# Patient Record
Sex: Female | Born: 1975 | Race: White | Hispanic: No | Smoking: Former smoker
Health system: Southern US, Community
[De-identification: ages and names within clinical notes are randomized; demographics above are authoritative.]

## PROBLEM LIST (undated history)

## (undated) DIAGNOSIS — Z803 Family history of malignant neoplasm of breast: Secondary | ICD-10-CM

## (undated) DIAGNOSIS — F3181 Bipolar II disorder: Secondary | ICD-10-CM

## (undated) DIAGNOSIS — K259 Gastric ulcer, unspecified as acute or chronic, without hemorrhage or perforation: Secondary | ICD-10-CM

## (undated) DIAGNOSIS — F32A Depression, unspecified: Secondary | ICD-10-CM

## (undated) DIAGNOSIS — J45909 Unspecified asthma, uncomplicated: Secondary | ICD-10-CM

## (undated) DIAGNOSIS — G43909 Migraine, unspecified, not intractable, without status migrainosus: Secondary | ICD-10-CM

## (undated) DIAGNOSIS — E236 Other disorders of pituitary gland: Secondary | ICD-10-CM

## (undated) DIAGNOSIS — Z8041 Family history of malignant neoplasm of ovary: Secondary | ICD-10-CM

## (undated) DIAGNOSIS — R569 Unspecified convulsions: Secondary | ICD-10-CM

## (undated) DIAGNOSIS — K760 Fatty (change of) liver, not elsewhere classified: Secondary | ICD-10-CM

## (undated) HISTORY — PX: CHOLECYSTECTOMY: SHX55

## (undated) HISTORY — DX: Family history of malignant neoplasm of ovary: Z80.41

## (undated) HISTORY — PX: ABDOMINAL HYSTERECTOMY: SHX81

## (undated) HISTORY — DX: Family history of malignant neoplasm of breast: Z80.3

---

## 1998-09-23 ENCOUNTER — Encounter: Payer: Self-pay | Admitting: Obstetrics and Gynecology

## 1998-09-23 ENCOUNTER — Inpatient Hospital Stay (HOSPITAL_COMMUNITY): Admission: AD | Admit: 1998-09-23 | Discharge: 1998-10-01 | Payer: Self-pay | Admitting: Obstetrics

## 1998-09-26 ENCOUNTER — Encounter: Payer: Self-pay | Admitting: Obstetrics and Gynecology

## 1998-09-27 ENCOUNTER — Encounter: Payer: Self-pay | Admitting: Obstetrics and Gynecology

## 1998-10-01 ENCOUNTER — Encounter (HOSPITAL_COMMUNITY): Admission: RE | Admit: 1998-10-01 | Discharge: 1998-12-30 | Payer: Self-pay | Admitting: Obstetrics and Gynecology

## 1998-11-17 ENCOUNTER — Other Ambulatory Visit: Admission: RE | Admit: 1998-11-17 | Discharge: 1998-11-17 | Payer: Self-pay | Admitting: Obstetrics and Gynecology

## 1999-12-11 ENCOUNTER — Ambulatory Visit (HOSPITAL_COMMUNITY): Admission: RE | Admit: 1999-12-11 | Discharge: 1999-12-11 | Payer: Self-pay | Admitting: Obstetrics and Gynecology

## 2000-08-16 ENCOUNTER — Other Ambulatory Visit: Admission: RE | Admit: 2000-08-16 | Discharge: 2000-08-16 | Payer: Self-pay | Admitting: Obstetrics and Gynecology

## 2001-09-25 ENCOUNTER — Encounter: Admission: RE | Admit: 2001-09-25 | Discharge: 2001-09-25 | Payer: Self-pay | Admitting: Obstetrics and Gynecology

## 2001-09-25 ENCOUNTER — Encounter: Payer: Self-pay | Admitting: Obstetrics and Gynecology

## 2004-06-10 ENCOUNTER — Encounter (INDEPENDENT_AMBULATORY_CARE_PROVIDER_SITE_OTHER): Payer: Self-pay | Admitting: Specialist

## 2004-06-10 ENCOUNTER — Observation Stay (HOSPITAL_COMMUNITY): Admission: RE | Admit: 2004-06-10 | Discharge: 2004-06-11 | Payer: Self-pay | Admitting: Obstetrics and Gynecology

## 2005-03-15 ENCOUNTER — Emergency Department: Payer: Self-pay | Admitting: Emergency Medicine

## 2005-04-26 ENCOUNTER — Emergency Department: Payer: Self-pay | Admitting: General Practice

## 2006-04-12 ENCOUNTER — Emergency Department: Payer: Self-pay | Admitting: Unknown Physician Specialty

## 2006-04-12 ENCOUNTER — Other Ambulatory Visit: Payer: Self-pay

## 2006-04-13 ENCOUNTER — Ambulatory Visit: Payer: Self-pay | Admitting: Emergency Medicine

## 2006-05-13 ENCOUNTER — Ambulatory Visit: Payer: Self-pay | Admitting: General Surgery

## 2006-11-14 ENCOUNTER — Ambulatory Visit: Payer: Self-pay | Admitting: Unknown Physician Specialty

## 2006-11-17 ENCOUNTER — Ambulatory Visit: Payer: Self-pay | Admitting: Unknown Physician Specialty

## 2007-07-23 ENCOUNTER — Emergency Department (HOSPITAL_COMMUNITY): Admission: EM | Admit: 2007-07-23 | Discharge: 2007-07-23 | Payer: Self-pay | Admitting: Emergency Medicine

## 2007-11-13 ENCOUNTER — Ambulatory Visit: Payer: Self-pay | Admitting: Internal Medicine

## 2007-12-21 ENCOUNTER — Emergency Department (HOSPITAL_COMMUNITY): Admission: EM | Admit: 2007-12-21 | Discharge: 2007-12-22 | Payer: Self-pay | Admitting: Emergency Medicine

## 2008-02-13 ENCOUNTER — Emergency Department (HOSPITAL_COMMUNITY): Admission: EM | Admit: 2008-02-13 | Discharge: 2008-02-14 | Payer: Self-pay | Admitting: Emergency Medicine

## 2008-03-21 ENCOUNTER — Emergency Department (HOSPITAL_COMMUNITY): Admission: EM | Admit: 2008-03-21 | Discharge: 2008-03-21 | Payer: Self-pay | Admitting: Emergency Medicine

## 2008-07-14 ENCOUNTER — Emergency Department (HOSPITAL_COMMUNITY): Admission: EM | Admit: 2008-07-14 | Discharge: 2008-07-14 | Payer: Self-pay | Admitting: Emergency Medicine

## 2008-10-16 ENCOUNTER — Ambulatory Visit: Payer: Self-pay | Admitting: Unknown Physician Specialty

## 2008-10-17 ENCOUNTER — Ambulatory Visit: Payer: Self-pay | Admitting: Unknown Physician Specialty

## 2008-11-21 ENCOUNTER — Emergency Department (HOSPITAL_COMMUNITY): Admission: EM | Admit: 2008-11-21 | Discharge: 2008-11-21 | Payer: Self-pay | Admitting: Family Medicine

## 2009-01-09 ENCOUNTER — Emergency Department: Payer: Self-pay | Admitting: Emergency Medicine

## 2009-01-23 ENCOUNTER — Ambulatory Visit: Payer: Self-pay | Admitting: Unknown Physician Specialty

## 2009-03-24 ENCOUNTER — Ambulatory Visit: Payer: Self-pay | Admitting: Family Medicine

## 2009-03-24 DIAGNOSIS — F509 Eating disorder, unspecified: Secondary | ICD-10-CM

## 2009-03-24 DIAGNOSIS — R5381 Other malaise: Secondary | ICD-10-CM

## 2009-03-24 DIAGNOSIS — G43909 Migraine, unspecified, not intractable, without status migrainosus: Secondary | ICD-10-CM | POA: Insufficient documentation

## 2009-03-24 DIAGNOSIS — F411 Generalized anxiety disorder: Secondary | ICD-10-CM | POA: Insufficient documentation

## 2009-03-24 DIAGNOSIS — K219 Gastro-esophageal reflux disease without esophagitis: Secondary | ICD-10-CM

## 2009-03-24 DIAGNOSIS — R5383 Other fatigue: Secondary | ICD-10-CM

## 2009-03-24 DIAGNOSIS — F329 Major depressive disorder, single episode, unspecified: Secondary | ICD-10-CM

## 2009-03-24 DIAGNOSIS — K259 Gastric ulcer, unspecified as acute or chronic, without hemorrhage or perforation: Secondary | ICD-10-CM | POA: Insufficient documentation

## 2009-03-24 LAB — CONVERTED CEMR LAB
ALT: 22 units/L (ref 0–35)
AST: 22 units/L (ref 0–37)
Alkaline Phosphatase: 45 units/L (ref 39–117)
Basophils Absolute: 0 10*3/uL (ref 0.0–0.1)
Eosinophils Absolute: 0.1 10*3/uL (ref 0.0–0.7)
HCT: 36.9 % (ref 36.0–46.0)
HDL: 46.4 mg/dL (ref >39.00)
LDL Cholesterol: 96 mg/dL (ref 0–99)
Lymphs Abs: 2 10*3/uL (ref 0.7–4.0)
MCV: 87.6 fL (ref 78.0–100.0)
Monocytes Absolute: 0.6 10*3/uL (ref 0.1–1.0)
Neutrophils Relative %: 51 % (ref 43.0–77.0)
Platelets: 171 10*3/uL (ref 150.0–400.0)
RDW: 11.9 % (ref 11.5–14.6)
Sodium: 145 meq/L (ref 135–145)
TSH: 0.97 microintl units/mL (ref 0.35–5.50)
Total Bilirubin: 0.5 mg/dL (ref 0.3–1.2)
Total CHOL/HDL Ratio: 3
Total Protein: 7.7 g/dL (ref 6.0–8.3)
VLDL: 5.6 mg/dL (ref 0.0–40.0)

## 2009-05-31 ENCOUNTER — Ambulatory Visit: Payer: Self-pay | Admitting: Family Medicine

## 2009-06-11 ENCOUNTER — Telehealth: Payer: Self-pay | Admitting: Family Medicine

## 2009-07-07 ENCOUNTER — Telehealth: Payer: Self-pay | Admitting: Family Medicine

## 2009-07-09 ENCOUNTER — Telehealth: Payer: Self-pay | Admitting: Family Medicine

## 2009-07-21 ENCOUNTER — Telehealth: Payer: Self-pay | Admitting: Family Medicine

## 2009-09-26 ENCOUNTER — Ambulatory Visit: Payer: Self-pay | Admitting: Family Medicine

## 2009-09-26 LAB — CONVERTED CEMR LAB: Rapid Strep: NEGATIVE

## 2009-10-20 ENCOUNTER — Telehealth: Payer: Self-pay | Admitting: Family Medicine

## 2009-11-18 ENCOUNTER — Telehealth: Payer: Self-pay | Admitting: Family Medicine

## 2009-11-19 ENCOUNTER — Ambulatory Visit: Payer: Self-pay | Admitting: Family Medicine

## 2009-11-21 ENCOUNTER — Telehealth: Payer: Self-pay | Admitting: Family Medicine

## 2009-11-24 ENCOUNTER — Ambulatory Visit: Payer: Self-pay | Admitting: Family Medicine

## 2009-12-02 ENCOUNTER — Encounter: Payer: Self-pay | Admitting: Family Medicine

## 2010-04-10 ENCOUNTER — Emergency Department (HOSPITAL_COMMUNITY): Admission: EM | Admit: 2010-04-10 | Discharge: 2010-04-10 | Payer: Self-pay | Admitting: Family Medicine

## 2010-04-13 ENCOUNTER — Ambulatory Visit: Payer: Self-pay | Admitting: Family Medicine

## 2010-04-22 ENCOUNTER — Ambulatory Visit: Payer: Self-pay | Admitting: Family Medicine

## 2010-04-23 ENCOUNTER — Ambulatory Visit: Payer: Self-pay | Admitting: Family Medicine

## 2010-04-23 ENCOUNTER — Other Ambulatory Visit: Admission: RE | Admit: 2010-04-23 | Discharge: 2010-04-23 | Payer: Self-pay | Admitting: Family Medicine

## 2010-04-23 LAB — CONVERTED CEMR LAB: Hep B C IgM: NEGATIVE

## 2010-04-24 ENCOUNTER — Encounter: Payer: Self-pay | Admitting: Family Medicine

## 2010-04-27 ENCOUNTER — Encounter (INDEPENDENT_AMBULATORY_CARE_PROVIDER_SITE_OTHER): Payer: Self-pay | Admitting: *Deleted

## 2010-04-28 ENCOUNTER — Telehealth: Payer: Self-pay | Admitting: Family Medicine

## 2010-04-29 ENCOUNTER — Ambulatory Visit: Payer: Self-pay | Admitting: Family Medicine

## 2010-04-30 ENCOUNTER — Telehealth: Payer: Self-pay | Admitting: Family Medicine

## 2010-05-14 ENCOUNTER — Telehealth: Payer: Self-pay | Admitting: Family Medicine

## 2010-06-18 ENCOUNTER — Telehealth: Payer: Self-pay | Admitting: Family Medicine

## 2010-06-23 ENCOUNTER — Encounter: Payer: Self-pay | Admitting: Family Medicine

## 2010-07-01 ENCOUNTER — Telehealth: Payer: Self-pay | Admitting: Family Medicine

## 2010-07-06 ENCOUNTER — Ambulatory Visit: Payer: Self-pay | Admitting: Internal Medicine

## 2010-07-06 ENCOUNTER — Encounter: Payer: Self-pay | Admitting: Family Medicine

## 2010-08-13 ENCOUNTER — Telehealth: Payer: Self-pay | Admitting: Family Medicine

## 2010-08-19 ENCOUNTER — Ambulatory Visit: Payer: Self-pay | Admitting: Family Medicine

## 2010-08-27 ENCOUNTER — Ambulatory Visit: Payer: Self-pay | Admitting: Family Medicine

## 2010-08-27 DIAGNOSIS — G4700 Insomnia, unspecified: Secondary | ICD-10-CM | POA: Insufficient documentation

## 2010-09-22 ENCOUNTER — Telehealth: Payer: Self-pay | Admitting: Family Medicine

## 2010-10-15 ENCOUNTER — Emergency Department: Payer: Self-pay | Admitting: Internal Medicine

## 2010-11-02 ENCOUNTER — Telehealth: Payer: Self-pay | Admitting: Family Medicine

## 2010-11-03 ENCOUNTER — Telehealth (INDEPENDENT_AMBULATORY_CARE_PROVIDER_SITE_OTHER): Payer: Self-pay | Admitting: *Deleted

## 2010-11-09 ENCOUNTER — Encounter: Payer: Self-pay | Admitting: Family Medicine

## 2010-11-09 ENCOUNTER — Telehealth (INDEPENDENT_AMBULATORY_CARE_PROVIDER_SITE_OTHER): Payer: Self-pay | Admitting: *Deleted

## 2010-11-09 ENCOUNTER — Ambulatory Visit: Admit: 2010-11-09 | Payer: Self-pay | Admitting: Family Medicine

## 2010-11-12 ENCOUNTER — Ambulatory Visit: Admit: 2010-11-12 | Payer: Self-pay | Admitting: Family Medicine

## 2010-11-17 ENCOUNTER — Telehealth (INDEPENDENT_AMBULATORY_CARE_PROVIDER_SITE_OTHER): Payer: Self-pay | Admitting: *Deleted

## 2010-11-17 ENCOUNTER — Telehealth: Payer: Self-pay | Admitting: Family Medicine

## 2010-11-18 ENCOUNTER — Ambulatory Visit: Payer: Self-pay | Admitting: Family Medicine

## 2010-11-18 DIAGNOSIS — Z0289 Encounter for other administrative examinations: Secondary | ICD-10-CM

## 2010-11-19 ENCOUNTER — Encounter: Payer: Self-pay | Admitting: Family Medicine

## 2010-11-19 ENCOUNTER — Ambulatory Visit: Payer: 59 | Admitting: Family Medicine

## 2010-11-19 DIAGNOSIS — F329 Major depressive disorder, single episode, unspecified: Secondary | ICD-10-CM

## 2010-11-19 DIAGNOSIS — M549 Dorsalgia, unspecified: Secondary | ICD-10-CM

## 2010-11-19 DIAGNOSIS — F411 Generalized anxiety disorder: Secondary | ICD-10-CM

## 2010-11-19 NOTE — Miscellaneous (Signed)
Summary: [facility]  [facility]   Imported By: Lanelle Bal 12/08/2009 13:30:08  _____________________________________________________________________  External Attachment:    Type:   Image     Comment:   External Document  Appended Document: [facility] Please fax to Dr. Glean Hess from [facility] who appears to be the ordering physician  Appended Document: [facility] done faxed to [facility]

## 2010-11-19 NOTE — Progress Notes (Signed)
Summary: refill  Phone Note Refill Request Message from:  Scriptline on June 18, 2010 8:14 AM  Refills Requested: Medication #1:  AMITRIPTYLINE HCL 100 MG TABS 1 by mouth at bedtime   [facility]ly Requested[facility] [address]   Method Requested: Electronic Initial call taken by: Benny Lennert CMA (AAMA),  June 18, 2010 8:15 AM    Prescriptions: AMITRIPTYLINE HCL 100 MG TABS (AMITRIPTYLINE HCL) 1 by mouth at bedtime  #30 x 5   Entered and Authorized by:   Hannah Beat MD   Signed by:   Hannah Beat MD on 06/18/2010   Method used:   [facility]  [address] [phone][facility] (retail[facility]       [address]       [place], [place]  [address]       Ph: [phone] or [phone]       Fax: [phone]   RxID:   [id number]
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
## 2010-11-19 NOTE — Progress Notes (Signed)
Summary: refill reqeust for valtrex  Phone Note Refill Request Call back at Home Phone [phone] Message from:  Patient on November 02, 2010 10:05 AM  Refills Requested: Medication #1:  VALACYCLOVIR HCL 1 GM TABS 1 by mouth two times a day [facility] [address]   Initial call taken by: Melody Comas,  November 02, 2010 10:05 AM    Prescriptions: VALACYCLOVIR HCL 1 GM TABS (VALACYCLOVIR HCL) 1 by mouth two times a day  #20 x 0   Entered by:   Benny Lennert CMA (AAMA)   Authorized by:   Kerby Nora MD   Signed by:   Benny Lennert CMA (AAMA) on 11/02/2010   Method used:   Faxed to ...       [facility] (retail)       [address]       [place], [place]    [place]       Ph: [phone]       Fax: [phone]   RxID:   [id number] VALACYCLOVIR HCL 1 GM TABS (VALACYCLOVIR HCL) 1 by mouth two times a day  #20 x 0   Entered by:   Benny Lennert CMA (AAMA)   Authorized by:   Kerby Nora MD   Signed by:   Benny Lennert CMA (AAMA) on 11/02/2010   Method used:   [facility]        [facility]  [address] [phone][facility] (retail)       [address]       [place], [place]  [address]       Ph: [phone] or [phone]       Fax: [phone]   RxID:   [id number]

## 2010-11-19 NOTE — Letter (Signed)
Summary: Out of Work  [employer] [employer] [facility]  [address] [place], [place] [address]   Phone: [phone]  Fax: [phone]    July 06, 2010   Employee:  Dhanvi N Gilkes    To Whom It May Concern:   For Medical reasons, please excuse the above named employee from work for the following dates:  Start:  July 06, 2010   End:  July 06, 2010   If you need additional information, please feel free to contact our office.         Sincerely,    Eustaquio Boyden  MD

## 2010-11-19 NOTE — Progress Notes (Signed)
Summary: pt requests another phone call  Phone Note Call from Patient Call back at [phone]   Caller: Patient Call For: Hannah Beat MD Summary of Call: Pt is asking if you were going to call anything in to the pharmacy for her.  Says she is in a lot of pain, please call pt again to discuss. Initial call taken by: Lowella Petties CMA,  April 28, 2010 9:35 AM  Follow-up for Phone Call        We can try some Valtrex -- it may be too late since the onset of the outbreak was a number of days ago, but it is worth a shot.  Follow-up by: Hannah Beat MD,  April 28, 2010 9:39 AM  Additional Follow-up for Phone Call Additional follow up Details #1::        Patient advised via voicemail.Consuello Masse CMA   Additional Follow-up by: Benny Lennert CMA Duncan Dull),  April 28, 2010 9:54 AM    New/Updated Medications: VALACYCLOVIR HCL 1 GM TABS (VALACYCLOVIR HCL) 1 by mouth two times a day Prescriptions: VALACYCLOVIR HCL 1 GM TABS (VALACYCLOVIR HCL) 1 by mouth two times a day  #20 x 0   Entered and Authorized by:   Hannah Beat MD   Signed by:   Hannah Beat MD on 04/28/2010   Method used:   Telephoned to ...       [facility]  [address] [phone][facility] (retail)       [address]       [place], [place]  [address]       Ph: [phone] or [phone]       Fax: [phone]   RxID:   [id number]

## 2010-11-19 NOTE — Progress Notes (Signed)
Summary: clonazepam not helping  Phone Note Call from Patient Call back at [facility] Phone [phone]   Caller: Patient Call For: Hannah Beat MD Summary of Call: Patient says that the clonazepam is not helping her at all, she says that she can't even tell that she is taking anything. She is asking if she can get something else called [facility] [address]  Initial call taken by: Melody Comas,  November 09, 2010 10:00 AM  Follow-up for Phone Call        please have the patient follow-up with me in the office. It is ok to take 2 by mouth two times a day for now Follow-up by: Hannah Beat MD,  November 09, 2010 10:03 AM  Additional Follow-up for Phone Call Additional follow up Details #1::        Patient advised via message on machine.Consuello Masse CMA   Additional Follow-up by: Benny Lennert CMA Duncan Dull),  November 09, 2010 10:07 AM

## 2010-11-19 NOTE — Progress Notes (Signed)
Summary: Tingling in hands and face, dizziness  Phone Note Call from Patient Call back at [phone]   Caller: Patient Call For: Dr Ermalene Searing Summary of Call: 11/13/09 had N&V  Now keeping food down on and off since [place]. Pt is drinking normally.11/16/09 pt started with dizziness and today still dizzy with tingling in face and hands also h/a. Pt has no chest pain and no fever. Pt wanted an appt for today and did not want to consider an appt for tomorrow unless a physician said was OK. Dr. Ermalene Searing said with medication list pt is on and pt's age and pt can tolerate liquids Dr. Ermalene Searing said pt would be OK to wait until tomorrow to be seen. Pt has appt 11/19/09 at 12noon with Dr Dayton Martes and pt will call if condition changes or worsens. Initial call taken by: Melody Comas,  November 18, 2009 2:14 PM

## 2010-11-19 NOTE — Progress Notes (Signed)
Summary: refill request for tussionex  Phone Note Refill Request Message from:  Fax from Pharmacy  Refills Requested: Medication #1:  Sandria Senter ER 8-10 MG/5ML LQCR 1 tsp at bedtime for cough as needed. [facility] [address], pt requests generic.  Initial call taken by: Lowella Petties CMA,  October 20, 2009 9:07 AM  Follow-up for Phone Call        If she is not improving..she needs reeval in OV...if only slow to resolve cough and NO SOB...can refill x 1 Follow-up by: Kerby Nora MD,  October 20, 2009 9:08 AM  Additional Follow-up for Phone Call Additional follow up Details #1::        unable to contact patient but will advise if she returns my phone calls Additional Follow-up by: Benny Lennert CMA (AAMA),  October 20, 2009 11:22 AM
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
## 2010-11-19 NOTE — Letter (Signed)
Summary: Results Follow up Letter  [place] [facility]  [address] [place], [place] [address]   Phone: [phone]  Fax: [phone]    04/27/2010 MRN: [id number]     Andrea Hood [address] [place], [place]  [address]  Dear Ms. Grandison,  The following are the results of your recent test(s):    Test         Result    Pap Smear:        Normal __x___  Not Normal _____ Comments:Repeat in one year ______________________________________________________ Cholesterol: LDL(Bad cholesterol):         Your goal is less than:         HDL (Good cholesterol):       Your goal is more than: Comments:  ______________________________________________________ Mammogram:        Normal _____  Not Normal _____ Comments:  ___________________________________________________________________ Hemoccult:        Normal _____  Not normal _______ Comments:    _____________________________________________________________________ Other Tests:Still waiting for these to come back    We routinely do not discuss normal results over the telephone.  If you desire a copy of the results, or you have any questions about this information we can discuss them at your next office visit.   Sincerely,  Hannah Beat MD

## 2010-11-19 NOTE — Progress Notes (Signed)
Summary: pt left cell number  Phone Note Call from Patient   Caller: Patient Call For: Hannah Beat MD Summary of Call: Pt was told to call today and leave her cell phone number  so that you can call her from home.  That number is [phone]. Initial call taken by: Lowella Petties CMA,  April 28, 2010 9:11 AM  Follow-up for Phone Call        d/w her on the phone Follow-up by: Hannah Beat MD,  April 28, 2010 9:18 AM     Past Medical History:    Reviewed history from 03/24/2009 and no changes required:       MIGRAINE HEADACHE        GASTRIC ULCER, h/o       GERD        EATING DISORDER, UNSPECIFIED (h/o Bulimia several years ago)       DEPRESSION       ANEMIA       Anxiety       HSV 2 positive
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
## 2010-11-19 NOTE — Assessment & Plan Note (Signed)
Summary: TINGLING FACE & HANDS,DIZZY PER DR BEDSOLE/RI   Vital Signs:  Patient profile:   35 year old female Height:      64 inches Weight:      141.25 pounds BMI:     24.33 Temp:     98.2 degrees F oral Pulse rate:   80 / minute Pulse rhythm:   regular BP sitting:   102 / 80  (left arm) Cuff size:   regular  Vitals Entered By: Delilah Shan CMA Duncan Dull) (November 19, 2009 11:53 AM) CC: Tingling in face and hands.  Dizzy   History of Present Illness: 35 yo here with dizziness.  Started vomiting on Thursday, had body aches.  Vomitted several times a day until Monday.  Now has very watery diarrhea.  Trying to eat and drink but appetite is low. Noticed yesterday when she tried to stand up from seated positiong, she felt dizzy and tingling in her lips and hands.  Never passed out. Still a little nauseated but better. No blood in stool or emesis. Mutliple people at work have similar symptoms.  Current Medications (verified): 1)  Citalopram Hydrobromide 10 Mg Tabs (Citalopram Hydrobromide) .Marland Kitchen.. 1 By Mouth Daily 2)  Metaxalone 800 Mg Tabs (Metaxalone) .... Take 1 Tablet By Mouth Every Six To Eight Hours As Needed For Muscle Spasm. 3)  Promethazine Hcl 25 Mg Tabs (Promethazine Hcl) .Marland Kitchen.. 1 Tab Every 6 Hours As Needed Nausea.  Allergies (verified): No Known Drug Allergies  Review of Systems      See HPI General:  Complains of chills; denies fever. CV:  Denies chest pain or discomfort. Resp:  Denies shortness of breath. GI:  Complains of diarrhea, nausea, and vomiting; denies abdominal pain, bloody stools, and vomiting blood. Derm:  Denies rash. Neuro:  Complains of tingling; denies tremors, visual disturbances, and weakness.  Physical Exam  General:  alert, well-developed, NAD.  Not toxic appearing. Mouth:  MMM   Lungs:    CTA bilaterally Heart:  Normal rate and regular rhythm. S1 and S2 normal without gallop, murmur, click, rub or other extra sounds. Abdomen:  soft, mild  diffuse tenderness, pos BS Psych:  normally interactive, flat affect, and subdued.     Impression & Recommendations:  Problem # 1:  HYPOTENSION, ORTHOSTATIC (ICD-458.0) Assessment New Likely secondary to dehydration from acute gastroenteritis.  Hydate with fluids, phenergan as needed nausea.  Complete Medication List: 1)  Citalopram Hydrobromide 10 Mg Tabs (Citalopram hydrobromide) .Marland Kitchen.. 1 by mouth daily 2)  Metaxalone 800 Mg Tabs (Metaxalone) .... Take 1 tablet by mouth every six to eight hours as needed for muscle spasm. 3)  Promethazine Hcl 25 Mg Tabs (Promethazine hcl) .Marland Kitchen.. 1 tab every 6 hours as needed nausea.  Patient Instructions: 1)  The main problem with gastroentereritis is dehydration. Drink plenty of fluids and take solids as you feel better. If you are unable to keep anything down and/or you show signs of dehydration( dry cracked lips, lack of tears, not urinating, very sleepy) , call our office.  Prescriptions: PROMETHAZINE HCL 25 MG TABS (PROMETHAZINE HCL) 1 tab every 6 hours as needed nausea.  #30 x 0   Entered and Authorized by:   Ruthe Mannan MD   Signed by:   Ruthe Mannan MD on 11/19/2009   Method used:   [facility]  [address] [phone]* (retail)       [address]       [place], [place]  [address]       Ph: [phone] or [phone]       Fax: [phone]   RxID:   [id number]   Current Allergies (reviewed today): No known allergies

## 2010-11-19 NOTE — Progress Notes (Signed)
Summary: lost script for amitriptyline  Phone Note Refill Request Call back at Home Phone [phone] Message from:  Patient  Refills Requested: Medication #1:  AMITRIPTYLINE HCL 100 MG TABS 1 by mouth at bedtime Pt was given a written script at her 6/27 office visit, but she says she has lost it.  [facility] [address]  Please let pt know when called in.  Initial call taken by: Lowella Petties CMA,  May 14, 2010 9:39 AM  Follow-up for Phone Call        Rx called to pharmacy,patient advised.Consuello Masse CMA   Follow-up by: Benny Lennert CMA Duncan Dull),  May 14, 2010 10:09 AM    Prescriptions: AMITRIPTYLINE HCL 100 MG TABS (AMITRIPTYLINE HCL) 1 by mouth at bedtime  #30 x 0   Entered and Authorized by:   Hannah Beat MD   Signed by:   Hannah Beat MD on 05/14/2010   Method used:   [facility]        [facility]  [address] [phone]* (retail)       [address]       [place], [place]  [id number]       Ph: [phone] or [phone]       Fax: [phone]   RxID:   [id number]

## 2010-11-19 NOTE — Progress Notes (Signed)
Summary: Remus Loffler   Phone Note Refill Request Message from:  Fax from Pharmacy on September 22, 2010 1:06 PM  Refills Requested: Medication #1:  ZOLPIDEM TARTRATE 10 MG  TABS 1/2 or 1 by mouth at hs prn.   Last Refilled: 08/27/2010 Refill request from [facility] [address] [phone]  Initial call taken by: Melody Comas,  September 22, 2010 1:06 PM  Follow-up for Phone Call        Await PCP. Follow-up by: Kerby Nora MD,  September 22, 2010 1:57 PM  Additional Follow-up for Phone Call Additional follow up Details #1::        Rx called to pharmacy Additional Follow-up by: Linde Gillis CMA Duncan Dull),  September 22, 2010 4:09 PM    Prescriptions: ZOLPIDEM TARTRATE 10 MG  TABS (ZOLPIDEM TARTRATE) 1/2 or 1 by mouth at hs prn  #30 x 1   Entered and Authorized by:   Hannah Beat MD   Signed by:   Hannah Beat MD on 09/22/2010   Method used:   Telephoned to ...       [facility]  [address] [phone]* (retail)       [address]       [place], [place]  [address]       Ph: [phone] or [phone]       Fax: [phone]   RxID:   [id number]

## 2010-11-19 NOTE — Progress Notes (Signed)
Summary: refill request for valtrex  Phone Note Refill Request Message from:  Patient  Refills Requested: Medication #1:  VALACYCLOVIR HCL 1 GM TABS 1 by mouth two times a day Phoned request from pt, please send to [facility] [address].  Initial call taken by: Lowella Petties CMA,  July 01, 2010 2:40 PM    Prescriptions: VALACYCLOVIR HCL 1 GM TABS (VALACYCLOVIR HCL) 1 by mouth two times a day  #20 x 0   Entered and Authorized by:   Kerby Nora MD   Signed by:   Kerby Nora MD on 07/01/2010   Method used:   Electronically to        [facility]  [address] [phone]* (retail)       [address]       [place], [place]  [address]       Ph: [phone] or [phone]       Fax: [phone]   RxID:   [id number]

## 2010-11-19 NOTE — Progress Notes (Signed)
Summary: pt update  Phone Note Call from Patient Call back at [phone]   Caller: Patient Call For: Dr Dayton Martes Summary of Call: Saw Dr Dayton Martes on 11/19/09.Having nausea and watery diarrhea since 11/17/09(pt has diarrhea 3-4 times each day). Phenergan has stopped the vomiting.Pt is drinking 15 twenty oz glasses of Gatorade a day. Pt still cannot tolerate solid food.  Pt is sleepy during the day, not urinating as often as thinks she should be due to amt pt drinking and pt's lips are dry but not cracked. Pt said Dr Dayton Martes told her not to take med for diarrhea. [facility] [address] [phone]. Please advise.  Initial call taken by: Lewanda Rife LPN,  November 21, 2009 10:54 AM  Follow-up for Phone Call        Continue drinking fluids.  Sounds like normal course of gastroenteritis.  Try eating soft bland foods when she can tolerate. Follow-up by: Ruthe Mannan MD,  November 21, 2009 10:58 AM  Additional Follow-up for Phone Call Additional follow up Details #1::        Patient Advised.  Additional Follow-up by: Delilah Shan CMA Duncan Dull),  November 21, 2009 12:57 PM

## 2010-11-19 NOTE — Progress Notes (Signed)
Summary: wants to change from amitriptyline  Phone Note Call from Patient Call back at Home Phone [phone]   Caller: Patient Call For: Andrea Beat MD Summary of Call: Pt has been taking amitriptyline for awhile but she says this is no longer working- she is getting tightness in her chest and over eats when she stresses.   She says she feels worse now than she did before the amitriptyline.  [facility]. Initial call taken by: Lowella Petties CMA, AAMA,  August 13, 2010 2:39 PM  Follow-up for Phone Call        this is a complex case and needs follow-up discussion in the office. Spencer Copland MD  August 13, 2010 2:50 PM   [facility] for pt to call back.   Lowella Petties CMA, AAMA  August 13, 2010 3:10 PM   Additional Follow-up for Phone Call Additional follow up Details #1::        Pt made appt. Additional Follow-up by: Lowella Petties CMA, AAMA,  August 13, 2010 3:42 PM

## 2010-11-19 NOTE — Progress Notes (Signed)
Summary: call a nurse fax   Phone Note Call from Patient   Caller: call a nurse fax Call For: Hannah Beat MD Summary of Call: Received a fax from call a nurse stating that patient was throwing up last night and still had a migraine. At the time she had still not started her imatrex, nurse advised her to start imatrex and to go to ER for worsening symptoms.  Initial call taken by: Melody Comas,  April 30, 2010 9:20 AM  Follow-up for Phone Call        Call to check on her -- I would fill the imitrex and try  OK to call her in some phenergan, too. 25 mg by mouth, 1 by mouth q 6 hours as needed nausea, #30. 0 refills  ok to crush and put in applesauce if feeling sick Follow-up by: Hannah Beat MD,  April 30, 2010 10:17 AM  Additional Follow-up for Phone Call Additional follow up Details #1::        Patient is feeling better headache is gone now Additional Follow-up by: Benny Lennert CMA Duncan Dull),  April 30, 2010 10:35 AM

## 2010-11-19 NOTE — Progress Notes (Signed)
Summary: wants something for stress   Phone Note Call from Patient Call back at Home Phone [phone]   Caller: Patient Call For: Andrea Beat MD Summary of Call: Patient says that she is going through alot of stress at work, having trouble with her daughter, her mom recently passed away. She is asking if she can get something to help calm her nerves. Uses [facility] [address]  Initial call taken by: Melody Comas,  November 03, 2010 8:38 AM  Follow-up for Phone Call        Sounds reasonable for temporary alprazolapm prescription with close follow up ..but given controlled substance... Copland in town.. will forward to him for review. He will be back on Wed as well.  Follow-up by: Kerby Nora MD,  November 03, 2010 9:05 AM  Additional Follow-up for Phone Call Additional follow up Details #1::        Klonopin 0.5 mg, 1 by mouth two times a day prn anxiety, #20, 0 refills call in, add to chart  can you have her follow-up with me in the next couple of weeks. Additional Follow-up by: Andrea Beat MD,  November 03, 2010 1:26 PM    Additional Follow-up for Phone Call Additional follow up Details #2::    Patient advised via message on personal voice mail that medication called in and that she needs to schedule a follow up with dr copland Follow-up by: Benny Lennert CMA Duncan Dull),  November 03, 2010 2:15 PM  New/Updated Medications: KLONOPIN 0.5 MG TABS (CLONAZEPAM) take one tablet twice  daily as needed

## 2010-11-19 NOTE — Progress Notes (Signed)
Summary: needs refill on klonopin  Phone Note Refill Request Message from:  Patient  Refills Requested: Medication #1:  KLONOPIN 0.5 MG TABS take one tablet twice  daily as needed. Pt was told to increase her dose to 2 two times a day but she says she has already been doing that, increased dose on friday. She needs a refill called to [facility] [address]  She said she will make appt to see you as soon as she can.  Initial call taken by: Lowella Petties CMA, AAMA,  November 09, 2010 11:23 AM  Follow-up for Phone Call        I am going to switch to Ativan.  Make this patient an appointment. No discussion. Follow-up by: Hannah Beat MD,  November 09, 2010 12:06 PM  Additional Follow-up for Phone Call Additional follow up Details #1::        Left message and told her to call and schedule appt before medication can be called in.Consuello Masse CMA   Additional Follow-up by: Benny Lennert CMA Duncan Dull),  November 09, 2010 12:21 PM    Additional Follow-up for Phone Call Additional follow up Details #2::    PATIENT SCHEDULED APPT UP FRONT AND I CALLED MEDICATION IN TO PHARMACY.Consuello Masse CMA   Follow-up by: Benny Lennert CMA Duncan Dull),  November 09, 2010 12:47 PM  New/Updated Medications: LORAZEPAM 1 MG  TABS (LORAZEPAM) 1 by mouth three times a day as needed anxiety Prescriptions: LORAZEPAM 1 MG  TABS (LORAZEPAM) 1 by mouth three times a day as needed anxiety  #40 x 0   Entered and Authorized by:   Hannah Beat MD   Signed by:   Hannah Beat MD on 11/09/2010   Method used:   Telephoned to ...       [facility]  [address] [phone]* (retail)       [address]       [place], [place]  [address]       Ph: [phone] or [phone]       Fax: [phone]   RxID:   [id number]

## 2010-11-25 NOTE — Medication Information (Signed)
Summary: Patient Rx History Report  Patient Rx History Report   Imported By: Beau Fanny 11/18/2010 14:37:20  _____________________________________________________________________  External Attachment:    Type:   Image     Comment:   External Document

## 2010-11-25 NOTE — Progress Notes (Signed)
Summary: ativan  Phone Note Refill Request Message from:  [employer]line on November 17, 2010 2:28 PM  Refills Requested: Medication #1:  LORAZEPAM 1 MG  TABS 1 by mouth three times a day as needed anxiety.   Supply Requested: 1 month   [facility]   Method Requested: Telephone to Pharmacy Initial call taken by: Benny Lennert CMA Duncan Dull),  November 17, 2010 2:29 PM  Follow-up for Phone Call        denied. i am seeing in office tomorrow. Hannah Beat MD  November 17, 2010 2:53 PM   Additional Follow-up for Phone Call Additional follow up Details #1::        Patient advised.Consuello Masse CMA   Additional Follow-up by: Benny Lennert CMA Duncan Dull),  November 17, 2010 3:06 PM

## 2010-11-25 NOTE — Progress Notes (Signed)
Summary: zolpidem tartrate   Phone Note Refill Request Message from:  Fax from Pharmacy on November 17, 2010 3:29 PM  Refills Requested: Medication #1:  ZOLPIDEM TARTRATE 10 MG  TABS 1/2 or 1 by mouth at hs prn   Last Refilled: 10/19/2010 [facility] [address] [phone]  Initial call taken by: Melody Comas,  November 17, 2010 3:30 PM  Follow-up for Phone Call        rx called to pharmacy.Consuello Masse CMA   Follow-up by: Benny Lennert CMA Duncan Dull),  November 17, 2010 4:02 PM    Prescriptions: ZOLPIDEM TARTRATE 10 MG  TABS (ZOLPIDEM TARTRATE) 1/2 or 1 by mouth at hs prn  #30 x 1   Entered and Authorized by:   Hannah Beat MD   Signed by:   Hannah Beat MD on 11/17/2010   Method used:   [facility]phoned to ...       [facility]  [address] [phone][facility] (retail)       [address]       [place], [place]  [address]       Ph: [phone] or [phone]       Fax: [phone]   RxID:   [id number]
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
## 2010-12-03 ENCOUNTER — Telehealth: Payer: Self-pay | Admitting: Family Medicine

## 2010-12-09 NOTE — Progress Notes (Signed)
Summary: requesting pain medication for back  Phone Note Call from Patient   Caller: Patient Call For: Andrea Beat MD Summary of Call: Patient calling and says the muscle relaxers and anti inflammatory you gave her is not working for her back pain. Patient wants to know if you can call her in medication for pain.Consuello Masse CMA   Initial call taken by: Benny Lennert CMA Duncan Dull),  December 03, 2010 2:10 PM  Follow-up for Phone Call        known well, acute back pain with prior history.  call in tramadol, would use as needed for pain, would continue with muscle relaxer and antiinflammatory. try to move and get some exercise - will help this and depression.  vicodin for severe pain - will make drowsy. no driving.  she should be f/u with me in 2-3 weeks i believe and i will recheck. Follow-up by: Andrea Beat MD,  December 04, 2010 10:00 AM  Additional Follow-up for Phone Call Additional follow up Details #1::        Advised pt.  [facility] [place].  Follow up appt made. Additional Follow-up by: Lowella Petties CMA, AAMA,  December 04, 2010 11:51 AM    New/Updated Medications: TRAMADOL HCL 50 MG TABS (TRAMADOL HCL) 1 by mouth 4 times daily as needed pain HYDROCODONE-ACETAMINOPHEN 5-500 MG TABS (HYDROCODONE-ACETAMINOPHEN) 1 by mouth q 6 hours as needed severe pain Prescriptions: HYDROCODONE-ACETAMINOPHEN 5-500 MG TABS (HYDROCODONE-ACETAMINOPHEN) 1 by mouth q 6 hours as needed severe pain  #30 x 0   Entered and Authorized by:   Andrea Beat MD   Signed by:   Andrea Beat MD on 12/04/2010   Method used:   [facility]phoned to ...       [facility] [phone][facility] (retail[facility]       [address]       [place], [place]  [address]       Ph: [phone] or [phone]       Fax: [phone]   RxID:   [id number] TRAMADOL HCL 50 MG TABS (TRAMADOL HCL) 1 by mouth 4 times daily as needed pain  #50 x 0   Entered and  Authorized by:   Andrea Beat MD   Signed by:   Andrea Beat MD on 12/04/2010   Method used:   Telephoned to ...       [facility]  [address] [phone]* (retail)       [address]       [place], [place]  [address]       Ph: [phone] or [phone]       Fax: [phone]   RxID:   [id number]

## 2010-12-14 ENCOUNTER — Telehealth: Payer: Self-pay | Admitting: Family Medicine

## 2010-12-17 ENCOUNTER — Telehealth: Payer: Self-pay | Admitting: Family Medicine

## 2010-12-21 ENCOUNTER — Telehealth: Payer: Self-pay | Admitting: Family Medicine

## 2010-12-23 ENCOUNTER — Ambulatory Visit: Payer: 59 | Admitting: Family Medicine

## 2010-12-23 DIAGNOSIS — Z0289 Encounter for other administrative examinations: Secondary | ICD-10-CM

## 2010-12-24 NOTE — Progress Notes (Signed)
Summary: vicodin  Phone Note Refill Request Message from:  [employer]line on December 14, 2010 1:02 PM  Refills Requested: Medication #1:  HYDROCODONE-ACETAMINOPHEN 5-500 MG TABS 1 by mouth q 6 hours as needed severe pain.   Supply Requested: [facility] [address]   Method Requested: Telephone to Pharmacy Initial call taken by: Benny Lennert CMA Duncan Dull),  December 14, 2010 1:02 PM  Follow-up for Phone Call        make sure she has f/u appt scheduled. Follow-up by: Hannah Beat MD,  December 14, 2010 3:14 PM  Additional Follow-up for Phone Call Additional follow up Details #1::        Rx called to pharmacy and patient does have follow up appt on march 7th Additional Follow-up by: Benny Lennert CMA Duncan Dull),  December 14, 2010 4:05 PM    Prescriptions: HYDROCODONE-ACETAMINOPHEN 5-500 MG TABS (HYDROCODONE-ACETAMINOPHEN) 1 by mouth q 6 hours as needed severe pain  #30 x 0   Entered and Authorized by:   Hannah Beat MD   Signed by:   Hannah Beat MD on 12/14/2010   Method used:   Telephoned to ...       [facility] [phone]* (retail)       [address]       [place], [place]  [address]       Ph: [phone] or [phone]       Fax: [phone]   RxID:   [id number]

## 2010-12-24 NOTE — Progress Notes (Signed)
Summary: tramadol  Phone Note Refill Request Message from:  [employer] on December 17, 2010 7:31 AM  Refills Requested: Medication #1:  TRAMADOL HCL 50 MG TABS 1 by mouth 4 times daily as needed pain   Supply Requested: [facility] month [facility]   Method Requested: Electronic Initial call taken by: Benny Lennert CMA Duncan Dull),  December 17, 2010 7:31 AM  Follow-up for Phone Call        [facility]. Hannah Beat MD  December 17, 2010 8:05 AM   Additional Follow-up for Phone Call Additional follow up Details #1::        Pharmacy advised to cancel tramadol rx.Consuello Masse CMA   Additional Follow-up by: Benny Lennert CMA (AAMA),  December 17, 2010 9:00 AM    Prescriptions: TRAMADOL HCL 50 MG TABS (TRAMADOL HCL) 1 by mouth 4 times daily as needed pain  #50 x 0   Entered and Authorized by:   Hannah Beat MD   Signed by:   Hannah Beat MD on 12/17/2010   Method used:   Faxed to ...       [facility] (retail)       [address]       [place], [place]    [place]       Ph: [phone]       Fax: [phone]   RxID:   [id number] TRAMADOL HCL 50 MG TABS (TRAMADOL HCL) 1 by mouth 4 times daily as needed pain  #50 x 0   Entered and Authorized by:   Hannah Beat MD   Signed by:   Hannah Beat MD on 12/17/2010   Method used:   [facility]        [facility] [address] [phone]* (retail)       [address]       [place], [place]  [address]       Ph: [phone] or [phone]       Fax: [phone]   RxID:   [id number]

## 2010-12-28 ENCOUNTER — Telehealth (INDEPENDENT_AMBULATORY_CARE_PROVIDER_SITE_OTHER): Payer: Self-pay | Admitting: *Deleted
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
## 2011-01-05 ENCOUNTER — Other Ambulatory Visit: Payer: Self-pay | Admitting: Family Medicine

## 2011-01-05 NOTE — Progress Notes (Signed)
Summary: refill request for vicodin  Phone Note Refill Request Message from:  Patient  Refills Requested: Medication #1:  HYDROCODONE-ACETAMINOPHEN 5-500 MG TABS 1 by mouth q 6 hours as needed severe pain Pt states it is time for a refill, [facility] [address]  Initial call taken by: Lowella Petties CMA, AAMA,  December 28, 2010 12:28 PM  Follow-up for Phone Call        long conversation with her on the phone last week. back pain is not doing well. she is applying for medicaid, and for the next 1-2 months, i will cover her care until established with new PCP, but not beyond that time frame.  call in. Follow-up by: Hannah Beat MD,  December 28, 2010 5:58 PM  Additional Follow-up for Phone Call Additional follow up Details #1::        Rx called to pharmacy.Consuello Masse CMA   Additional Follow-up by: Benny Lennert CMA Duncan Dull),  December 29, 2010 9:24 AM    Prescriptions: HYDROCODONE-ACETAMINOPHEN 5-500 MG TABS (HYDROCODONE-ACETAMINOPHEN) 1 by mouth q 6 hours as needed severe pain  #45 x 0   Entered and Authorized by:   Hannah Beat MD   Signed by:   Hannah Beat MD on 12/28/2010   Method used:   Telephoned to ...       [facility] (retail)       [address]       [place], [place]    [place]       Ph: [phone]       Fax: [phone]   RxID:   [id number]

## 2011-01-11 ENCOUNTER — Other Ambulatory Visit: Payer: Self-pay | Admitting: Family Medicine

## 2011-01-11 NOTE — Telephone Encounter (Signed)
Ok to refill #45  Recent long phone encounter with patient.

## 2011-01-18 ENCOUNTER — Other Ambulatory Visit: Payer: Self-pay | Admitting: Family Medicine

## 2011-01-21 ENCOUNTER — Other Ambulatory Visit: Payer: Self-pay | Admitting: Family Medicine

## 2011-01-21 NOTE — Telephone Encounter (Signed)
Patient advised via message on machine 

## 2011-01-21 NOTE — Telephone Encounter (Signed)
01/11/2011 she refilled 45 Vicodin 7.5 mg tabs.  Too early, she is taking too much pain medication. Refill denied. We talked last on the phone, and I know she is planning on finding a new MD to manage multiple problems. She really needs to take steps to do this.  I will continue to assist for 1 more month while she is in this transition.

## 2011-01-25 ENCOUNTER — Other Ambulatory Visit: Payer: Self-pay | Admitting: *Deleted

## 2011-01-25 MED ORDER — HYDROCODONE-ACETAMINOPHEN 5-500 MG PO TABS: 1.0000 | ORAL_TABLET | Freq: Four times a day (QID) | ORAL | Status: DC | PRN

## 2011-01-25 NOTE — Telephone Encounter (Signed)
Ok, #45. No more refills.

## 2011-01-25 NOTE — Telephone Encounter (Signed)
rx sent to pharmacy

## 2011-02-03 ENCOUNTER — Other Ambulatory Visit: Payer: Self-pay | Admitting: Family Medicine

## 2011-02-07 ENCOUNTER — Other Ambulatory Visit: Payer: Self-pay | Admitting: Family Medicine

## 2011-02-08 ENCOUNTER — Telehealth: Payer: Self-pay | Admitting: *Deleted

## 2011-02-08 NOTE — Telephone Encounter (Signed)
Ok to refill Klonopin #60, 0 refills  Vicodin denied, too early  She needs f/u and to establish care with new MD as we have discussed. The time period where I will assist with her management without face to face contact is ending.

## 2011-02-08 NOTE — Telephone Encounter (Signed)
Patient advised as instructed and rx called to pharmacy

## 2011-02-08 NOTE — Telephone Encounter (Signed)
Pt states that she has just gotten her medicaid card and will be changing doctors but she is asking if you will prescribe another 15 days on her vicodin.  You refilled for 15 days, but she is asking for enough to last a month. If ok, [facility] [address]

## 2011-02-09 NOTE — Telephone Encounter (Signed)
I think that this is reasonable to tide over for the next couple of weeks. (and last time)  Refill Vicodin #60, 0 refills Call in  Should prob call the MD on her card ASAP to establish   I wish her the best in the future.

## 2011-02-15 ENCOUNTER — Other Ambulatory Visit: Payer: Self-pay | Admitting: Family Medicine

## 2011-02-22 ENCOUNTER — Other Ambulatory Visit: Payer: Self-pay | Admitting: Family Medicine

## 2011-03-05 NOTE — Op Note (Signed)
[facility]  Patient:    Andrea Hood, Andrea Hood                      MRN: [id number] Proc. Date: 12/11/99 Adm. Date:  [id number] Disc. Date: [id number] Attending:  Cordelia Pen Ii                           Operative Report  PREOPERATIVE DIAGNOSIS: 1. Endometriosis. 2. Desires permanent sterilization.  POSTOPERATIVE DIAGNOSIS: 1. Endometriosis. 2. Desires permanent sterilization.  OPERATION:  Laparoscopy with application of Filshie clips and ablation of endometriosis.  SURGEON:  Guy Sandifer. Arleta Creek, M.D.  ANESTHESIA:  General with endotracheal intubation, J. Linward Foster., M.D.  ESTIMATED BLOOD LOSS:  Drops.  INDICATIONS AND CONSENT:  The patient is a 35 year old married white female G3, P2, Ab 1, who has known endometriosis.  She has increasing symptoms of dysmenorrhea. She also desires permanent sterilization.  This has been discussed with the patient on multiple occasions.  Permanence of the procedure has been discussed and alternative methods of contraception have been discussed.  Potential risks and complications have been discussed including but not limited to infection, bowel, bladder or ureteral damage, bleeding requiring transfusion of blood products with possible HIV hepatitis acquisition and transfusion reaction, DVT, PE, pneumonia. Failure rate increased ectopic risk and permanence of tubal ligation has also been discussed.  Consent is signed and on the chart.  FINDINGS:  The upper abdomen is grossly normal.  The uterus is retroverted approximately six weeks in size.  Anterior cul-de-sac contains a single dark spot of endometriosis on the upper part of the left vesicouterine fold.  The posterior cul-de-sac contained several brown smudged areas consistent with endometriosis.  There are several small spots of endometriosis on the left pelvic side wall which is white patchy types.  Right pelvic side wall is  normal.  Tubes and ovaries are normal bilaterally.  DESCRIPTION OF PROCEDURE:  The patient was taken to the operating room and placed in dorsal supine position where general anesthesia via endotracheal intubation.  She was then placed in the dorsal lithotomy position where she was prepped abdominally and vaginally.  Bladder straight catheterized and she was draped in a sterile fashion.  Hulka tenaculum was placed in the uterus as a manipulator. Small infraumbilical incision was made and a 10 mm disposable trocar sleeve was placed without difficulty.  Placement was verified with the laparoscope and no damage o surrounding structures was noted.  The pneumoperitoneum was induced.  A small suprapubic incision was made and a 5 mm nondisposable trocar sleeve was placed under direct visualization without difficulty.  The above findings are noted. he course of the ureters was carefully identified bilaterally and seen to be clear of the areas of surgery.  Bipolar cautery was used to ablate the areas of endometriosis.  The right fallopian tube was then identified from cornua to fimbria.  A Filshie clip was placed on the proximal segment of the tube.  A similar procedure was carried out on the left fallopian tube.  Filshie clip applicator as removed and close inspection reveals the clip to be completely around the proximal portion of the tube with the heel of the clip being seen through the mesosalpinx bilaterally.  Suprapubic trocar sleeve was removed.  Pneumoperitoneum was reduced and no bleeding was noted. The pneumoperitoneum was completely reduced.  The umbilical trocar sleeve was removed.  Skin incisions were closed with  subcuticular 3-0 Vicryl suture.  Incisions are injected with 0.5% plain Marcaine.  All counts are correct.  The Hulka tenaculum was removed and no bleeding was noted.  The patient was awakened and taken to the recovery room in stable condition. DD:  12/10/98 TD:   12/12/99 Job: [id number]

## 2011-03-05 NOTE — Op Note (Signed)
NAME:  Andrea Hood, Andrea Hood                         ACCOUNT NO.:  [id number]   MEDICAL RECORD NO.:  [id number]                   PATIENT TYPE:  OBV   LOCATION:  9302                                 FACILITY:  [facility]   PHYSICIAN:  Guy Sandifer. Arleta Creek, M.D.           DATE OF BIRTH:  [birth year]   DATE OF PROCEDURE:  06/10/2004  DATE OF DISCHARGE:                                 OPERATIVE REPORT   PREOPERATIVE DIAGNOSIS:  Endometriosis.   POSTOPERATIVE DIAGNOSIS:  Endometriosis.   PROCEDURE:  1. Laparoscopically assisted vaginal hysterectomy.  2. Ablation of endometriosis.   SURGEON:  Guy Sandifer. Henderson Cloud, M.D.   ASSISTANT:  Olivia Mackie, M.D.   ANESTHESIA:  General with endotracheal intubation.   ESTIMATED BLOOD LOSS:  200 mL.   SPECIMENS TO PATHOLOGY:  Uterus.   INDICATIONS AND CONSENT:  This patient is a 35 year old married, white  female, G3, P2, status post tubal ligation with known endometriosis and  increasing pain.  Details are dictated in the History and Physical.  Laparoscopically assisted vaginal hysterectomy and removal of the tubes and  ovary if distinctly abnormal has been discussed.  Potential risks and  complications are discussed preoperatively including but no limited to  infection, bowel, bladder, ureteral damage, bleeding requiring transfusion  of blood products, with possible transfusion reaction, HIV and hepatitis  acquisition, DVT, PE, pneumonia, fistula formation, laparotomy,  postoperative dyspareunia, and recurrent endometriosis.  All questions have  been answered and consent is signed is signed on the chart.   FINDINGS:  Upper abdomen is grossly normal.  Appendix is normal.  Uterus is  retroverted upper limits of normal size.  Posterior cul-de-sac containing  multiple powder burn-type lesions of endometriosis.  Tubes are status post  ligation bilaterally with Filshie clip on the right tube and Filshie clip  not apparent on the left tube.  Ovaries are  normal.   PROCEDURE IN DETAIL:  The patient is taken to the operating room where she  is identified, placed in dorsal supine position and general anesthesia is  induced via endotracheal intubation.  She is then placed in dorsal lithotomy  position and prepped abdominally and vaginally.  Bladder is straight  catheterized.  Hulka tenaculum was placed in the uterus as a manipulator and  she was draped in a sterile fashion.  A small infraumbilical incision is  made after injection with 0.5% plain Marcaine.  A Veress needle was placed  with a normal syringe and drop test, 2 L of gas were insufflated under low  pressure with good tympany in the right upper quadrant.  Veress needle is  removed and is replaced with a 10-11 disposable trocar sleeve.  Placement is  verified with the laparoscope and no damage to the surrounding structures is  noted.  A small suprapubic incision is made after injection with 0.5% plain  Marcaine and a 5 mm disposable trocar sleeve is place under direct  visualization without  difficulty.  The above findings were noted.  The  implants of endometriosis of the posterior cul-de-sac are ablated with  bipolar cautery.  Then, using the Gyrus bipolar cautery cutting instrument,  the proximal ligaments were taken down bilaterally to the level of the  vesicouterine peritoneum.  Some bleeding from the superior branch of the  uterine artery on the left side is controlled by carefully visualizing the  vessel and cauterizing with the Gyrus bipolar instrument.  This obtains good  hemostasis.   Attention is then turned to the vagina.  Posterior cul-de-sac was entered  and the cervix is circumscribed with unipolar cautery.  The mucosa is  advanced sharply and bluntly.  Ligaments are taken down with the Gyrus  bipolar cautery instrument.  The uterosacral ligaments followed by the  bladder pillars and the cardinal ligaments are taken down bilaterally.  The  uterine arteries are taken  bilaterally.  The fundus is then delivered  posteriorly.  Proximal ligaments are taken down and the specimen is  delivered.  The uterosacral ligaments are plicated to the vaginal cuff  bilateral with 0 Monocryl.  They are then plicated in the midline with  separate suture.  All sutures will be 0 Monocryl unless otherwise  designated.  The cuff is then closed with figure-of-eights.  Good hemostasis  is noted.  A Foley catheter is placed in the bladder and clear urine is  noted.   Attention is returned to the abdomen.  Careful inspection, irrigation, and  re-inspection under reduced pneumoperitoneum reveals excellent hemostasis  all around.  Excess fluid is removed.  Suprapubic trocar sleeve is removed.  Pneumoperitoneum is reduced and the umbilical trocar sleeve is removed.  Both incisions are closed with surgical glue.  All counts are correct.  The  patient is awakened, taken to recovery room in stable condition.                                               Guy Sandifer Arleta Creek, M.D.    JET/MEDQ  D:  06/10/2004  T:  06/10/2004  Job:  [id number]

## 2011-03-05 NOTE — Discharge Summary (Signed)
NAME:  Andrea Hood, Andrea Hood                         ACCOUNT NO.:  [id number]   MEDICAL RECORD NO.:  [id number]                   PATIENT TYPE:  OBV   LOCATION:  9302                                 FACILITY:  [facility]   PHYSICIAN:  Guy Sandifer. Arleta Creek, M.D.           DATE OF BIRTH:  Jan 09, 1976   DATE OF ADMISSION:  06/10/2004  DATE OF DISCHARGE:                                 DISCHARGE SUMMARY   ADMITTING DIAGNOSIS:  Endometriosis.   DISCHARGE DIAGNOSIS:  Endometriosis.   PROCEDURE:  On June 10, 2004 laparoscopic-assisted vaginal hysterectomy  and ablation of endometriosis.   REASON FOR ADMISSION:  The patient is a 35 year old married white female G3  P2 status post tubal ligation with increasing dysmenorrhea and known  endometriosis.  She is admitted for surgical management.   HOSPITAL COURSE:  The patient is admitted to the hospital, undergoes the  above procedure.  On the evening of surgery she has good pain relief,  passing flatus.  Vital signs are stable, she is afebrile, with clear urine  output.  On the day of discharge, white count is 6.5, hemoglobin 8.9.  Vital  signs are stable.  She is tolerating a regular diet and ambulating well.  Pathology is pending.   CONDITION ON DISCHARGE:  Good.   DIET:  Regular as tolerated.   ACTIVITY:  No lifting, no operation of automobiles, no vaginal entry.  She  is to call the office for problems including but not limited to temperature  of 101 degrees, heavy vaginal bleeding, persistent nausea or vomiting, or  increasing pain.   MEDICATIONS:  1. Percocet 5/325 mg #30 one to two p.o. q.6h. p.r.n.  2. Ibuprofen 600 mg q.6h. p.r.n.  3. Multivitamin daily.  4. Colace p.r.n.   Follow-up is in the office in 2 weeks.                                               Guy Sandifer Arleta Creek, M.D.    JET/MEDQ  D:  06/11/2004  T:  06/11/2004  Job:  [id number]

## 2011-03-05 NOTE — H&P (Signed)
NAME:  Andrea Hood, Andrea Hood                         ACCOUNT NO.:  [id number]   MEDICAL RECORD NO.:  [id number]                   PATIENT TYPE:  OBV   LOCATION:  NA                                   FACILITY:  [facility]   PHYSICIAN:  Guy Sandifer. Arleta Creek, M.D.           DATE OF BIRTH:  07-31-1976   DATE OF ADMISSION:  06/10/2004  DATE OF DISCHARGE:                                HISTORY & PHYSICAL   CHIEF COMPLAINT:  Endometriosis.   HISTORY OF PRESENT ILLNESS:  This patient is a 35 year old white female, G3,  P2, status post tubal ligation with known endometriosis.  She has recurrent  premenstrual pain and dysmenorrhea.  It has been refractory to outpatient  management.  She also has increasingly heavy menses, changing a tampon every  1 to 1-1/2 hours during menses.  Options of management had been discussed,  and the patient is being admitted for laparoscopically-assisted vaginal  hysterectomy and removal of one ovary if abnormal.  Potential risks and  complications have been reviewed with the patient preoperatively.   PAST MEDICAL HISTORY:  1. Endometriosis.  2. History of abnormal Pap smear.   PAST SURGICAL HISTORY:  1. Laparoscopy in 1988.  2. Laparoscopy with tubal ligation 2001.   MEDICATIONS:  None.   ALLERGIES:  No known drug allergies.   SOCIAL HISTORY:  The patient denies tobacco, alcohol, or drug abuse.   FAMILY HISTORY:  Pregnancy-induced hypertension in the patient's mother.  Blood clots in mother.  Chronic hypertension in mother.  Diabetes in  maternal grandmother.  Abnormal thyroid function in mother.  Breast cancer  in maternal aunt.  Family history of cervical cancer.   OBSTETRIC HISTORY:  1. Cesarean section x 1.  2. Vaginal delivery x 1.   REVIEW OF SYSTEMS:  NEUROLOGIC:  Denies headache.  PULMONARY:  Denies cough,  shortness of breath.  GI:  Denies changes in bowel habits.  CARDIAC:  Denies  chest pain.   PHYSICAL EXAMINATION:  VITAL SIGNS:  Height 5 feet 5  inches, weight 149  pounds, blood pressure 120/68.  HEENT:  Without thyromegaly.  LUNGS:  Clear to auscultation.  HEART:  Regular rate and rhythm.  BACK:  Without CVA tenderness.  BREASTS:  Without mass, retraction, discharge.  ABDOMEN:  Soft, nontender, without masses.  PELVIC:  Vulva, vagina, cervix without lesion.  Uterus is retroverted,  mildly tender, mobile.  Right adnexa nontender without masses.  Left adnexa  mildly tender without masses.  EXTREMITIES/NEUROLOGIC:  Grossly within normal limits.   ASSESSMENT:  Endometriosis.   PLAN:  Laparoscopically-assisted vaginal hysterectomy and removal of an  ovary if abnormal.                                               Guy Sandifer. Arleta Creek, M.D.  JET/MEDQ  D:  06/02/2004  T:  06/02/2004  Job:  [id number]

## 2011-03-09 ENCOUNTER — Other Ambulatory Visit: Payer: Self-pay | Admitting: Family Medicine

## 2011-07-11 ENCOUNTER — Encounter: Payer: Self-pay | Admitting: *Deleted

## 2011-07-11 ENCOUNTER — Emergency Department (INDEPENDENT_AMBULATORY_CARE_PROVIDER_SITE_OTHER): Payer: Medicaid Other

## 2011-07-11 ENCOUNTER — Emergency Department (HOSPITAL_BASED_OUTPATIENT_CLINIC_OR_DEPARTMENT_OTHER)
Admission: EM | Admit: 2011-07-11 | Discharge: 2011-07-11 | Disposition: A | Discharge status: Home / Self Care | Payer: Self-pay | Attending: Emergency Medicine | Admitting: Emergency Medicine

## 2011-07-11 DIAGNOSIS — X58XXXA Exposure to other specified factors, initial encounter: Secondary | ICD-10-CM

## 2011-07-11 DIAGNOSIS — Z79899 Other long term (current) drug therapy: Secondary | ICD-10-CM | POA: Insufficient documentation

## 2011-07-11 DIAGNOSIS — M25579 Pain in unspecified ankle and joints of unspecified foot: Secondary | ICD-10-CM | POA: Insufficient documentation

## 2011-07-11 DIAGNOSIS — M79673 Pain in unspecified foot: Secondary | ICD-10-CM

## 2011-07-11 DIAGNOSIS — F17200 Nicotine dependence, unspecified, uncomplicated: Secondary | ICD-10-CM | POA: Insufficient documentation

## 2011-07-11 DIAGNOSIS — M79609 Pain in unspecified limb: Secondary | ICD-10-CM

## 2011-07-11 HISTORY — DX: Unspecified convulsions: R56.9

## 2011-07-11 MED ORDER — TRAMADOL HCL 50 MG PO TABS
50.0000 mg | ORAL_TABLET | Freq: Four times a day (QID) | ORAL | Status: AC | PRN
Start: 2011-07-11 — End: 2011-07-21

## 2011-07-11 NOTE — ED Provider Notes (Signed)
Medical screening examination/treatment/procedure(s) were performed by non-physician practitioner and as supervising physician I was immediately available for consultation/collaboration.   Rolan Bucco, MD 07/11/11 [phone]

## 2011-07-11 NOTE — ED Notes (Signed)
Pt states she fell down some steps last Friday and injured her left foot. C/O pain to same. Splinted with daughter's splint.

## 2011-07-11 NOTE — ED Provider Notes (Signed)
History     CSN: [id number] Arrival date & time: 07/11/2011  4:15 PM  Chief Complaint  Patient presents with  . Foot Injury    HPI  (Consider location/radiation/quality/duration/timing/severity/associated sxs/prior treatment)  HPI Comments: Pt states that she is continuing to have pain:pt states that she splinted herself with her daughters boot  Patient is a 35 y.o. female presenting with foot injury. The history is provided by the patient. No language interpreter was used.  Foot Injury  The incident occurred more than 1 week ago. The incident occurred at home. The injury mechanism was a fall. The pain is present in the left foot. The pain is moderate. The pain has been constant since onset. Associated symptoms include inability to bear weight. She reports no foreign bodies present. The symptoms are aggravated by bearing weight.    Past Medical History  Diagnosis Date  . Seizures     Past Surgical History  Procedure Date  . Abdominal hysterectomy     History reviewed. No pertinent family history.  History  Substance Use Topics  . Smoking status: Current Everyday Smoker  . Smokeless tobacco: Not on file  . Alcohol Use: No    OB History    Grav Para Term Preterm Abortions TAB SAB Ect Mult Living                  Review of Systems  Review of Systems  All other systems reviewed and are negative.    Allergies  Review of patient's allergies indicates no known allergies.  Home Medications   Current Outpatient Rx  Name Route Sig Dispense Refill  . ALBUTEROL SULFATE HFA 108 (90 BASE) MCG/ACT IN AERS Inhalation Inhale 2 puffs into the lungs every 6 (six) hours as needed. Shortness of breath and wheezing     . VITAMIN D 1000 UNITS PO TABS Oral Take 1,000 Units by mouth daily.      Marland Kitchen CLONAZEPAM 1 MG PO TABS  TAKE 1 TABLET BY MOUTH TWICE DAILY 60 tablet 0  . GABAPENTIN 300 MG PO CAPS Oral Take 300 mg by mouth at bedtime.      Marland Kitchen TIOTROPIUM BROMIDE MONOHYDRATE 18 MCG IN  CAPS Inhalation Place 18 mcg into inhaler and inhale daily.      Marland Kitchen VITAMIN B-12 1000 MCG PO TABS Oral Take 1,000 mcg by mouth daily.      Marland Kitchen HYDROCODONE-ACETAMINOPHEN 5-500 MG PO TABS  TAKE 1 TABLET BY MOUTH EVERY 6 HOURS AS NEEDED FOR PAIN 45 tablet 0  . TRAMADOL HCL 50 MG PO TABS  TAKE 1 TABLET BY MOUTH FOUR TIMES DAILY AS NEEDED FOR PAIN 50 tablet 0    Physical Exam    BP 118/69  Pulse 100  Temp(Src) 98.9 F (37.2 C) (Oral)  Resp 20  Ht 5\' 4"  (1.626 m)  Wt 128 lb (58.06 kg)  BMI 21.97 kg/m2  SpO2 99%  Physical Exam  Nursing note and vitals reviewed. Constitutional: She is oriented to person, place, and time. She appears well-developed and well-nourished.  HENT:  Head: Normocephalic and atraumatic.  Cardiovascular: Normal rate and regular rhythm.   Pulmonary/Chest: Effort normal and breath sounds normal.  Musculoskeletal:       Pt tender along the left lateral foot, without obvious deformity to the area  Neurological: She is alert and oriented to person, place, and time.  Skin: Skin is warm and dry.  Psychiatric: She has a normal mood and affect.    ED Course  Procedures (  including critical care time)   Dg Foot Complete Left  07/11/2011  *RADIOLOGY REPORT*  Clinical Data: Larey Seat 1 week ago and injured left foot, persistent pain localizing to the fourth and fifth metatarsals.  LEFT FOOT - COMPLETE 3+ VIEW 07/11/2011:  Comparison: None.  Findings: No evidence of acute or subacute fracture or dislocation. Well-preserved joint spaces.  Well-preserved bone mineral density. No intrinsic osseous abnormalities.  IMPRESSION: Normal examination.  Original Report Authenticated By: Arnell Sieving, M.D.      MDM No acute abnormality noted on x-ray:pt okay to continue to have the foot wrapped for comfort:pt already has pain medication at home        Teressa Lower, NP 07/11/11 1700

## 2011-07-13 LAB — URINALYSIS, ROUTINE W REFLEX MICROSCOPIC
Glucose, UA: NEGATIVE
Ketones, ur: NEGATIVE
Protein, ur: NEGATIVE

## 2011-07-13 LAB — BASIC METABOLIC PANEL
BUN: 10
CO2: 27
Glucose, Bld: 94
Potassium: 4
Sodium: 139

## 2011-07-13 LAB — DIFFERENTIAL
Basophils Absolute: 0
Basophils Relative: 0
Eosinophils Absolute: 0.1
Eosinophils Relative: 2
Lymphocytes Relative: 41

## 2011-07-13 LAB — CBC
HCT: 35.2 — ABNORMAL LOW
MCV: 86.3
Platelets: 193
RDW: 13.2

## 2011-07-15 LAB — CBC
HCT: 38.9
Hemoglobin: 13.2
MCHC: 34
MCV: 86.6
RBC: 4.49
WBC: 6.1

## 2011-07-15 LAB — URINALYSIS, ROUTINE W REFLEX MICROSCOPIC
Bilirubin Urine: NEGATIVE
Hgb urine dipstick: NEGATIVE
Ketones, ur: NEGATIVE
Specific Gravity, Urine: 1.012
pH: 5.5

## 2011-07-15 LAB — DIFFERENTIAL
Basophils Absolute: 0
Eosinophils Relative: 1
Lymphocytes Relative: 37
Neutrophils Relative %: 52

## 2011-07-15 LAB — COMPREHENSIVE METABOLIC PANEL
AST: 19
BUN: 9
CO2: 23
Calcium: 9.5
Chloride: 104
Creatinine, Ser: 0.6
GFR calc Af Amer: >60
GFR calc non Af Amer: >60
Glucose, Bld: 93
Total Bilirubin: 0.6

## 2011-07-29 LAB — DIFFERENTIAL
Basophils Absolute: 0
Basophils Relative: 0
Eosinophils Relative: 1
Monocytes Absolute: 0.6

## 2011-07-29 LAB — CBC
HCT: 36.8
Platelets: 199
RBC: 4.26
WBC: 8.6

## 2011-07-29 LAB — COMPREHENSIVE METABOLIC PANEL
AST: 17
Albumin: 4.1
Alkaline Phosphatase: 41
BUN: 16
CO2: 26
Chloride: 101
GFR calc Af Amer: >60
Potassium: 4.2
Total Bilirubin: 0.5

## 2011-07-29 LAB — URINALYSIS, ROUTINE W REFLEX MICROSCOPIC
Bilirubin Urine: NEGATIVE
Ketones, ur: NEGATIVE
Nitrite: NEGATIVE
Protein, ur: NEGATIVE
Urobilinogen, UA: 0.2

## 2012-06-20 ENCOUNTER — Emergency Department: Payer: Self-pay | Admitting: Emergency Medicine

## 2012-06-20 LAB — CBC
HCT: 38.2 % (ref 35.0–47.0)
HGB: 12.9 g/dL (ref 12.0–16.0)
MCH: 28.7 pg (ref 26.0–34.0)
MCHC: 33.9 g/dL (ref 32.0–36.0)
MCV: 85 fL (ref 80–100)
RBC: 4.5 10*6/uL (ref 3.80–5.20)

## 2012-06-20 LAB — DRUG SCREEN, URINE
Benzodiazepine, Ur Scrn: NEGATIVE (ref ?–200)
MDMA (Ecstasy)Ur Screen: NEGATIVE (ref ?–500)
Methadone, Ur Screen: NEGATIVE (ref ?–300)

## 2012-06-20 LAB — COMPREHENSIVE METABOLIC PANEL
Albumin: 4.3 g/dL (ref 3.4–5.0)
Alkaline Phosphatase: 77 U/L (ref 50–136)
Anion Gap: 6 — ABNORMAL LOW (ref 7–16)
BUN: 8 mg/dL (ref 7–18)
EGFR (Non-African Amer.): >60
Glucose: 104 mg/dL — ABNORMAL HIGH (ref 65–99)
Potassium: 3.4 mmol/L — ABNORMAL LOW (ref 3.5–5.1)
SGOT(AST): 20 U/L (ref 15–37)
Sodium: 139 mmol/L (ref 136–145)
Total Protein: 8.2 g/dL (ref 6.4–8.2)

## 2012-06-20 LAB — TSH: Thyroid Stimulating Horm: 1.95 u[IU]/mL

## 2012-06-20 LAB — SALICYLATE LEVEL: Salicylates, Serum: 1.8 mg/dL

## 2012-07-10 ENCOUNTER — Emergency Department: Payer: Self-pay | Admitting: *Deleted

## 2012-07-10 LAB — CBC WITH DIFFERENTIAL/PLATELET
Basophil #: 0 x10 3/mm 3
Basophil %: 0.5 %
Eosinophil #: 0.2 x10 3/mm 3
Eosinophil %: 2.1 %
HCT: 35.6 %
HGB: 12 g/dL
Lymphocyte %: 32.2 %
Lymphs Abs: 2.5 x10 3/mm 3
MCH: 29 pg
MCHC: 33.8 g/dL
MCV: 86 fL
Monocyte #: 0.7 "x10 3/mm "
Monocyte %: 8.7 %
Neutrophil #: 4.4 x10 3/mm 3
Neutrophil %: 56.5 %
Platelet: 207 x10 3/mm 3
RBC: 4.15 X10 6/mm 3
RDW: 14.4 %
WBC: 7.7 x10 3/mm 3

## 2012-07-10 LAB — DRUG SCREEN, URINE
Amphetamines, Ur Screen: NEGATIVE (ref ?–1000)
Benzodiazepine, Ur Scrn: POSITIVE (ref ?–200)
Cocaine Metabolite,Ur ~~LOC~~: NEGATIVE (ref ?–300)
Methadone, Ur Screen: NEGATIVE (ref ?–300)
Opiate, Ur Screen: NEGATIVE (ref ?–300)
Tricyclic, Ur Screen: NEGATIVE (ref ?–1000)

## 2012-07-10 LAB — COMPREHENSIVE METABOLIC PANEL WITH GFR
Albumin: 4.3 g/dL
Alkaline Phosphatase: 82 U/L
Anion Gap: 8
BUN: 17 mg/dL
Bilirubin,Total: 0.3 mg/dL
Calcium, Total: 9.6 mg/dL
Chloride: 105 mmol/L
Co2: 27 mmol/L
Creatinine: 0.62 mg/dL
EGFR (African American): >60
EGFR (Non-African Amer.): >60
Glucose: 78 mg/dL
Osmolality: 280
Potassium: 3.4 mmol/L — ABNORMAL LOW
SGOT(AST): 23 U/L
SGPT (ALT): 27 U/L
Sodium: 140 mmol/L
Total Protein: 8 g/dL

## 2012-07-10 LAB — URINALYSIS, COMPLETE
Bacteria: NONE SEEN
Blood: NEGATIVE
Nitrite: NEGATIVE
Protein: NEGATIVE
Specific Gravity: 1.029 (ref 1.003–1.030)
WBC UR: 4 /HPF (ref 0–5)

## 2012-07-10 LAB — ETHANOL
Ethanol %: <0.003 %
Ethanol: <3 mg/dL

## 2012-07-10 LAB — LIPASE, BLOOD: Lipase: 108 U/L (ref 73–393)

## 2012-10-10 LAB — URINALYSIS, COMPLETE
Glucose,UR: NEGATIVE mg/dL (ref 0–75)
Ketone: NEGATIVE
Nitrite: NEGATIVE
RBC,UR: 1 /HPF (ref 0–5)
Specific Gravity: 1.01 (ref 1.003–1.030)
WBC UR: 2 /HPF (ref 0–5)

## 2012-10-10 LAB — COMPREHENSIVE METABOLIC PANEL
Albumin: 3.6 g/dL (ref 3.4–5.0)
Alkaline Phosphatase: 90 U/L (ref 50–136)
Calcium, Total: 8.8 mg/dL (ref 8.5–10.1)
Co2: 23 mmol/L (ref 21–32)
EGFR (Non-African Amer.): >60
Glucose: 112 mg/dL — ABNORMAL HIGH (ref 65–99)
Osmolality: 285 (ref 275–301)
SGOT(AST): 22 U/L (ref 15–37)
SGPT (ALT): 23 U/L (ref 12–78)
Sodium: 143 mmol/L (ref 136–145)

## 2012-10-10 LAB — DRUG SCREEN, URINE
Barbiturates, Ur Screen: NEGATIVE (ref ?–200)
Benzodiazepine, Ur Scrn: POSITIVE (ref ?–200)
Cannabinoid 50 Ng, Ur ~~LOC~~: NEGATIVE (ref ?–50)
Cocaine Metabolite,Ur ~~LOC~~: NEGATIVE (ref ?–300)
Methadone, Ur Screen: NEGATIVE (ref ?–300)

## 2012-10-10 LAB — ACETAMINOPHEN LEVEL: Acetaminophen: <2 ug/mL

## 2012-10-10 LAB — TSH: Thyroid Stimulating Horm: 2.08 u[IU]/mL

## 2012-10-10 LAB — CBC
MCH: 29.4 pg (ref 26.0–34.0)
MCHC: 34 g/dL (ref 32.0–36.0)
MCV: 86 fL (ref 80–100)
Platelet: 194 10*3/uL (ref 150–440)
RDW: 14.2 % (ref 11.5–14.5)

## 2012-10-11 ENCOUNTER — Inpatient Hospital Stay: Payer: Self-pay | Admitting: Psychiatry

## 2012-10-11 LAB — URINALYSIS, COMPLETE
Bilirubin,UR: NEGATIVE
Blood: NEGATIVE
Ketone: NEGATIVE
Nitrite: NEGATIVE
Ph: 5 (ref 4.5–8.0)
Squamous Epithelial: 3

## 2012-10-12 LAB — BEHAVIORAL MEDICINE 1 PANEL
Albumin: 3.1 g/dL — ABNORMAL LOW (ref 3.4–5.0)
Alkaline Phosphatase: 84 U/L (ref 50–136)
Anion Gap: 6 — ABNORMAL LOW (ref 7–16)
Basophil %: 0.9 %
Bilirubin,Total: 0.2 mg/dL (ref 0.2–1.0)
Calcium, Total: 8.6 mg/dL (ref 8.5–10.1)
Co2: 28 mmol/L (ref 21–32)
Creatinine: 0.57 mg/dL — ABNORMAL LOW (ref 0.60–1.30)
EGFR (Non-African Amer.): >60
Eosinophil #: 0.4 10*3/uL (ref 0.0–0.7)
Eosinophil %: 4.5 %
HCT: 32.2 % — ABNORMAL LOW (ref 35.0–47.0)
HGB: 10.4 g/dL — ABNORMAL LOW (ref 12.0–16.0)
Lymphocyte #: 3 10*3/uL (ref 1.0–3.6)
Lymphocyte %: 38.6 %
MCH: 27.8 pg (ref 26.0–34.0)
MCHC: 32.4 g/dL (ref 32.0–36.0)
MCV: 86 fL (ref 80–100)
Neutrophil #: 3.6 10*3/uL (ref 1.4–6.5)
Neutrophil %: 46 %
Osmolality: 285 (ref 275–301)
SGOT(AST): 17 U/L (ref 15–37)
Sodium: 144 mmol/L (ref 136–145)
Thyroid Stimulating Horm: 2.34 u[IU]/mL

## 2013-11-01 ENCOUNTER — Encounter (HOSPITAL_COMMUNITY): Payer: Self-pay | Admitting: Emergency Medicine

## 2013-11-01 ENCOUNTER — Emergency Department (INDEPENDENT_AMBULATORY_CARE_PROVIDER_SITE_OTHER): Payer: Self-pay

## 2013-11-01 ENCOUNTER — Emergency Department (HOSPITAL_COMMUNITY)
Admission: EM | Admit: 2013-11-01 | Discharge: 2013-11-01 | Disposition: A | Discharge status: Home / Self Care | Payer: Self-pay | Source: Home / Self Care | Attending: Family Medicine | Admitting: Family Medicine

## 2013-11-01 DIAGNOSIS — M79609 Pain in unspecified limb: Secondary | ICD-10-CM

## 2013-11-01 DIAGNOSIS — M79673 Pain in unspecified foot: Secondary | ICD-10-CM

## 2013-11-01 MED ORDER — HYDROCODONE-ACETAMINOPHEN 5-325 MG PO TABS: 1.0000 | ORAL_TABLET | Freq: Four times a day (QID) | ORAL | Status: DC | PRN

## 2013-11-01 NOTE — ED Notes (Signed)
Pt c/o left foot inj onset today around 0900 Reports a door was swung open and caught the top of her foot Pain increases w/activity... She was able to work afterwards She is alert w/no signs of acute distress.

## 2013-11-01 NOTE — ED Provider Notes (Signed)
CSN: [id number]     Arrival date & time 11/01/13  1907 History   First MD Initiated Contact with Patient 11/01/13 1955     Chief Complaint  Patient presents with  . Foot Injury   (Consider location/radiation/quality/duration/timing/severity/associated sxs/prior Treatment) HPI Comments: 38f with left foot injury.  At 9:00 this morning a heavy door swung open and hit into the top of her left foot. She had immediate pain, followed soon thereafter by swelling and bruising. The pain is increased with any ambulation. She wants to make sure she has not broken anything. No other injuries.  Patient is a 38 y.o. female presenting with foot injury.  Foot Injury Associated symptoms: no fever     Past Medical History  Diagnosis Date  . Seizures    Past Surgical History  Procedure Laterality Date  . Abdominal hysterectomy     No family history on file. History  Substance Use Topics  . Smoking status: Current Every Day Smoker  . Smokeless tobacco: Not on file  . Alcohol Use: No   OB History   Grav Para Term Preterm Abortions TAB SAB Ect Mult Living                 Review of Systems  Constitutional: Negative for fever and chills.  Eyes: Negative for visual disturbance.  Respiratory: Negative for cough and shortness of breath.   Cardiovascular: Negative for chest pain, palpitations and leg swelling.  Gastrointestinal: Negative for nausea, vomiting and abdominal pain.  Endocrine: Negative for polydipsia and polyuria.  Genitourinary: Negative for dysuria, urgency and frequency.  Musculoskeletal: Positive for gait problem and myalgias. Negative for arthralgias.  Skin: Negative for rash.  Neurological: Negative for dizziness, weakness and light-headedness.    Allergies  Review of patient's allergies indicates no known allergies.  Home Medications   Current Outpatient Rx  Name  Route  Sig  Dispense  Refill  . clonazePAM (KLONOPIN) 1 MG tablet      TAKE 1 TABLET BY MOUTH TWICE  DAILY   60 tablet   0   . LevETIRAcetam (KEPPRA PO)   Oral   Take by mouth.         Marland Kitchen albuterol (PROVENTIL HFA;VENTOLIN HFA) 108 (90 BASE) MCG/ACT inhaler   Inhalation   Inhale 2 puffs into the lungs every 6 (six) hours as needed. Shortness of breath and wheezing          . cholecalciferol (VITAMIN D) 1000 UNITS tablet   Oral   Take 1,000 Units by mouth daily.           Marland Kitchen gabapentin (NEURONTIN) 300 MG capsule   Oral   Take 300 mg by mouth at bedtime.           Marland Kitchen HYDROcodone-acetaminophen (NORCO) 5-325 MG per tablet   Oral   Take 1 tablet by mouth every 6 (six) hours as needed for moderate pain.   20 tablet   0   . HYDROcodone-acetaminophen (VICODIN) 5-500 MG per tablet      TAKE 1 TABLET BY MOUTH EVERY 6 HOURS AS NEEDED FOR PAIN   45 tablet   0   . tiotropium (SPIRIVA) 18 MCG inhalation capsule   Inhalation   Place 18 mcg into inhaler and inhale daily.           . traMADol (ULTRAM) 50 MG tablet      TAKE 1 TABLET BY MOUTH FOUR TIMES DAILY AS NEEDED FOR PAIN   50 tablet  0   . vitamin B-12 (CYANOCOBALAMIN) 1000 MCG tablet   Oral   Take 1,000 mcg by mouth daily.            BP 111/75  Pulse 84  Temp(Src) 97.8 F (36.6 C) (Oral)  Resp 20  SpO2 100% Physical Exam  Nursing note and vitals reviewed. Constitutional: She is oriented to person, place, and time. Vital signs are normal. She appears well-developed and well-nourished. No distress.  HENT:  Head: Normocephalic and atraumatic.  Pulmonary/Chest: Effort normal. No respiratory distress.  Musculoskeletal:       Feet:  Neurological: She is alert and oriented to person, place, and time. She has normal strength. Coordination normal.  Skin: Skin is warm and dry. No rash noted. She is not diaphoretic.  Psychiatric: She has a normal mood and affect. Judgment normal.    ED Course  Procedures (including critical care time) Labs Review Labs Reviewed - No data to display Imaging Review Dg Foot  Complete Left  11/01/2013   CLINICAL DATA:  Injured left foot when opening a door, striking the dorsum of the foot. Increased pain while walking.  EXAM: LEFT FOOT - COMPLETE 3+ VIEW  COMPARISON:  07/11/2011.  FINDINGS: Mild dorsal soft tissue swelling overlying the metatarsals. No evidence of acute fracture or dislocation. Joint spaces well preserved. Well-preserved bone mineral density. No intrinsic osseous abnormalities.  IMPRESSION: No osseous abnormality.   Electronically Signed   By: Hulan Saashomas  Lawrence M.D.   On: 11/01/2013 19:59      MDM   1. Foot pain    Post-op shoe, crutches.  Probable soft tissue injury.  F/u in 1 wk if still pain and will re-XR   Meds ordered this encounter  Medications  . HYDROcodone-acetaminophen (NORCO) 5-325 MG per tablet    Sig: Take 1 tablet by mouth every 6 (six) hours as needed for moderate pain.    Dispense:  20 tablet    Refill:  0    Order Specific Question:  Supervising Provider    Answer:  Bradd CanaryKINDL, JAMES D [[id number]]       Graylon GoodZachary H Matheus Spiker, PA-C 11/02/13 1255

## 2013-11-01 NOTE — Discharge Instructions (Signed)
Musculoskeletal Pain °Musculoskeletal pain is muscle and boney aches and pains. These pains can occur in any part of the body. Your caregiver may treat you without knowing the cause of the pain. They may treat you if blood or urine tests, X-rays, and other tests were normal.  °CAUSES °There is often not a definite cause or reason for these pains. These pains may be caused by a type of germ (virus). The discomfort may also come from overuse. Overuse includes working out too hard when your body is not fit. Boney aches also come from weather changes. Bone is sensitive to atmospheric pressure changes. °HOME CARE INSTRUCTIONS  °· Ask when your test results will be ready. Make sure you get your test results. °· Only take over-the-counter or prescription medicines for pain, discomfort, or fever as directed by your caregiver. If you were given medications for your condition, do not drive, operate machinery or power tools, or sign legal documents for 24 hours. Do not drink alcohol. Do not take sleeping pills or other medications that may interfere with treatment. °· Continue all activities unless the activities cause more pain. When the pain lessens, slowly resume normal activities. Gradually increase the intensity and duration of the activities or exercise. °· During periods of severe pain, bed rest may be helpful. Lay or sit in any position that is comfortable. °· Putting ice on the injured area. °· Put ice in a bag. °· Place a towel between your skin and the bag. °· Leave the ice on for 15 to 20 minutes, 3 to 4 times a day. °· Follow up with your caregiver for continued problems and no reason can be found for the pain. If the pain becomes worse or does not go away, it may be necessary to repeat tests or do additional testing. Your caregiver may need to look further for a possible cause. °SEEK IMMEDIATE MEDICAL CARE IF: °· You have pain that is getting worse and is not relieved by medications. °· You develop chest pain  that is associated with shortness or breath, sweating, feeling sick to your stomach (nauseous), or throw up (vomit). °· Your pain becomes localized to the abdomen. °· You develop any new symptoms that seem different or that concern you. °MAKE SURE YOU:  °· Understand these instructions. °· Will watch your condition. °· Will get help right away if you are not doing well or get worse. °Document Released: 10/04/2005 Document Revised: 12/27/2011 Document Reviewed: 06/08/2013 °[employer]® Patient Information ©2014 [employer]. ° °

## 2013-11-06 NOTE — ED Provider Notes (Signed)
Medical screening examination/treatment/procedure(s) were performed by resident physician or non-physician practitioner and as supervising physician I was immediately available for consultation/collaboration.   KINDL,JAMES DOUGLAS MD.   James D Kindl, MD 11/06/13 1359 

## 2014-03-06 ENCOUNTER — Emergency Department: Payer: Self-pay | Admitting: Emergency Medicine

## 2014-03-06 LAB — URINALYSIS, COMPLETE
BILIRUBIN, UR: NEGATIVE
Bacteria: NONE SEEN
Blood: NEGATIVE
GLUCOSE, UR: NEGATIVE mg/dL (ref 0–75)
Ketone: NEGATIVE
Leukocyte Esterase: NEGATIVE
NITRITE: NEGATIVE
PH: 7 (ref 4.5–8.0)
Protein: NEGATIVE
Specific Gravity: 1.018 (ref 1.003–1.030)
Squamous Epithelial: 2
WBC UR: <1 /HPF (ref 0–5)

## 2014-03-06 LAB — CBC WITH DIFFERENTIAL/PLATELET
Basophil #: 0 10*3/uL (ref 0.0–0.1)
Basophil %: 0.5 %
EOS ABS: 0.1 10*3/uL (ref 0.0–0.7)
Eosinophil %: 1.4 %
HCT: 38.7 % (ref 35.0–47.0)
HGB: 12.7 g/dL (ref 12.0–16.0)
Lymphocyte #: 2.2 10*3/uL (ref 1.0–3.6)
Lymphocyte %: 34.4 %
MCH: 28.9 pg (ref 26.0–34.0)
MCHC: 32.8 g/dL (ref 32.0–36.0)
MCV: 88 fL (ref 80–100)
MONO ABS: 0.5 x10 3/mm (ref 0.2–0.9)
Monocyte %: 8.1 %
NEUTROS ABS: 3.5 10*3/uL (ref 1.4–6.5)
Neutrophil %: 55.6 %
PLATELETS: 199 10*3/uL (ref 150–440)
RBC: 4.39 10*6/uL (ref 3.80–5.20)
RDW: 13.6 % (ref 11.5–14.5)
WBC: 6.4 10*3/uL (ref 3.6–11.0)

## 2014-03-06 LAB — COMPREHENSIVE METABOLIC PANEL
Albumin: 4 g/dL (ref 3.4–5.0)
Alkaline Phosphatase: 62 U/L
Anion Gap: 8 (ref 7–16)
BUN: 21 mg/dL — ABNORMAL HIGH (ref 7–18)
Bilirubin,Total: 0.4 mg/dL (ref 0.2–1.0)
CO2: 27 mmol/L (ref 21–32)
Calcium, Total: 9.7 mg/dL (ref 8.5–10.1)
Chloride: 107 mmol/L (ref 98–107)
Creatinine: 0.48 mg/dL — ABNORMAL LOW (ref 0.60–1.30)
EGFR (African American): >60
Glucose: 97 mg/dL (ref 65–99)
Osmolality: 286 (ref 275–301)
POTASSIUM: 3.8 mmol/L (ref 3.5–5.1)
SGOT(AST): 12 U/L — ABNORMAL LOW (ref 15–37)
SGPT (ALT): 20 U/L (ref 12–78)
SODIUM: 142 mmol/L (ref 136–145)
TOTAL PROTEIN: 7.7 g/dL (ref 6.4–8.2)

## 2014-05-15 ENCOUNTER — Emergency Department: Payer: Self-pay | Admitting: Emergency Medicine

## 2014-05-15 LAB — URINALYSIS, COMPLETE
Bacteria: NONE SEEN
Bilirubin,UR: NEGATIVE
Blood: NEGATIVE
Glucose,UR: NEGATIVE mg/dL (ref 0–75)
LEUKOCYTE ESTERASE: NEGATIVE
Nitrite: NEGATIVE
PROTEIN: NEGATIVE
Ph: 5 (ref 4.5–8.0)
RBC,UR: 2 /HPF (ref 0–5)
Specific Gravity: 1.018 (ref 1.003–1.030)
Squamous Epithelial: 1

## 2014-05-15 LAB — COMPREHENSIVE METABOLIC PANEL
ALBUMIN: 3.6 g/dL (ref 3.4–5.0)
ANION GAP: 8 (ref 7–16)
AST: 56 U/L — AB (ref 15–37)
Alkaline Phosphatase: 51 U/L
BUN: 11 mg/dL (ref 7–18)
Bilirubin,Total: 0.1 mg/dL — ABNORMAL LOW (ref 0.2–1.0)
Calcium, Total: 8.4 mg/dL — ABNORMAL LOW (ref 8.5–10.1)
Chloride: 108 mmol/L — ABNORMAL HIGH (ref 98–107)
Co2: 23 mmol/L (ref 21–32)
Creatinine: 0.79 mg/dL (ref 0.60–1.30)
EGFR (Non-African Amer.): >60
Glucose: 88 mg/dL (ref 65–99)
Osmolality: 276 (ref 275–301)
POTASSIUM: 3.5 mmol/L (ref 3.5–5.1)
SGPT (ALT): 42 U/L
Sodium: 139 mmol/L (ref 136–145)
Total Protein: 7.5 g/dL (ref 6.4–8.2)

## 2014-05-15 LAB — CBC WITH DIFFERENTIAL/PLATELET
Basophil #: 0 10*3/uL (ref 0.0–0.1)
Basophil %: 0.1 %
Eosinophil #: 0.1 10*3/uL (ref 0.0–0.7)
Eosinophil %: 2.4 %
HCT: 36.2 % (ref 35.0–47.0)
HGB: 12.1 g/dL (ref 12.0–16.0)
Lymphocyte #: 0.3 10*3/uL — ABNORMAL LOW (ref 1.0–3.6)
Lymphocyte %: 11.8 %
MCH: 29.3 pg (ref 26.0–34.0)
MCHC: 33.4 g/dL (ref 32.0–36.0)
MCV: 88 fL (ref 80–100)
Monocyte #: 0.2 x10 3/mm (ref 0.2–0.9)
Monocyte %: 6.9 %
NEUTROS ABS: 1.7 10*3/uL (ref 1.4–6.5)
Neutrophil %: 78.8 %
PLATELETS: 110 10*3/uL — AB (ref 150–440)
RBC: 4.13 10*6/uL (ref 3.80–5.20)
RDW: 13.1 % (ref 11.5–14.5)
WBC: 2.2 10*3/uL — ABNORMAL LOW (ref 3.6–11.0)

## 2014-05-17 ENCOUNTER — Emergency Department: Payer: Self-pay | Admitting: Emergency Medicine

## 2014-05-20 LAB — CULTURE, BLOOD (SINGLE)

## 2014-05-25 LAB — BETA STREP CULTURE(ARMC)

## 2014-12-11 ENCOUNTER — Ambulatory Visit: Payer: Self-pay | Admitting: Neurology

## 2014-12-11 ENCOUNTER — Inpatient Hospital Stay: Payer: Self-pay | Admitting: Internal Medicine
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
## 2015-02-07 NOTE — Discharge Summary (Signed)
PATIENT NAME:  Andrea Hood, Andrea Hood MR#:  [id number] DATE OF BIRTH:  14-Dec-1975  DATE OF ADMISSION:  10/11/2012 DATE OF DISCHARGE:  10/15/2012  HOSPITAL COURSE: See dictated history and physical for details of admission. This 39 year old woman was admitted after having taken an impulsive overdose of clonazepam while having an argument with her mother. In the hospital, the patient denied any suicidal ideation and did not display any suicidal behavior. She participated appropriately in groups on the unit. She showed good insight. The patient was continued on medication that she had been taking previously, as well as being started on venlafaxine 75 mg a day for depression. At the time of discharge, she totally denied any suicidal ideation. Affect was calm and appropriate. Thoughts were clear. She had a safe place to live and was agreeable to followup appointments at [facility].   DISCHARGE MEDICATIONS: Venlafaxine XR 75 mg p.o. daily, Septra DS 1 tablet q.12 hours for 5 more days, gabapentin 600 mg 3 times a day, Klonopin 1 mg 3 times a day, Keppra 1000 mg twice a day.   LABORATORY RESULTS: Admission labs showed drug screen positive for benzodiazepines. TSH negative at 2.08. Alcohol level of 79. Chemistry panel showed a creatinine slightly low at 0.57, glucose elevated at 112, chloride elevated at 110, bilirubin low at 0.1. CBC was normal. Urinalysis had white blood cells and was suggestive of a urinary tract infection. Acetaminophen and salicylates negative. EKG: Normal sinus rhythm, cannot rule out inferior infarct. Followup chemistry panel showed a low protein at 6.2, low albumin at 3.1, continued elevated chloride at 110 and low creatinine at 0.54.   DISPOSITION: Discharge home. Follow up with [facility].   MENTAL STATUS EXAMINATION: Neatly dressed and groomed woman who looks her stated age. Cooperative with the interview. Good eye contact. Normal psychomotor activity. Speech normal in rate, tone and volume. Affect  euthymic, reactive and appropriate. Mood stated as good. Thoughts are lucid with no loosening of associations or delusions. Denies auditory or visual hallucinations. Denies suicidal or homicidal ideation. Shows good judgment and insight. Intelligence normal. Alert and oriented x 4.   DIAGNOSES PRINCIPAL AND PRIMARY:  AXIS I:  1.  Depression, not otherwise specified.  2.  Alcohol abuse.  AXIS II: Deferred.  AXIS III: Urinary tract infection.  AXIS IV: Moderate to severe from chronic social stress and lack of resources.  AXIS V: Functioning at time of discharge: 55.   ____________________________ Audery AmelJohn T. Albertha Beattie, MD jtc:jm Hood: 10/31/2012 17:58:38 ET T: 10/31/2012 18:10:01 ET JOB#: [id number]  cc: Audery AmelJohn T. Teodoro Jeffreys, MD, <Dictator> Audery AmelJOHN T Heavenly Christine MD ELECTRONICALLY SIGNED 10/31/2012 22:35

## 2015-02-16 NOTE — H&P (Signed)
PATIENT NAME:  Andrea Hood, Andrea Hood MR#:  [id number] DATE OF BIRTH:  [birth year]  DATE OF ADMISSION:  12/11/2014  PRIMARY CARE PHYSICIAN: Nonlocal.  REFERRING PHYSICIAN: Eryka A. Inocencio HomesGayle, MD  CHIEF COMPLAINT: Right-sided headache and left-sided weakness for 3 days.   HISTORY OF PRESENT ILLNESS: A 39 year old Caucasian female with a history of migraine headache, seizure disorder, and multiple sclerosis who presented to the ED with the above chief complaint. The patient is alert, awake, oriented, in no acute distress. The patient complains of a right-sided headache for the past 3 days. In addition, the patient has left-sided weakness for 3 days. She complains of left foot numbness but denies any dysphasia or incontinence. The patient said that she only has mild slurred speech and blurry vision. The patient was diagnosed with multiple sclerosis 4 months ago at [facility]. She is not taking any medication for multiple sclerosis. She denies any other symptoms. Dr. Inocencio HomesGayle discussed with a neurologist, Dr. Loretha BrasilZeylikman, who suggested giving Decadron and magnesium IV, 1 dose. He will see the patient this afternoon.   PAST MEDICAL HISTORY: Seizure disorder, migraine headache, and multiple sclerosis and depression.   PAST SURGICAL HISTORY: Cholecystectomy, hysterectomy.   SOCIAL HISTORY: Quit smoking 6 months ago. Denies any alcohol drinking or illicit drugs.   FAMILY HISTORY: No hypertension, diabetes, heart attack, or stroke. No multiple sclerosis.   ALLERGIES: VICODIN.    HOME MEDICATIONS: Keppra 1000 mg p.o. b.i.Hood.  REVIEW OF SYSTEMS:  CONSTITUTIONAL: The patient denies any fever or chills but has a headache on the right side. No dizziness or generalized weakness but has left-sided weakness.  EYES: No double vision but has blurry vision.  ENT: No postnasal drip, but has mild slurred speech. No dysphagia or postnasal drip.  CARDIOVASCULAR: No chest pain, palpitation, orthopnea, or nocturnal dyspnea. No leg  edema.  PULMONARY: No cough, sputum, shortness of breath, or hemoptysis.  GASTROINTESTINAL: No abdominal pain, nausea, vomiting, diarrhea. No melena or bloody stool.  GENITOURINARY: No dysuria, hematuria, or incontinence.  SKIN: No rash or jaundice.  NEUROLOGIC: Right-sided headache. No syncope, loss of consciousness, or seizure, but has left-sided weakness and left foot numbness.  ENDOCRINE: No polyuria, polydipsia, heat or cold intolerance.  SKIN: No rash or jaundice.  HEMATOLOGY: No easy bleeding or bleeding.   PHYSICAL EXAMINATION:  VITAL SIGNS: Temperature 98.1, blood pressure 123/54, pulse 98, oxygen saturation 99% on room air.  GENERAL: The patient is alert, awake, oriented, in no acute distress.  HEENT: Pupils round, equal, reactive to light and accommodation. Moist oral mucosa. Clear oropharynx.  NECK: Supple. No JVD or carotid bruit. No lymphadenopathy. No thyromegaly.  CARDIOVASCULAR: S1, S2. Regular rate and rhythm. No murmurs or gallops.  PULMONARY: Bilateral air entry. No wheezing or rales. No use of accessory muscles to breathe.  ABDOMEN: Soft. No distention. No tenderness. No organomegaly. Bowel sounds present.  EXTREMITIES: No edema, clubbing, or cyanosis. No calf tenderness. Bilateral pedal pulses present.  SKIN: No rash or jaundice.  NEUROLOGIC: A and O x 3. Left upper and lower extremity weakness about 4/5, right side 5/5. Sensation intact. DTR 2+.   LABORATORY DATA: CBC in normal range. Glucose 93, BUN 19, creatinine 0.62. Electrolytes normal. CAT scan of head stable and normal noncontrast CT appearance of the brain.   IMPRESSION:  1.  Possible multiple sclerosis flare.  2.  Migraine headache.  3.  Seizure disorder.  4.  History of depression.   PLAN OF TREATMENT:  1.  The patient will be  admitted to medical floor. We will give Decadron and magnesium IV, 1 dose, and follow up with Dr. Loretha Brasil. We will start neurologic checks.  2.  For seizure disorder, get  seizure precautions and continue Keppra b.i.Hood.   I discussed the patient's condition and plan of treatment with the patient.   TIME SPENT: About 52 minutes.    ____________________________ Shaune Pollack, MD qc:ST Hood: 12/11/2014 11:43:25 ET T: 12/11/2014 12:05:41 ET JOB#: [id number]  cc: Shaune Pollack, MD, <Dictator> Shaune Pollack MD ELECTRONICALLY SIGNED 12/12/2014 20:55

## 2015-02-16 NOTE — Consult Note (Signed)
PATIENT NAME:  Andrea Hood, Andrea Hood MR#:  [id number] DATE OF BIRTH:  [birth year]  DATE OF CONSULTATION:  12/11/2014  CONSULTING PHYSICIAN:  Pauletta BrownsYuriy Lukah Goswami, MD  REASON FOR CONSULTATION:  Left-sided numbness.  HISTORY OF PRESENT ILLNESS:  This is a 39 year old female with a past medical history of  migraine, seizure disorder and multiple sclerosis that was diagnosed about 2 months ago due to  abnormal MRI by her primary neurologist. The patient was not  started on any medications for MS.  She presents with left upper and left lower extremity numbness. At that time, the patient was found also to have a headache that is right-sided pressure-like.  The headache has improved status post Decadron, Benadryl and 2 grams of magnesium. The numbness is improving but still not at baseline.   PAST MEDICAL HISTORY:  Seizure disorder, migraine headaches, multiple sclerosis and depression.   PAST SURGICAL HISTORY: Cholecystectomy and hysterectomy.   SOCIAL HISTORY: Quit smoking 6 months ago.   FAMILY HISTORY: No history of hypertension, diabetes or heart attacks.   REVIEW OF SYSTEMS:  Positive for weakness in the left upper and left lower extremity but that is improving. No shortness of breath, no chest pain and no abdominal pain. Positive history of  depression.   NEUROLOGIC EVALUATION: The patient is awake, alert and oriented to time, place, location, and reason why she is in the hospital. Facial sensation intact. Facial motor is intact Tongue is midline. Shoulder shrug intact.  Motor drift in the left upper extremity, slight weakness on the left lower extremity. Sensation is intact to light touch and temperature and the patient states she has slight decreased sensation in the left upper and left lower extremity.   IMPRESSION: A 39 year old female recently diagnosed with multiple sclerosis presents with left upper and left lower extremit weakness Differential includes multiple sclerosis versus complex migraine.    PLAN: Observe patient overnight. If symptoms do not improve overnight, the left upper extremity weakness is still present, would obtain MRI of the brain with and without contrast, as well as start the patient on steroids 500 mg of Solu-Medrol q.12 hours for 3 days and treat as a multiple sclerosis exacerbation.  If symptoms have resolved, would likely attribute to complex migraine and the patient can be discharged to followup as outpatient  neurologist.    Thank you; it was a pleasure seeing this patient.    ____________________________ Pauletta BrownsYuriy Marymargaret Kirker, MD yz:at Hood: 12/11/2014 16:57:08 ET T: 12/11/2014 17:55:06 ET JOB#: [id number]  cc: Pauletta BrownsYuriy Jahari Wiginton, MD, <Dictator> Pauletta BrownsYURIY Zayquan Bogard MD ELECTRONICALLY SIGNED 01/02/2015 12:07

## 2015-02-16 NOTE — Discharge Summary (Signed)
PATIENT NAME:  Andrea Hood, Andrea Hood MR#:  [id number] DATE OF BIRTH:  1975/11/04  DATE OF ADMISSION:  12/11/2014 DATE OF DISCHARGE:  12/12/2014  PRIMARY CARE PHYSICIAN: Her neurologist, Hae Elliot DallyWon Shin, MD.   FINAL DIAGNOSES: Left-sided numbness and weakness and seizure disorder.   MEDICATIONS ON DISCHARGE: Include Keppra 1000 mg twice a day.  FOLLOWUP: Follow up with your neurologist as scheduled, your medical doctor.  HOSPITAL COURSE: The patient came in with left-sided numbness and weakness, was admitted for observation. The patient had a questionable history of multiple sclerosis in the past, history of migraine, and also history of seizure. The patient still had symptoms on February 25 so an MRI of the brain was done, which was completely normal MRI of the brain with and without contrast. Other laboratory data on hospital course included a CT scan of the head which was negative. PT, INR, and PTT normal range. Magnesium 2.2, glucose 103, BUN 21, creatinine 0.56, sodium 148, potassium 3.6, chloride 114, CO2 of 25, calcium 8.5. CBC within normal limits. The patient's symptoms had improved a little bit. No problems with walking. Still had a little numbness on her left 2 fingers. I spoke with Dr. Loretha BrasilZeylikman, neurology. He reviewed the MRI. This is not multiple sclerosis. No old lesions seen. No active lesions seen. 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 This could be a migraine with neurological findings. Can follow up with her neurologist as outpatient. For seizure disorder, no changes in medication were made. She is on her Keppra.  TIME SPENT ON DISCHARGE: 35 minutes.   ____________________________ Herschell Dimesichard J. Renae GlossWieting, MD rjw:ST Hood: 12/12/2014 14:24:20 ET T: 12/13/2014 11:09:45 ET JOB#: [id number]  cc: Herschell Dimesichard J. Renae GlossWieting, MD, <Dictator> Hae Elliot DallyWon Shin, MD Salley ScarletICHARD J Dagny Fiorentino MD ELECTRONICALLY SIGNED 12/25/2014 10:45

## 2015-02-18 ENCOUNTER — Other Ambulatory Visit: Payer: Self-pay | Admitting: Orthopedic Surgery

## 2015-02-18 DIAGNOSIS — M5442 Lumbago with sciatica, left side: Secondary | ICD-10-CM

## 2015-02-28 ENCOUNTER — Ambulatory Visit: Payer: Self-pay

## 2015-03-08 ENCOUNTER — Ambulatory Visit: Admission: RE | Admit: 2015-03-08 | Payer: BLUE CROSS/BLUE SHIELD | Source: Ambulatory Visit

## 2015-04-07 ENCOUNTER — Emergency Department
Admission: EM | Admit: 2015-04-07 | Discharge: 2015-04-07 | Disposition: A | Discharge status: Home / Self Care | Payer: BLUE CROSS/BLUE SHIELD | Attending: Emergency Medicine | Admitting: Emergency Medicine

## 2015-04-07 ENCOUNTER — Encounter: Payer: Self-pay | Admitting: *Deleted

## 2015-04-07 ENCOUNTER — Emergency Department: Payer: BLUE CROSS/BLUE SHIELD

## 2015-04-07 DIAGNOSIS — S60459A Superficial foreign body of unspecified finger, initial encounter: Secondary | ICD-10-CM

## 2015-04-07 DIAGNOSIS — Z23 Encounter for immunization: Secondary | ICD-10-CM | POA: Insufficient documentation

## 2015-04-07 DIAGNOSIS — Y9289 Other specified places as the place of occurrence of the external cause: Secondary | ICD-10-CM | POA: Diagnosis not present

## 2015-04-07 DIAGNOSIS — Z720 Tobacco use: Secondary | ICD-10-CM | POA: Diagnosis not present

## 2015-04-07 DIAGNOSIS — Z79899 Other long term (current) drug therapy: Secondary | ICD-10-CM | POA: Diagnosis not present

## 2015-04-07 DIAGNOSIS — Y9389 Activity, other specified: Secondary | ICD-10-CM | POA: Insufficient documentation

## 2015-04-07 DIAGNOSIS — Y990 Civilian activity done for income or pay: Secondary | ICD-10-CM | POA: Diagnosis not present

## 2015-04-07 DIAGNOSIS — W458XXA Other foreign body or object entering through skin, initial encounter: Secondary | ICD-10-CM | POA: Insufficient documentation

## 2015-04-07 DIAGNOSIS — S60451A Superficial foreign body of left index finger, initial encounter: Secondary | ICD-10-CM | POA: Insufficient documentation

## 2015-04-07 MED ORDER — OXYCODONE-ACETAMINOPHEN 5-325 MG PO TABS
ORAL_TABLET | ORAL | Status: AC
  Administered 2015-04-07: 1 via ORAL
  Filled 2015-04-07: qty 1

## 2015-04-07 MED ORDER — OXYCODONE-ACETAMINOPHEN 5-325 MG PO TABS: 1.0000 | ORAL_TABLET | ORAL | Status: DC | PRN

## 2015-04-07 MED ORDER — OXYCODONE-ACETAMINOPHEN 5-325 MG PO TABS
1.0000 | ORAL_TABLET | Freq: Once | ORAL | Status: AC
  Administered 2015-04-07: 1 via ORAL

## 2015-04-07 MED ORDER — TETANUS-DIPHTHERIA TOXOIDS TD 5-2 LFU IM INJ
INJECTION | INTRAMUSCULAR | Status: AC
  Administered 2015-04-07: 0.5 mL via INTRAMUSCULAR
  Filled 2015-04-07: qty 0.5

## 2015-04-07 MED ORDER — LIDOCAINE HCL (PF) 1 % IJ SOLN
8.0000 mL | Freq: Once | INTRAMUSCULAR | Status: AC
  Administered 2015-04-07: 8 mL

## 2015-04-07 MED ORDER — LIDOCAINE HCL (PF) 1 % IJ SOLN
INTRAMUSCULAR | Status: AC
  Administered 2015-04-07: 8 mL
  Filled 2015-04-07: qty 10

## 2015-04-07 MED ORDER — TETANUS-DIPHTH-ACELL PERTUSSIS 5-2.5-18.5 LF-MCG/0.5 IM SUSP: 0.5000 mL | Freq: Once | INTRAMUSCULAR | Status: DC

## 2015-04-07 MED ORDER — CEPHALEXIN 500 MG PO CAPS: 500.0000 mg | ORAL_CAPSULE | Freq: Three times a day (TID) | ORAL | Status: DC

## 2015-04-07 NOTE — ED Notes (Signed)
States she has industrial staples in left index finger.

## 2015-04-07 NOTE — ED Provider Notes (Signed)
[facility] Emergency Department Provider Note  ____________________________________________  Time seen: 1607  I have reviewed the triage vital signs and the nursing notes.   HISTORY  Chief Complaint Finger Injury   HPI Andrea Hood is a 39 y.o. female is here with an industrial staple in her left index finger. 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 She is unable to bend her left index finger secondary to the staple. Currently her pain is 10 over 10.   Past Medical History  Diagnosis Date  . Seizures     Patient Active Problem List   Diagnosis Date Noted  . BACK PAIN 11/19/2010  . INSOMNIA-SLEEP DISORDER-UNSPEC 08/27/2010  . ANXIETY 03/24/2009  . EATING DISORDER, UNSPECIFIED 03/24/2009  . DEPRESSION 03/24/2009  . MIGRAINE HEADACHE 03/24/2009  . GERD 03/24/2009  . GASTRIC ULCER 03/24/2009  . FATIGUE 03/24/2009    Past Surgical History  Procedure Laterality Date  . Abdominal hysterectomy      Current Outpatient Rx  Name  Route  Sig  Dispense  Refill  . albuterol (PROVENTIL HFA;VENTOLIN HFA) 108 (90 BASE) MCG/ACT inhaler   Inhalation   Inhale 2 puffs into the lungs every 6 (six) hours as needed. Shortness of breath and wheezing          . cephALEXin (KEFLEX) 500 MG capsule   Oral   Take 1 capsule (500 mg total) by mouth 3 (three) times daily.   21 capsule   0   . cholecalciferol (VITAMIN D) 1000 UNITS tablet   Oral   Take 1,000 Units by mouth daily.           . clonazePAM (KLONOPIN) 1 MG tablet      TAKE 1 TABLET BY MOUTH TWICE DAILY   60 tablet   0   . gabapentin (NEURONTIN) 300 MG capsule   Oral   Take 300 mg by mouth at bedtime.           Marland Kitchen HYDROcodone-acetaminophen (NORCO) 5-325 MG per tablet   Oral   Take 1 tablet by mouth every 6 (six) hours as needed for moderate pain.   20 tablet   0   . HYDROcodone-acetaminophen (VICODIN) 5-500 MG per tablet      TAKE 1 TABLET BY MOUTH EVERY 6  HOURS AS NEEDED FOR PAIN   45 tablet   0   . LevETIRAcetam (KEPPRA PO)   Oral   Take by mouth.         . oxyCODONE-acetaminophen (PERCOCET) 5-325 MG per tablet   Oral   Take 1 tablet by mouth every 4 (four) hours as needed for severe pain.   15 tablet   0   . tiotropium (SPIRIVA) 18 MCG inhalation capsule   Inhalation   Place 18 mcg into inhaler and inhale daily.           . traMADol (ULTRAM) 50 MG tablet      TAKE 1 TABLET BY MOUTH FOUR TIMES DAILY AS NEEDED FOR PAIN   50 tablet   0   . vitamin B-12 (CYANOCOBALAMIN) 1000 MCG tablet   Oral   Take 1,000 mcg by mouth daily.             Allergies Hydrocodone  No family history on file.  Social History History  Substance Use Topics  . Smoking status: Current Every Day Smoker  . Smokeless tobacco: Not on file  . Alcohol Use: No    Review of  Systems Constitutional: No fever/chills Eyes: No visual changes. ENT: No sore throat. Cardiovascular: Denies chest pain. Respiratory: Denies shortness of breath. Gastrointestinal: No abdominal pain.  No nausea, no vomiting.Genitourinary: Negative for dysuria. Musculoskeletal: Negative for back pain. Skin: Negative for rash. Neurological: Negative for headaches 10-point ROS otherwise negative.  ____________________________________________   PHYSICAL EXAM:  VITAL SIGNS: ED Triage Vitals  Enc Vitals Group     BP 04/07/15 1531 123/67 mmHg     Pulse Rate 04/07/15 1531 82     Resp 04/07/15 1531 20     Temp 04/07/15 1531 98.1 F (36.7 C)     Temp Source 04/07/15 1531 Oral     SpO2 04/07/15 1531 100 %     Weight 04/07/15 1531 145 lb (65.772 kg)     Height 04/07/15 1531 5\' 5"  (1.651 m)     Head Cir --      Peak Flow --      Pain Score 04/07/15 1532 10     Pain Loc --      Pain Edu? --      Excl. in GC? --     Constitutional: Alert and oriented. Well appearing and in no acute distress. Eyes: Conjunctivae are normal. PERRL. EOMI. Head: Atraumatic. Nose: No  congestion/rhinnorhea. Neck: No stridor. Respiratory: Normal respiratory effort.  Gastrointestinal: Soft and nontender. No distention.. Musculoskeletal: No lower extremity tenderness nor edema.  No joint effusions.  Left index finger volar aspect currently has a industrial size staple in it. Patient is unable to flex her finger due to the position of staple. No active bleeding is noted at this time. Neurologic:  Normal speech and language. No gross focal neurologic deficits are appreciated. Speech is normal. No gait instability. Skin:  Skin is warm, dry and intact. Foreign body as above Psychiatric: Mood and affect are normal. Speech and behavior are normal.  ____________________________________________   LABS (all labs ordered are listed, but only abnormal results are displayed)  Labs Reviewed - No data to display RADIOLOGY  Per radiologist staples and soft tissue of the second at digit distally without any bony abnormality. ____________________________________________   PROCEDURES  Procedure(s) performed: 1% lidocaine was used for digital block. Approximately 7 cc was used. Area was prepped with alcohol swab prior to this. Finger was prepped with normal saline and Betadine. A flat head screwdriver was used to provide [place] taper up above the skin. Staples removed with a pair of pliers. There is no complications with this. Patient tolerated the procedure extremely well. Area was cleaned with Betadine after foreign body removal. There was minimal bleeding from the puncture wounds. Nail was not injured from the injury or further removal., see procedure note(s).  Critical Care performed: No  ____________________________________________   INITIAL IMPRESSION / ASSESSMENT AND PLAN / ED COURSE  Pertinent labs & imaging results that were available during my care of the patient were reviewed by me and considered in my medical decision making (see chart for details).  Patient was placed on  Keflex and Percocet as needed for pain. She is aware that she cannot take this while at work or while driving. She was given a of restrictions since this workman's comp. She is return to the emergency room if any signs of infection. ____________________________________________   FINAL CLINICAL IMPRESSION(S) / ED DIAGNOSES  Final diagnoses:  Foreign body of finger of left hand, initial encounter      Tommi Rumps, PA-C 04/07/15 1726  Phineas Semen, MD 04/07/15 [phone]

## 2015-05-10 ENCOUNTER — Ambulatory Visit: Payer: BLUE CROSS/BLUE SHIELD

## 2015-06-27 ENCOUNTER — Emergency Department
Admission: EM | Admit: 2015-06-27 | Discharge: 2015-06-27 | Disposition: A | Discharge status: Home / Self Care | Payer: BLUE CROSS/BLUE SHIELD | Attending: Student | Admitting: Student

## 2015-06-27 ENCOUNTER — Encounter: Payer: Self-pay | Admitting: Emergency Medicine

## 2015-06-27 DIAGNOSIS — Z791 Long term (current) use of non-steroidal anti-inflammatories (NSAID): Secondary | ICD-10-CM | POA: Diagnosis not present

## 2015-06-27 DIAGNOSIS — Y9389 Activity, other specified: Secondary | ICD-10-CM | POA: Diagnosis not present

## 2015-06-27 DIAGNOSIS — Y92009 Unspecified place in unspecified non-institutional (private) residence as the place of occurrence of the external cause: Secondary | ICD-10-CM | POA: Insufficient documentation

## 2015-06-27 DIAGNOSIS — Z792 Long term (current) use of antibiotics: Secondary | ICD-10-CM | POA: Diagnosis not present

## 2015-06-27 DIAGNOSIS — Y998 Other external cause status: Secondary | ICD-10-CM | POA: Diagnosis not present

## 2015-06-27 DIAGNOSIS — S39012A Strain of muscle, fascia and tendon of lower back, initial encounter: Secondary | ICD-10-CM

## 2015-06-27 DIAGNOSIS — Z79899 Other long term (current) drug therapy: Secondary | ICD-10-CM | POA: Diagnosis not present

## 2015-06-27 DIAGNOSIS — W109XXA Fall (on) (from) unspecified stairs and steps, initial encounter: Secondary | ICD-10-CM | POA: Insufficient documentation

## 2015-06-27 DIAGNOSIS — Z87891 Personal history of nicotine dependence: Secondary | ICD-10-CM | POA: Diagnosis not present

## 2015-06-27 DIAGNOSIS — H9203 Otalgia, bilateral: Secondary | ICD-10-CM | POA: Insufficient documentation

## 2015-06-27 DIAGNOSIS — W19XXXA Unspecified fall, initial encounter: Secondary | ICD-10-CM

## 2015-06-27 HISTORY — DX: Migraine, unspecified, not intractable, without status migrainosus: G43.909

## 2015-06-27 HISTORY — DX: Other disorders of pituitary gland: E23.6

## 2015-06-27 NOTE — Discharge Instructions (Signed)
Back Pain, Adult Low back pain is very common. About 1 in 5 people have back pain.The cause of low back pain is rarely dangerous. The pain often gets better over time.About half of people with a sudden onset of back pain feel better in just 2 weeks. About 8 in 10 people feel better by 6 weeks.  CAUSES Some common causes of back pain include:  Strain of the muscles or ligaments supporting the spine.  Wear and tear (degeneration) of the spinal discs.  Arthritis.  Direct injury to the back. DIAGNOSIS Most of the time, the direct cause of low back pain is not known.However, back pain can be treated effectively even when the exact cause of the pain is unknown.Answering your caregiver's questions about your overall health and symptoms is one of the most accurate ways to make sure the cause of your pain is not dangerous. If your caregiver needs more information, he or she may order lab work or imaging tests (X-rays or MRIs).However, even if imaging tests show changes in your back, this usually does not require surgery. HOME CARE INSTRUCTIONS For many people, back pain returns.Since low back pain is rarely dangerous, it is often a condition that people can learn to [facility] their own.   Remain active. It is stressful on the back to sit or stand in one place. Do not sit, drive, or stand in one place for more than 30 minutes at a time. Take short walks on level surfaces as soon as pain allows.Try to increase the length of time you walk each day.  Do not stay in bed.Resting more than 1 or 2 days can delay your recovery.  Do not avoid exercise or work.Your body is made to move.It is not dangerous to be active, even though your back may hurt.Your back will likely heal faster if you return to being active before your pain is gone.  Pay attention to your body when you bend and lift. Many people have less discomfortwhen lifting if they bend their knees, keep the load close to their bodies,and  avoid twisting. Often, the most comfortable positions are those that put less stress on your recovering back.  Find a comfortable position to sleep. Use a firm mattress and lie on your side with your knees slightly bent. If you lie on your back, put a pillow under your knees.  Only take over-the-counter or prescription medicines as directed by your caregiver. Over-the-counter medicines to reduce pain and inflammation are often the most helpful.Your caregiver may prescribe muscle relaxant drugs.These medicines help dull your pain so you can more quickly return to your normal activities and healthy exercise.  Put ice on the injured area.  Put ice in a plastic bag.  Place a towel between your skin and the bag.  Leave the ice on for 15-20 minutes, 03-04 times a day for the first 2 to 3 days. After that, ice and heat may be alternated to reduce pain and spasms.  Ask your caregiver about trying back exercises and gentle massage. This may be of some benefit.  Avoid feeling anxious or stressed.Stress increases muscle tension and can worsen back pain.It is important to recognize when you are anxious or stressed and learn ways to manage it.Exercise is a great option. SEEK MEDICAL CARE IF:  You have pain that is not relieved with rest or medicine.  You have pain that does not improve in 1 week.  You have new symptoms.  You are generally not feeling well. SEEK  IMMEDIATE MEDICAL CARE IF:   You have pain that radiates from your back into your legs.  You develop new bowel or bladder control problems.  You have unusual weakness or numbness in your arms or legs.  You develop nausea or vomiting.  You develop abdominal pain.  You feel faint. Document Released: 10/04/2005 Document Revised: 04/04/2012 Document Reviewed: 02/05/2014 [facility] Patient Information 2015 [place], [place]. This information is not intended to replace advice given to you by your health care provider. Make sure you  discuss any questions you have with your health care provider.  Otalgia Otalgia is pain in or around the ear. When the pain is from the ear itself it is called primary otalgia. Pain may also be coming from somewhere else, like the head and neck. This is called secondary otalgia.  CAUSES  Causes of primary otalgia include:  Middle ear infection.  It can also be caused by injury to the ear or infection of the ear canal (swimmer's ear). Swimmer's ear causes pain, swelling and often drainage from the ear canal. Causes of secondary otalgia include:  Sinus infections.  Allergies and colds that cause stuffiness of the nose and tubes that drain the ears (eustachian tubes).  Dental problems like cavities, gum infections or teething.  Sore Throat (tonsillitis and pharyngitis).  Swollen glands in the neck.  Infection of the bone behind the ear (mastoiditis).  TMJ discomfort (problems with the joint between your jaw and your skull).  Other problems such as nerve disorders, circulation problems, heart disease and tumors of the head and neck can also cause symptoms of ear pain. This is rare. DIAGNOSIS  Evaluation, Diagnosis and Testing:  Examination by your medical caregiver is recommended to evaluate and diagnose the cause of otalgia.  Further testing or referral to a specialist may be indicated if the cause of the ear pain is not found and the symptom persists. TREATMENT   Your doctor may prescribe antibiotics if an ear infection is diagnosed.  Pain relievers and topical analgesics may be recommended.  It is important to take all medications as prescribed. HOME CARE INSTRUCTIONS   It may be helpful to sleep with the painful ear in the up position.  A warm compress over the painful ear may provide relief.  A soft diet and avoiding gum may help while ear pain is present. SEEK IMMEDIATE MEDICAL CARE IF:  You develop severe pain, a high fever, repeated vomiting or dehydration.  You  develop extreme dizziness, headache, confusion, ringing in the ears (tinnitus) or hearing loss. Document Released: 11/11/2004 Document Revised: 12/27/2011 Document Reviewed: 08/13/2009 [facility] Patient Information 2015 [place], [place]. This information is not intended to replace advice given to you by your health care provider. Make sure you discuss any questions you have with your health care provider.  Use OTC allergy medicine + decongestant for ear & sinus pressure relief. Consider using nasal spray as needed. Follow-up with your provider or specialist as needed.

## 2015-06-27 NOTE — ED Notes (Signed)
Pt comes into the ED c/o earache, neck discomfort, sore throat, and back pain.  Patient states this started 2 days ago.  Patient's back is hurting from a fall down the stairs a couple of weeks ago.

## 2015-06-29 ENCOUNTER — Emergency Department
Admission: EM | Admit: 2015-06-29 | Discharge: 2015-06-29 | Disposition: A | Discharge status: Home / Self Care | Payer: BLUE CROSS/BLUE SHIELD | Attending: Emergency Medicine | Admitting: Emergency Medicine

## 2015-06-29 ENCOUNTER — Emergency Department: Payer: BLUE CROSS/BLUE SHIELD

## 2015-06-29 DIAGNOSIS — Z7951 Long term (current) use of inhaled steroids: Secondary | ICD-10-CM | POA: Diagnosis not present

## 2015-06-29 DIAGNOSIS — Z87891 Personal history of nicotine dependence: Secondary | ICD-10-CM | POA: Diagnosis not present

## 2015-06-29 DIAGNOSIS — Z79899 Other long term (current) drug therapy: Secondary | ICD-10-CM | POA: Diagnosis not present

## 2015-06-29 DIAGNOSIS — S90111A Contusion of right great toe without damage to nail, initial encounter: Secondary | ICD-10-CM | POA: Insufficient documentation

## 2015-06-29 DIAGNOSIS — Y998 Other external cause status: Secondary | ICD-10-CM | POA: Insufficient documentation

## 2015-06-29 DIAGNOSIS — S93401A Sprain of unspecified ligament of right ankle, initial encounter: Secondary | ICD-10-CM | POA: Diagnosis not present

## 2015-06-29 DIAGNOSIS — S90121A Contusion of right lesser toe(s) without damage to nail, initial encounter: Secondary | ICD-10-CM

## 2015-06-29 DIAGNOSIS — Y9389 Activity, other specified: Secondary | ICD-10-CM | POA: Insufficient documentation

## 2015-06-29 DIAGNOSIS — Y9289 Other specified places as the place of occurrence of the external cause: Secondary | ICD-10-CM | POA: Diagnosis not present

## 2015-06-29 DIAGNOSIS — W109XXA Fall (on) (from) unspecified stairs and steps, initial encounter: Secondary | ICD-10-CM | POA: Insufficient documentation

## 2015-06-29 DIAGNOSIS — S99911A Unspecified injury of right ankle, initial encounter: Secondary | ICD-10-CM | POA: Diagnosis present

## 2015-06-29 DIAGNOSIS — Z791 Long term (current) use of non-steroidal anti-inflammatories (NSAID): Secondary | ICD-10-CM | POA: Insufficient documentation

## 2015-06-29 MED ORDER — HYDROCODONE-ACETAMINOPHEN 5-325 MG PO TABS: 1.0000 | ORAL_TABLET | ORAL | Status: DC | PRN

## 2015-06-29 MED ORDER — NAPROXEN 500 MG PO TABS
500.0000 mg | ORAL_TABLET | Freq: Once | ORAL | Status: AC
  Administered 2015-06-29: 500 mg via ORAL
  Filled 2015-06-29: qty 1

## 2015-06-29 MED ORDER — NAPROXEN 500 MG PO TABS: 500.0000 mg | ORAL_TABLET | Freq: Two times a day (BID) | ORAL | Status: AC

## 2015-06-29 NOTE — ED Notes (Signed)
Pt present with c/o right ankle/foot pain  She fell down two steps and injured the before mentioned area  Bruising/redness noted to great toe

## 2015-06-29 NOTE — Discharge Instructions (Signed)

## 2015-06-29 NOTE — ED Notes (Signed)
States she fell yesterday .having pain to right great toe and lateral foot area.

## 2015-06-29 NOTE — ED Provider Notes (Signed)
[facility] Emergency Department Provider Note ----------------------------------------------------------------------------------------------------------------------------------------                                                                                                                      ____________________________________________  Time seen: Approximately 9:34 AM  I have reviewed the triage vital signs and the nursing notes.   HISTORY  Chief Complaint Fall   HPI Andrea Hood is a 39 y.o. female who presents to the emergency department for evaluation of right ankle and great toe. She reports falling yesterday at her dad's pool. Her toe has swollen and is very tender. She's not taken anything this morning for pain. Pain is worse with ambulation.   Past Medical History  Diagnosis Date  . Seizures   . Pituitary cyst   . Migraines     Patient Active Problem List   Diagnosis Date Noted  . BACK PAIN 11/19/2010  . INSOMNIA-SLEEP DISORDER-UNSPEC 08/27/2010  . ANXIETY 03/24/2009  . EATING DISORDER, UNSPECIFIED 03/24/2009  . DEPRESSION 03/24/2009  . MIGRAINE HEADACHE 03/24/2009  . GERD 03/24/2009  . GASTRIC ULCER 03/24/2009  . FATIGUE 03/24/2009    Past Surgical History  Procedure Laterality Date  . Abdominal hysterectomy    . Cholecystectomy      Current Outpatient Rx  Name  Route  Sig  Dispense  Refill  . albuterol (PROVENTIL HFA;VENTOLIN HFA) 108 (90 BASE) MCG/ACT inhaler   Inhalation   Inhale 2 puffs into the lungs every 6 (six) hours as needed. Shortness of breath and wheezing          . cephALEXin (KEFLEX) 500 MG capsule   Oral   Take 1 capsule (500 mg total) by mouth 3 (three) times daily.   21 capsule   0   . cholecalciferol (VITAMIN D) 1000 UNITS tablet   Oral   Take 1,000 Units by mouth daily.           . clonazePAM (KLONOPIN) 1 MG tablet      TAKE 1 TABLET BY MOUTH TWICE DAILY   60 tablet   0   . gabapentin  (NEURONTIN) 300 MG capsule   Oral   Take 300 mg by mouth at bedtime.           Marland Kitchen HYDROcodone-acetaminophen (NORCO/VICODIN) 5-325 MG per tablet   Oral   Take 1 tablet by mouth every 4 (four) hours as needed.   12 tablet   0   . LevETIRAcetam (KEPPRA PO)   Oral   Take by mouth.         . naproxen (NAPROSYN) 500 MG tablet   Oral   Take 1 tablet (500 mg total) by mouth 2 (two) times daily with a meal.   60 tablet   2   . tiotropium (SPIRIVA) 18 MCG inhalation capsule   Inhalation   Place 18 mcg into inhaler and inhale daily.           . vitamin B-12 (CYANOCOBALAMIN) 1000 MCG tablet  Oral   Take 1,000 mcg by mouth daily.             Allergies Lamictal and Hydrocodone  No family history on file.  Social History Social History  Substance Use Topics  . Smoking status: Former Games developer  . Smokeless tobacco: Not on file  . Alcohol Use: No    Review of Systems Constitutional: No recent illness. Eyes: No visual changes. ENT: No sore throat. Cardiovascular: Denies chest pain or palpitations. Respiratory: Denies shortness of breath. Gastrointestinal: No abdominal pain.  Genitourinary: Negative for dysuria. Musculoskeletal: Pain in right great toe and ankle. Skin: Negative for rash. Neurological: Negative for headaches, focal weakness or numbness. 10-point ROS otherwise negative.  ____________________________________________   PHYSICAL EXAM:  VITAL SIGNS: ED Triage Vitals  Enc Vitals Group     BP 06/29/15 0922 116/75 mmHg     Pulse Rate 06/29/15 0921 108     Resp --      Temp 06/29/15 0921 98.2 F (36.8 C)     Temp Source 06/29/15 0921 Oral     SpO2 06/29/15 0922 100 %     Weight 06/29/15 0922 158 lb (71.668 kg)     Height 06/29/15 0922 5\' 5"  (1.651 m)     Head Cir --      Peak Flow --      Pain Score 06/29/15 0922 9     Pain Loc --      Pain Edu? --      Excl. in GC? --     Constitutional: Alert and oriented. Well appearing and in no acute  distress. Eyes: Conjunctivae are normal. EOMI. Head: Atraumatic. Nose: No congestion/rhinnorhea. Neck: No stridor.  Respiratory: Normal respiratory effort.   Musculoskeletal: Right great toe erythematous and swollen with ecchymosis. Right ankle is mildly swollen on the lateral aspect. Right foot appears normal. Neurologic:  Normal speech and language. No gross focal neurologic deficits are appreciated. Speech is normal. No gait instability. Skin:  Skin is warm, dry and intact. Atraumatic. Psychiatric: Mood and affect are normal. Speech and behavior are normal.  ____________________________________________   LABS (all labs ordered are listed, but only abnormal results are displayed)  Labs Reviewed - No data to display ____________________________________________  RADIOLOGY  No fracture identified.   ____________________________________________   PROCEDURES  Procedure(s) performed:  ACE bandage applied to right foot and ankle by ER Tech. Neurovascularly intact post application.   ____________________________________________   INITIAL IMPRESSION / ASSESSMENT AND PLAN / ED COURSE  Pertinent labs & imaging results that were available during my care of the patient were reviewed by me and considered in my medical decision making (see chart for details).  Patient was advised to follow up with the primary care provider for symptoms that are not improving over the next 7 days. She was advised to return to the emergency department for symptoms that change or worsen if unable to schedule an appointment with the primary care provider or specialist. ____________________________________________   FINAL CLINICAL IMPRESSION(S) / ED DIAGNOSES  Final diagnoses:  Ankle sprain, right, initial encounter  Contusion, toe, right, initial encounter      Chinita Pester, FNP 06/29/15 1448  Minna Antis, MD 06/29/15 1456

## 2015-07-02 NOTE — ED Provider Notes (Signed)
[facility] Emergency Department Provider Note ____________________________________________  Time seen: 1605  I have reviewed the triage vital signs and the nursing notes.  HISTORY  Chief Complaint  Otalgia; Sore Throat; and Back Pain  HPI Andrea Hood is a 39 y.o. female reports to the ED for evaluation of symptoms including left greater than right earache and some decreased hearing. She also notes some mild sore throat. She completed a 10 day course of amoxicillin 2 weeks prior after seeing her primary care provider. She's been dosing TheraFlu without significant improvement to her symptoms. Additionally she has some continued lumbar sacral strain following a fall at home.  Past Medical History  Diagnosis Date  . Seizures   . Pituitary cyst   . Migraines     Patient Active Problem List   Diagnosis Date Noted  . BACK PAIN 11/19/2010  . INSOMNIA-SLEEP DISORDER-UNSPEC 08/27/2010  . ANXIETY 03/24/2009  . EATING DISORDER, UNSPECIFIED 03/24/2009  . DEPRESSION 03/24/2009  . MIGRAINE HEADACHE 03/24/2009  . GERD 03/24/2009  . GASTRIC ULCER 03/24/2009  . FATIGUE 03/24/2009    Past Surgical History  Procedure Laterality Date  . Abdominal hysterectomy    . Cholecystectomy      Current Outpatient Rx  Name  Route  Sig  Dispense  Refill  . albuterol (PROVENTIL HFA;VENTOLIN HFA) 108 (90 BASE) MCG/ACT inhaler   Inhalation   Inhale 2 puffs into the lungs every 6 (six) hours as needed. Shortness of breath and wheezing          . cephALEXin (KEFLEX) 500 MG capsule   Oral   Take 1 capsule (500 mg total) by mouth 3 (three) times daily.   21 capsule   0   . cholecalciferol (VITAMIN D) 1000 UNITS tablet   Oral   Take 1,000 Units by mouth daily.           . clonazePAM (KLONOPIN) 1 MG tablet      TAKE 1 TABLET BY MOUTH TWICE DAILY   60 tablet   0   . gabapentin (NEURONTIN) 300 MG capsule   Oral   Take 300 mg by mouth at bedtime.           Marland Kitchen  HYDROcodone-acetaminophen (NORCO/VICODIN) 5-325 MG per tablet   Oral   Take 1 tablet by mouth every 4 (four) hours as needed.   12 tablet   0   . LevETIRAcetam (KEPPRA PO)   Oral   Take by mouth.         . naproxen (NAPROSYN) 500 MG tablet   Oral   Take 1 tablet (500 mg total) by mouth 2 (two) times daily with a meal.   60 tablet   2   . tiotropium (SPIRIVA) 18 MCG inhalation capsule   Inhalation   Place 18 mcg into inhaler and inhale daily.           . vitamin B-12 (CYANOCOBALAMIN) 1000 MCG tablet   Oral   Take 1,000 mcg by mouth daily.            Allergies Lamictal and Hydrocodone  No family history on file.  Social History Social History  Substance Use Topics  . Smoking status: Former Games developer  . Smokeless tobacco: None  . Alcohol Use: No   Review of Systems  Constitutional: Negative for fever. Eyes: Negative for visual changes. ENT: Negative for sore throat. Reports bilateral earaches Cardiovascular: Negative for chest pain. Respiratory: Negative for shortness of breath. Gastrointestinal: Negative for abdominal  pain, vomiting and diarrhea. Genitourinary: Negative for dysuria. Musculoskeletal: Positive for back pain. Skin: Negative for rash. Neurological: Negative for headaches, focal weakness or numbness. ____________________________________________  PHYSICAL EXAM:  VITAL SIGNS: ED Triage Vitals  Enc Vitals Group     BP 06/27/15 1541 136/82 mmHg     Pulse Rate 06/27/15 1541 106     Resp 06/27/15 1541 20     Temp 06/27/15 1541 98.2 F (36.8 C)     Temp Source 06/27/15 1541 Oral     SpO2 06/27/15 1541 98 %     Weight 06/27/15 1541 160 lb (72.576 kg)     Height 06/27/15 1541 5\' 5"  (1.651 m)     Head Cir --      Peak Flow --      Pain Score 06/27/15 1542 8     Pain Loc --      Pain Edu? --      Excl. in GC? --    Constitutional: Alert and oriented. Well appearing and in no distress. Eyes: Conjunctivae are normal. PERRL. Normal extraocular  movements. Ears: Canals clear and TMs intact without effusion, or infection.   Head: Normocephalic and atraumatic.   Nose: No congestion/rhinorrhea.   Mouth/Throat: Mucous membranes are moist.   Neck: Supple. No thyromegaly. Hematological/Lymphatic/Immunological: No cervical lymphadenopathy. Cardiovascular: Normal rate, regular rhythm.  Respiratory: Normal respiratory effort. No wheezes/rales/rhonchi. Gastrointestinal: Soft and nontender. No distention. Musculoskeletal: Nontender with normal range of motion in all extremities.  Neurologic:  Normal gait without ataxia. Normal speech and language. No gross focal neurologic deficits are appreciated. Skin:  Skin is warm, dry and intact. No rash noted. Psychiatric: Mood and affect are normal. Patient exhibits appropriate insight and judgment. ____________________________________________  INITIAL IMPRESSION / ASSESSMENT AND PLAN / ED COURSE  Bilateral otalgia without signs of infection. Lumbar sacral strain and contusion following fall at home. Treatment with prescription Tessalon Perles & Flonase. Dose OTC allergy medicine+decongestant. Suggest follow-up with ENT for ongoing hearing issues.  __________________________________________ 16:30 Patient left without discharge orders or RX meds. She verbalized she did not agree with assessment of normal ENT exam. ____________________________________________  FINAL CLINICAL IMPRESSION(S) / ED DIAGNOSES  Final diagnoses:  Otalgia of both ears  Fall at home, initial encounter  Lumbar strain, initial encounter     Lissa Hoard, PA-C 07/02/15 2024  Gayla Doss, MD 07/03/15 [phone]

## 2016-03-11 ENCOUNTER — Encounter: Payer: Self-pay | Admitting: Emergency Medicine

## 2016-03-11 ENCOUNTER — Emergency Department: Payer: Self-pay

## 2016-03-11 ENCOUNTER — Emergency Department
Admission: EM | Admit: 2016-03-11 | Discharge: 2016-03-11 | Disposition: A | Discharge status: Home / Self Care | Payer: Self-pay | Attending: Emergency Medicine | Admitting: Emergency Medicine

## 2016-03-11 DIAGNOSIS — Y9301 Activity, walking, marching and hiking: Secondary | ICD-10-CM | POA: Insufficient documentation

## 2016-03-11 DIAGNOSIS — Y929 Unspecified place or not applicable: Secondary | ICD-10-CM | POA: Insufficient documentation

## 2016-03-11 DIAGNOSIS — S4991XA Unspecified injury of right shoulder and upper arm, initial encounter: Secondary | ICD-10-CM | POA: Insufficient documentation

## 2016-03-11 DIAGNOSIS — W1800XA Striking against unspecified object with subsequent fall, initial encounter: Secondary | ICD-10-CM | POA: Insufficient documentation

## 2016-03-11 DIAGNOSIS — Y999 Unspecified external cause status: Secondary | ICD-10-CM | POA: Insufficient documentation

## 2016-03-11 DIAGNOSIS — Z5321 Procedure and treatment not carried out due to patient leaving prior to being seen by health care provider: Secondary | ICD-10-CM | POA: Insufficient documentation

## 2016-03-11 NOTE — ED Notes (Signed)
Informed PA that she needed to leave to pick up child at 2 pm

## 2016-03-11 NOTE — ED Notes (Signed)
States she was walking her dog in the rain and fell  Landed on right side   Having pain to right shoulder and hit  Ambulates well no deformity noted

## 2016-03-11 NOTE — ED Notes (Addendum)
Right shoulder pain after slipping with her dog on the leash yesterday.

## 2016-08-04 ENCOUNTER — Emergency Department
Admission: EM | Admit: 2016-08-04 | Discharge: 2016-08-04 | Disposition: A | Discharge status: Home / Self Care | Payer: BLUE CROSS/BLUE SHIELD | Attending: Student in an Organized Health Care Education/Training Program | Admitting: Student in an Organized Health Care Education/Training Program

## 2016-08-04 ENCOUNTER — Emergency Department: Payer: BLUE CROSS/BLUE SHIELD

## 2016-08-04 DIAGNOSIS — Y939 Activity, unspecified: Secondary | ICD-10-CM | POA: Insufficient documentation

## 2016-08-04 DIAGNOSIS — Y999 Unspecified external cause status: Secondary | ICD-10-CM | POA: Insufficient documentation

## 2016-08-04 DIAGNOSIS — Z79899 Other long term (current) drug therapy: Secondary | ICD-10-CM | POA: Insufficient documentation

## 2016-08-04 DIAGNOSIS — S60211A Contusion of right wrist, initial encounter: Secondary | ICD-10-CM | POA: Insufficient documentation

## 2016-08-04 DIAGNOSIS — Y929 Unspecified place or not applicable: Secondary | ICD-10-CM | POA: Insufficient documentation

## 2016-08-04 DIAGNOSIS — W230XXA Caught, crushed, jammed, or pinched between moving objects, initial encounter: Secondary | ICD-10-CM | POA: Insufficient documentation

## 2016-08-04 DIAGNOSIS — S60221A Contusion of right hand, initial encounter: Secondary | ICD-10-CM

## 2016-08-04 DIAGNOSIS — Z87891 Personal history of nicotine dependence: Secondary | ICD-10-CM | POA: Insufficient documentation

## 2016-08-04 DIAGNOSIS — Z792 Long term (current) use of antibiotics: Secondary | ICD-10-CM | POA: Insufficient documentation

## 2016-08-04 MED ORDER — OXYCODONE-ACETAMINOPHEN 5-325 MG PO TABS
1.0000 | ORAL_TABLET | Freq: Once | ORAL | Status: AC
  Administered 2016-08-04: 1 via ORAL
  Filled 2016-08-04: qty 1

## 2016-08-04 MED ORDER — TRAMADOL HCL 50 MG PO TABS: 50.0000 mg | ORAL_TABLET | Freq: Once | ORAL | Status: DC

## 2016-08-04 MED ORDER — NAPROXEN 500 MG PO TABS
500.0000 mg | ORAL_TABLET | Freq: Once | ORAL | Status: AC
  Administered 2016-08-04: 500 mg via ORAL
  Filled 2016-08-04: qty 1

## 2016-08-04 MED ORDER — NAPROXEN 500 MG PO TABS: 500.0000 mg | ORAL_TABLET | Freq: Two times a day (BID) | ORAL | 0 refills | Status: DC

## 2016-08-04 MED ORDER — OXYCODONE-ACETAMINOPHEN 5-325 MG PO TABS: 1.0000 | ORAL_TABLET | Freq: Four times a day (QID) | ORAL | 0 refills | Status: DC | PRN

## 2016-08-04 NOTE — ED Provider Notes (Signed)
[facility] Emergency Department Provider Note   ____________________________________________   None    (approximate)  I have reviewed the triage vital signs and the nursing notes.   HISTORY  Chief Complaint Hand Injury   HPI Andrea Hood is a 40 y.o. female presents with a right hand injury that happened two days ago when her right hand got shut in a truck door.  The door completely shut and had to be reopened to release her hand. The pain is most tender on the lateral posterior part of her hand and radiates up to her fourth and fifth MCP and into the distal ulnar bone.  She took Ibuprofen 800mg  BID and alternating Tylenol 500 BID with rest and ice for the past two days.  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  She came in today due to increased swelling and pain.  She admits to ecchymosis that is getting worse.  States her pain is 7.5/10.     Past Medical History:  Diagnosis Date  . Migraines   . Pituitary cyst (HCC)   . Seizures [facility])     Patient Active Problem List   Diagnosis Date Noted  . BACK PAIN 11/19/2010  . INSOMNIA-SLEEP DISORDER-UNSPEC 08/27/2010  . ANXIETY 03/24/2009  . EATING DISORDER, UNSPECIFIED 03/24/2009  . DEPRESSION 03/24/2009  . MIGRAINE HEADACHE 03/24/2009  . GERD 03/24/2009  . GASTRIC ULCER 03/24/2009  . FATIGUE 03/24/2009    Past Surgical History:  Procedure Laterality Date  . ABDOMINAL HYSTERECTOMY    . CHOLECYSTECTOMY      Prior to Admission medications   Medication Sig Start Date End Date Taking? Authorizing Provider  albuterol (PROVENTIL HFA;VENTOLIN HFA) 108 (90 BASE) MCG/ACT inhaler Inhale 2 puffs into the lungs every 6 (six) hours as needed. Shortness of breath and wheezing     Historical Provider, MD  cephALEXin (KEFLEX) 500 MG capsule Take 1 capsule (500 mg total) by mouth 3 (three) times daily. 04/07/15   Tommi Rumps, PA-C  cholecalciferol (VITAMIN D)  1000 UNITS tablet Take 1,000 Units by mouth daily.      Historical Provider, MD  clonazePAM (KLONOPIN) 1 MG tablet TAKE 1 TABLET BY MOUTH TWICE DAILY 02/07/11   Hannah Beat, MD  gabapentin (NEURONTIN) 300 MG capsule Take 300 mg by mouth at bedtime.      Historical Provider, MD  HYDROcodone-acetaminophen (NORCO/VICODIN) 5-325 MG per tablet Take 1 tablet by mouth every 4 (four) hours as needed. 06/29/15   Chinita Pester, FNP  LevETIRAcetam (KEPPRA PO) Take by mouth.    Historical Provider, MD  naproxen (NAPROSYN) 500 MG tablet Take 1 tablet (500 mg total) by mouth 2 (two) times daily with a meal. 08/04/16   Joni Reining, PA-C  oxyCODONE-acetaminophen (ROXICET) 5-325 MG tablet Take 1 tablet by mouth every 6 (six) hours as needed. 08/04/16 08/04/17  Joni Reining, PA-C  tiotropium (SPIRIVA) 18 MCG inhalation capsule Place 18 mcg into inhaler and inhale daily.      Historical Provider, MD  vitamin B-12 (CYANOCOBALAMIN) 1000 MCG tablet Take 1,000 mcg by mouth daily.      Historical Provider, MD    Allergies Lamictal [lamotrigine] and Hydrocodone  No family history on file.  Social History Social History  Substance Use Topics  . Smoking status: Former Games developer  . Smokeless tobacco: Never Used  . Alcohol use Yes    Review of Systems Constitutional: No fever/chills Eyes:  No visual changes. ENT: No sore throat. Cardiovascular: Denies chest pain. Respiratory: Denies shortness of breath. Gastrointestinal: No abdominal pain.  No nausea, no vomiting.  No diarrhea.  No constipation. Genitourinary: Negative for dysuria. Musculoskeletal: positive for pain on the posterior and lateral portion of her right hand. Skin: Negative for rash. Neurological: Negative for headaches, focal weakness or numbness. 10-point ROS otherwise negative.  ____________________________________________   PHYSICAL EXAM:  VITAL SIGNS: ED Triage Vitals  Enc Vitals Group     BP 08/04/16 1124 124/76     Pulse Rate  08/04/16 1124 87     Resp 08/04/16 1124 20     Temp 08/04/16 1124 98.2 F (36.8 C)     Temp Source 08/04/16 1124 Oral     SpO2 08/04/16 1124 99 %     Weight 08/04/16 1124 148 lb (67.1 kg)     Height 08/04/16 1124 5\' 4"  (1.626 m)     Head Circumference --      Peak Flow --      Pain Score 08/04/16 1128 8     Pain Loc --      Pain Edu? --      Excl. in GC? --     Constitutional: Alert and oriented. Well appearing and in no acute distress. Eyes: Conjunctivae are normal. PERRL. EOMI. Head: Atraumatic. Nose: No congestion/rhinnorhea. Mouth/Throat: Mucous membranes are moist.  Oropharynx non-erythematous. Neck: No stridor.   Cardiovascular: Normal rate, regular rhythm. Grossly normal heart sounds.  Good peripheral circulation. Respiratory: Normal respiratory effort.  No retractions. Lungs CTAB. Gastrointestinal: Soft and nontender. No distention. No abdominal bruits. No CVA tenderness. Musculoskeletal: No lower extremity tenderness nor edema.  No joint effusions. Muscle strength is 2/5 on right wrist flexion, extension, inversion and eversion. Muscle strength is 3/5 on right fingers abduction and adduction.  Swelling in the right hand and up to the wrist is noted on exam.  Minimal ecchymosis on the right wrist. Tender to palpation on the fourth and fifth MCP and on the lateral posterior portion of the right hand up until the distal ulnar bone.  Neurologic:  Normal speech and language. No gross focal neurologic deficits are appreciated. No gait instability. Skin:  Skin is warm, dry and intact. No rash noted. Psychiatric: Mood and affect are normal. Speech and behavior are normal.  ____________________________________________  RADIOLOGY  No acute findings x-ray of the right hand. ____________________________________________   PROCEDURES  Procedure(s) performed: None  Procedures  Critical Care performed: No  ____________________________________________   INITIAL IMPRESSION /  ASSESSMENT AND PLAN / ED COURSE  Pertinent labs & imaging results that were available during my care of the patient were reviewed by me and considered in my medical decision making (see chart for details).  Right hand contusion. Discussed negative x-ray finding with patient. Patient placed in a OCL splint. Patient get a prescription for naproxen and Percocets. Patient advised to follow-up with PCP for continual care.  Clinical Course     ____________________________________________   FINAL CLINICAL IMPRESSION(S) / ED DIAGNOSES  Final diagnoses:  Contusion of right hand, initial encounter      NEW MEDICATIONS STARTED DURING THIS VISIT:  New Prescriptions   NAPROXEN (NAPROSYN) 500 MG TABLET    Take 1 tablet (500 mg total) by mouth 2 (two) times daily with a meal.   OXYCODONE-ACETAMINOPHEN (ROXICET) 5-325 MG TABLET    Take 1 tablet by mouth every 6 (six) hours as needed.     Note:  This document was prepared  using Conservation officer, historic buildingsDragon voice recognition software and may include unintentional dictation errors.   Joni Reiningonald K Kyira Volkert, PA-C 08/04/16 1213    Willy EddyPatrick Robinson, MD 08/04/16 1535

## 2016-08-04 NOTE — Discharge Instructions (Signed)
Wear splint for 3-5 days as needed. °

## 2016-08-04 NOTE — ED Notes (Signed)
Pt alert and oriented X4, active, cooperative, pt in NAD. RR even and unlabored, color WNL.  Pt informed to return if any life threatening symptoms occur.   

## 2016-08-04 NOTE — ED Triage Notes (Signed)
Right hand pain X 2 days after shutting hand in door. Pulses palpable, color WNL. Swelling present. CMS and ROM intact.

## 2016-09-04 ENCOUNTER — Emergency Department
Admission: EM | Admit: 2016-09-04 | Discharge: 2016-09-04 | Disposition: A | Discharge status: Home / Self Care | Payer: BLUE CROSS/BLUE SHIELD | Attending: Emergency Medicine | Admitting: Emergency Medicine

## 2016-09-04 ENCOUNTER — Emergency Department: Payer: BLUE CROSS/BLUE SHIELD

## 2016-09-04 ENCOUNTER — Encounter: Payer: Self-pay | Admitting: Emergency Medicine

## 2016-09-04 DIAGNOSIS — Z79899 Other long term (current) drug therapy: Secondary | ICD-10-CM | POA: Insufficient documentation

## 2016-09-04 DIAGNOSIS — W19XXXA Unspecified fall, initial encounter: Secondary | ICD-10-CM

## 2016-09-04 DIAGNOSIS — S39012A Strain of muscle, fascia and tendon of lower back, initial encounter: Secondary | ICD-10-CM | POA: Insufficient documentation

## 2016-09-04 DIAGNOSIS — W108XXA Fall (on) (from) other stairs and steps, initial encounter: Secondary | ICD-10-CM | POA: Insufficient documentation

## 2016-09-04 DIAGNOSIS — S5001XA Contusion of right elbow, initial encounter: Secondary | ICD-10-CM | POA: Insufficient documentation

## 2016-09-04 DIAGNOSIS — Y929 Unspecified place or not applicable: Secondary | ICD-10-CM | POA: Insufficient documentation

## 2016-09-04 DIAGNOSIS — Y999 Unspecified external cause status: Secondary | ICD-10-CM | POA: Insufficient documentation

## 2016-09-04 DIAGNOSIS — Y9389 Activity, other specified: Secondary | ICD-10-CM | POA: Insufficient documentation

## 2016-09-04 DIAGNOSIS — Z87891 Personal history of nicotine dependence: Secondary | ICD-10-CM | POA: Insufficient documentation

## 2016-09-04 HISTORY — DX: Gastric ulcer, unspecified as acute or chronic, without hemorrhage or perforation: K25.9

## 2016-09-04 MED ORDER — ORPHENADRINE CITRATE 30 MG/ML IJ SOLN
60.0000 mg | Freq: Once | INTRAMUSCULAR | Status: AC
  Administered 2016-09-04: 60 mg via INTRAMUSCULAR
  Filled 2016-09-04: qty 2

## 2016-09-04 MED ORDER — KETOROLAC TROMETHAMINE 30 MG/ML IJ SOLN
30.0000 mg | Freq: Once | INTRAMUSCULAR | Status: AC
  Administered 2016-09-04: 30 mg via INTRAMUSCULAR
  Filled 2016-09-04: qty 1

## 2016-09-04 MED ORDER — MELOXICAM 15 MG PO TABS: 15.0000 mg | ORAL_TABLET | Freq: Every day | ORAL | 0 refills | Status: DC

## 2016-09-04 MED ORDER — OMEPRAZOLE 10 MG PO CPDR: 10.0000 mg | DELAYED_RELEASE_CAPSULE | Freq: Every day | ORAL | 0 refills | Status: DC

## 2016-09-04 MED ORDER — CYCLOBENZAPRINE HCL 10 MG PO TABS: 10.0000 mg | ORAL_TABLET | Freq: Three times a day (TID) | ORAL | 0 refills | Status: DC | PRN

## 2016-09-04 NOTE — ED Provider Notes (Signed)
[facility] Emergency Department Provider Note  ____________________________________________  Time seen: Approximately 5:40 PM  I have reviewed the triage vital signs and the nursing notes.   HISTORY  Chief Complaint Fall    HPI Andrea Hood is a 40 y.o. female who presents emergency department complaining of right elbow pain and lower back pain. Patient states that she was moving today when she accidentally missed a step while moving a couch and fell landing on her lower back and elbow. Patient is reporting sharp pain to her lower back and dull achy pain to her right elbow. Patient is a Tax adviserlaboratory. She denies any radicular symptoms. She denies any saddle anesthesia, paresthesia, loss of bowel or bladder dysfunction. Patient is tried Tylenol and Motrin with minimal relief prior to arrival. No complaints this time. She did not hit her head or lose consciousness.   Past Medical History:  Diagnosis Date  . Migraines   . Pituitary cyst (HCC)   . Seizures [facility](HCC)     Patient Active Problem List   Diagnosis Date Noted  . BACK PAIN 11/19/2010  . INSOMNIA-SLEEP DISORDER-UNSPEC 08/27/2010  . ANXIETY 03/24/2009  . EATING DISORDER, UNSPECIFIED 03/24/2009  . DEPRESSION 03/24/2009  . MIGRAINE HEADACHE 03/24/2009  . GERD 03/24/2009  . GASTRIC ULCER 03/24/2009  . FATIGUE 03/24/2009    Past Surgical History:  Procedure Laterality Date  . ABDOMINAL HYSTERECTOMY    . CHOLECYSTECTOMY      Prior to Admission medications   Medication Sig Start Date End Date Taking? Authorizing Provider  albuterol (PROVENTIL HFA;VENTOLIN HFA) 108 (90 BASE) MCG/ACT inhaler Inhale 2 puffs into the lungs every 6 (six) hours as needed. Shortness of breath and wheezing     Historical Provider, MD  cephALEXin (KEFLEX) 500 MG capsule Take 1 capsule (500 mg total) by mouth 3 (three) times daily. 04/07/15   Tommi Rumpshonda L Summers, PA-C  cholecalciferol (VITAMIN D) 1000 UNITS tablet Take 1,000  Units by mouth daily.      Historical Provider, MD  clonazePAM (KLONOPIN) 1 MG tablet TAKE 1 TABLET BY MOUTH TWICE DAILY 02/07/11   Hannah BeatSpencer Copland, MD  cyclobenzaprine (FLEXERIL) 10 MG tablet Take 1 tablet (10 mg total) by mouth 3 (three) times daily as needed for muscle spasms. 09/04/16   Delorise RoyalsJonathan D Gregary Blackard, PA-C  gabapentin (NEURONTIN) 300 MG capsule Take 300 mg by mouth at bedtime.      Historical Provider, MD  HYDROcodone-acetaminophen (NORCO/VICODIN) 5-325 MG per tablet Take 1 tablet by mouth every 4 (four) hours as needed. 06/29/15   Chinita Pesterari B Triplett, FNP  LevETIRAcetam (KEPPRA PO) Take by mouth.    Historical Provider, MD  meloxicam (MOBIC) 15 MG tablet Take 1 tablet (15 mg total) by mouth daily. 09/04/16   Delorise RoyalsJonathan D Tillie Viverette, PA-C  naproxen (NAPROSYN) 500 MG tablet Take 1 tablet (500 mg total) by mouth 2 (two) times daily with a meal. 08/04/16   Joni Reiningonald K Smith, PA-C  oxyCODONE-acetaminophen (ROXICET) 5-325 MG tablet Take 1 tablet by mouth every 6 (six) hours as needed. 08/04/16 08/04/17  Joni Reiningonald K Smith, PA-C  tiotropium (SPIRIVA) 18 MCG inhalation capsule Place 18 mcg into inhaler and inhale daily.      Historical Provider, MD  vitamin B-12 (CYANOCOBALAMIN) 1000 MCG tablet Take 1,000 mcg by mouth daily.      Historical Provider, MD    Allergies Lamictal [lamotrigine] and Hydrocodone  History reviewed. No pertinent family history.  Social History Social History  Substance Use Topics  . Smoking status:  Former Smoker  . Smokeless tobacco: Never Used  . Alcohol use Yes     Review of Systems  Constitutional: No fever/chills Eyes: No visual changes. No discharge ENT: No upper respiratory complaints. Cardiovascular: no chest pain. Respiratory: no cough. No SOB. Musculoskeletal: Positive for lumbar pain and right elbow pain Skin: Negative for rash, abrasions, lacerations, ecchymosis. Neurological: Negative for headaches, focal weakness or numbness. 10-point ROS otherwise  negative.  ____________________________________________   PHYSICAL EXAM:  VITAL SIGNS: ED Triage Vitals [09/04/16 1642]  Enc Vitals Group     BP 123/79     Pulse Rate 95     Resp 18     Temp 98.2 F (36.8 C)     Temp src      SpO2 97 %     Weight 154 lb (69.9 kg)     Height 5\' 3"  (1.6 m)     Head Circumference      Peak Flow      Pain Score 8     Pain Loc      Pain Edu?      Excl. in GC?      Constitutional: Alert and oriented. Well appearing and in no acute distress. Eyes: Conjunctivae are normal. PERRL. EOMI. Head: Atraumatic. Neck: No stridor.  No cervical spine tenderness to palpation.  Cardiovascular: Normal rate, regular rhythm. Normal S1 and S2.  Good peripheral circulation. Respiratory: Normal respiratory effort without tachypnea or retractions. Lungs CTAB. Good air entry to the bases with no decreased or absent breath sounds. Musculoskeletal: Full range of motion to all extremities. No gross deformities appreciated. Neurologic:  Normal speech and language. No gross focal neurologic deficits are appreciated.  Skin:  Skin is warm, dry and intact. No rash noted. Psychiatric: Mood and affect are normal. Speech and behavior are normal. Patient exhibits appropriate insight and judgement.   ____________________________________________   LABS (all labs ordered are listed, but only abnormal results are displayed)  Labs Reviewed - No data to display ____________________________________________  EKG   ____________________________________________  RADIOLOGY Festus Barren Arhaan Chesnut, personally viewed and evaluated these images (plain radiographs) as part of my medical decision making, as well as reviewing the written report by the radiologist.  Dg Lumbar Spine Complete  Result Date: 09/04/2016 CLINICAL DATA:  Acute lumbar spine and low back pain following fall today. Initial encounter. EXAM: LUMBAR SPINE - COMPLETE 4+ VIEW COMPARISON:  10/15/2010 radiographs  FINDINGS: There is no evidence of lumbar spine fracture. Alignment is normal. Intervertebral disc spaces are maintained. IMPRESSION: Negative. Electronically Signed   By: Harmon Pier M.D.   On: 09/04/2016 18:52   Dg Elbow Complete Right  Result Date: 09/04/2016 CLINICAL DATA:  Acute right elbow pain following fall today. Initial encounter. EXAM: RIGHT ELBOW - COMPLETE 3+ VIEW COMPARISON:  None. FINDINGS: There is no evidence of fracture, dislocation, or joint effusion. There is no evidence of arthropathy or other focal bone abnormality. Soft tissues are unremarkable. IMPRESSION: Negative. Electronically Signed   By: Harmon Pier M.D.   On: 09/04/2016 18:09    ____________________________________________    PROCEDURES  Procedure(s) performed:    Procedures    Medications  ketorolac (TORADOL) 30 MG/ML injection 30 mg (not administered)  orphenadrine (NORFLEX) injection 60 mg (not administered)     ____________________________________________   INITIAL IMPRESSION / ASSESSMENT AND PLAN / ED COURSE  Pertinent labs & imaging results that were available during my care of the patient were reviewed by me and considered in my medical  decision making (see chart for details).  Review of the [place] CSRS was performed in accordance of the [facility] prior to dispensing any controlled drugs.  Clinical Course     Patient's diagnosis is consistent with Fall resulting in contusion of the right elbow x-ray and lumbar paraspinal muscle group. X-rays reveal no acute osseous abnormality to the spine or elbow. Patient is given Toradol muscle relaxer injection of emergency department.. Patient will be discharged home with prescriptions for anti-inflammatory muscle relaxer for symptom control at home. Patient is to follow up with primary care as needed or otherwise directed. Patient is given ED precautions to return to the ED for any worsening or new  symptoms.     ____________________________________________  FINAL CLINICAL IMPRESSION(S) / ED DIAGNOSES  Final diagnoses:  Fall, initial encounter  Contusion of right elbow, initial encounter  Strain of lumbar region, initial encounter      NEW MEDICATIONS STARTED DURING THIS VISIT:  New Prescriptions   CYCLOBENZAPRINE (FLEXERIL) 10 MG TABLET    Take 1 tablet (10 mg total) by mouth 3 (three) times daily as needed for muscle spasms.   MELOXICAM (MOBIC) 15 MG TABLET    Take 1 tablet (15 mg total) by mouth daily.        This chart was dictated using voice recognition software/Dragon. Despite best efforts to proofread, errors can occur which can change the meaning. Any change was purely unintentional.    Racheal PatchesJonathan D Zuzanna Maroney, PA-C 09/04/16 1947    Sharman CheekPhillip Stafford, MD 09/14/16 [phone]

## 2016-09-04 NOTE — ED Notes (Signed)

## 2016-09-04 NOTE — ED Triage Notes (Signed)
Pt arrived to ED with daughter with c/o pain s/p fall down concrete stairs while moving a couch. Pt states she fell on her right arm and right knee and also c/o mid to low back pain. Denies hitting her head and denies any LOC.  Pt has tried ibuprofen and tylenol at 1pm and 12pm respectively.  Pt also tried using heat but no improvement. Ambulatory in triage without difficulty.

## 2016-09-30 ENCOUNTER — Encounter: Payer: Self-pay | Admitting: Emergency Medicine

## 2016-09-30 ENCOUNTER — Emergency Department
Admission: EM | Admit: 2016-09-30 | Discharge: 2016-09-30 | Disposition: A | Discharge status: Home / Self Care | Payer: BLUE CROSS/BLUE SHIELD | Attending: Emergency Medicine | Admitting: Emergency Medicine

## 2016-09-30 DIAGNOSIS — Y939 Activity, unspecified: Secondary | ICD-10-CM | POA: Insufficient documentation

## 2016-09-30 DIAGNOSIS — Y929 Unspecified place or not applicable: Secondary | ICD-10-CM | POA: Insufficient documentation

## 2016-09-30 DIAGNOSIS — W228XXA Striking against or struck by other objects, initial encounter: Secondary | ICD-10-CM | POA: Insufficient documentation

## 2016-09-30 DIAGNOSIS — Y990 Civilian activity done for income or pay: Secondary | ICD-10-CM | POA: Insufficient documentation

## 2016-09-30 DIAGNOSIS — Z87891 Personal history of nicotine dependence: Secondary | ICD-10-CM | POA: Insufficient documentation

## 2016-09-30 DIAGNOSIS — M5410 Radiculopathy, site unspecified: Secondary | ICD-10-CM

## 2016-09-30 DIAGNOSIS — M5416 Radiculopathy, lumbar region: Secondary | ICD-10-CM | POA: Insufficient documentation

## 2016-09-30 MED ORDER — OXYCODONE-ACETAMINOPHEN 7.5-325 MG PO TABS: 1.0000 | ORAL_TABLET | ORAL | 0 refills | Status: DC | PRN

## 2016-09-30 NOTE — ED Notes (Signed)
See triage note  Had a back injury at work on 12/3  Has been going to [facility] for w/c care  Has been on prednisone taper and muscle relaxer's. states pain is worse and moving into left leg   Waiting for ortho appt

## 2016-09-30 NOTE — ED Triage Notes (Signed)
Pt states on Dec 3 she was "putting a 45lb box on a skid when someone got in my way, caused me to shift my weight and I had a sharp pain going down my back". Pt states she has been following up with [facility] who prescribed prednisone and tramadol. Pt states is supposed to be seen at [facility] tomorrow and needs an ortho follow up. Pt is ambulatory with mild limp, states pain is worsening at this time.

## 2016-09-30 NOTE — ED Provider Notes (Signed)
[facility] Emergency Department Provider Note   ____________________________________________   None    (approximate)  I have reviewed the triage vital signs and the nursing notes.   HISTORY  Chief Complaint Back Pain    HPI Andrea Hood is a 40 y.o. female patient complaining of radicular back pain to the left lower extremity. Patient said is secondary to a work-related injury on 09/19/2016.Marland Kitchen. Patient states she is waiting for worker's compensation adjuster or caseworker to schedule her orthopedic appointment. Patient states she's been recommended by urgent care clinic to have an MRI is secondary to radicular component to her back pain. Patient denies any bladder or bowel dysfunction.Patient rates her back pain as a 10 over 10. Patient described "numbness to lower extremity. Patient state complains refractory to muscle relaxers anti-inflammatory and steroids.   Past Medical History:  Diagnosis Date  . Gastric ulcer   . Migraines   . Pituitary cyst (HCC)   . Seizures [facility](HCC)     Patient Active Problem List   Diagnosis Date Noted  . BACK PAIN 11/19/2010  . INSOMNIA-SLEEP DISORDER-UNSPEC 08/27/2010  . ANXIETY 03/24/2009  . EATING DISORDER, UNSPECIFIED 03/24/2009  . DEPRESSION 03/24/2009  . MIGRAINE HEADACHE 03/24/2009  . GERD 03/24/2009  . GASTRIC ULCER 03/24/2009  . FATIGUE 03/24/2009    Past Surgical History:  Procedure Laterality Date  . ABDOMINAL HYSTERECTOMY    . CHOLECYSTECTOMY      Prior to Admission medications   Medication Sig Start Date End Date Taking? Authorizing Provider  albuterol (PROVENTIL HFA;VENTOLIN HFA) 108 (90 BASE) MCG/ACT inhaler Inhale 2 puffs into the lungs every 6 (six) hours as needed. Shortness of breath and wheezing     Historical Provider, MD  cephALEXin (KEFLEX) 500 MG capsule Take 1 capsule (500 mg total) by mouth 3 (three) times daily. 04/07/15   Tommi Rumpshonda L Summers, PA-C  cholecalciferol (VITAMIN D) 1000 UNITS  tablet Take 1,000 Units by mouth daily.      Historical Provider, MD  clonazePAM (KLONOPIN) 1 MG tablet TAKE 1 TABLET BY MOUTH TWICE DAILY 02/07/11   Hannah BeatSpencer Copland, MD  cyclobenzaprine (FLEXERIL) 10 MG tablet Take 1 tablet (10 mg total) by mouth 3 (three) times daily as needed for muscle spasms. 09/04/16   Delorise RoyalsJonathan D Cuthriell, PA-C  gabapentin (NEURONTIN) 300 MG capsule Take 300 mg by mouth at bedtime.      Historical Provider, MD  HYDROcodone-acetaminophen (NORCO/VICODIN) 5-325 MG per tablet Take 1 tablet by mouth every 4 (four) hours as needed. 06/29/15   Chinita Pesterari B Triplett, FNP  LevETIRAcetam (KEPPRA PO) Take by mouth.    Historical Provider, MD  meloxicam (MOBIC) 15 MG tablet Take 1 tablet (15 mg total) by mouth daily. 09/04/16   Delorise RoyalsJonathan D Cuthriell, PA-C  naproxen (NAPROSYN) 500 MG tablet Take 1 tablet (500 mg total) by mouth 2 (two) times daily with a meal. 08/04/16   Joni Reiningonald K Nyjae Hodge, PA-C  omeprazole (PRILOSEC) 10 MG capsule Take 1 capsule (10 mg total) by mouth daily. 09/04/16   Delorise RoyalsJonathan D Cuthriell, PA-C  oxyCODONE-acetaminophen (PERCOCET) 7.5-325 MG tablet Take 1 tablet by mouth every 4 (four) hours as needed for severe pain. 09/30/16   Joni Reiningonald K Kaynen Minner, PA-C  oxyCODONE-acetaminophen (ROXICET) 5-325 MG tablet Take 1 tablet by mouth every 6 (six) hours as needed. 08/04/16 08/04/17  Joni Reiningonald K Zaine Elsass, PA-C  tiotropium (SPIRIVA) 18 MCG inhalation capsule Place 18 mcg into inhaler and inhale daily.      Historical Provider, MD  vitamin  B-12 (CYANOCOBALAMIN) 1000 MCG tablet Take 1,000 mcg by mouth daily.      Historical Provider, MD    Allergies Lamictal [lamotrigine] and Hydrocodone  No family history on file.  Social History Social History  Substance Use Topics  . Smoking status: Former Games developermoker  . Smokeless tobacco: Never Used  . Alcohol use Yes    Review of Systems Constitutional: No fever/chills Eyes: No visual changes. ENT: No sore throat. Cardiovascular: Denies chest  pain. Respiratory: Denies shortness of breath. Gastrointestinal: No abdominal pain.  No nausea, no vomiting.  No diarrhea.  No constipation. Genitourinary: Negative for dysuria. Musculoskeletal: Negative for back pain. Skin: Negative for rash. Neurological: Negative for headaches, focal weakness or numbness. Psychiatric:Anxiety and depression ____________________________________________   PHYSICAL EXAM:  VITAL SIGNS: ED Triage Vitals  Enc Vitals Group     BP 09/30/16 0724 125/71     Pulse Rate 09/30/16 0724 96     Resp 09/30/16 0724 20     Temp 09/30/16 0724 97.7 F (36.5 C)     Temp Source 09/30/16 0724 Oral     SpO2 09/30/16 0724 100 %     Weight 09/30/16 0725 152 lb (68.9 kg)     Height 09/30/16 0725 5\' 5"  (1.651 m)     Head Circumference --      Peak Flow --      Pain Score 09/30/16 0725 10     Pain Loc --      Pain Edu? --      Excl. in GC? --     Constitutional: Alert and oriented. Well appearing and in no acute distress. Eyes: Conjunctivae are normal. PERRL. EOMI. Head: Atraumatic. Nose: No congestion/rhinnorhea. Mouth/Throat: Mucous membranes are moist.  Oropharynx non-erythematous. Neck: No stridor.  No cervical spine tenderness to palpation. Hematological/Lymphatic/Immunilogical: No cervical lymphadenopathy. Cardiovascular: Normal rate, regular rhythm. Grossly normal heart sounds.  Good peripheral circulation. Respiratory: Normal respiratory effort.  No retractions. Lungs CTAB. Gastrointestinal: Soft and nontender. No distention. No abdominal bruits. No CVA tenderness. Musculoskeletal: No lower extremity tenderness nor edema.  No joint effusions. Neurologic:  Normal speech and language. No gross focal neurologic deficits are appreciated. No gait instability. Skin:  Skin is warm, dry and intact. No rash noted. Psychiatric: Mood and affect are normal. Speech and behavior are normal.  ____________________________________________   LABS (all labs ordered are  listed, but only abnormal results are displayed)  Labs Reviewed - No data to display ____________________________________________  EKG   ____________________________________________  RADIOLOGY   ____________________________________________   PROCEDURES  Procedure(s) performed: None  Procedures  Critical Care performed: No  ____________________________________________   INITIAL IMPRESSION / ASSESSMENT AND PLAN / ED COURSE  Pertinent labs & imaging results that were available during my care of the patient were reviewed by me and considered in my medical decision making (see chart for details).  Radicular back pain. Patient given discharge care instructions. Patient advised to contact Worker's Compensation case worker for follow-up care.  Clinical Course      ____________________________________________   FINAL CLINICAL IMPRESSION(S) / ED DIAGNOSES  Final diagnoses:  Acute low back pain with radicular symptoms, duration less than 6 weeks      NEW MEDICATIONS STARTED DURING THIS VISIT:  New Prescriptions   OXYCODONE-ACETAMINOPHEN (PERCOCET) 7.5-325 MG TABLET    Take 1 tablet by mouth every 4 (four) hours as needed for severe pain.     Note:  This document was prepared using Dragon voice recognition software and may include unintentional dictation  errors.    Joni Reining, PA-C 09/30/16 1610    Jeanmarie Plant, MD 10/02/16 [phone]

## 2016-11-26 ENCOUNTER — Encounter: Payer: Self-pay | Admitting: Emergency Medicine

## 2016-11-26 ENCOUNTER — Emergency Department
Admission: EM | Admit: 2016-11-26 | Discharge: 2016-11-26 | Disposition: A | Discharge status: Home / Self Care | Payer: BLUE CROSS/BLUE SHIELD | Attending: Emergency Medicine | Admitting: Emergency Medicine

## 2016-11-26 DIAGNOSIS — Z79899 Other long term (current) drug therapy: Secondary | ICD-10-CM | POA: Insufficient documentation

## 2016-11-26 DIAGNOSIS — Z87891 Personal history of nicotine dependence: Secondary | ICD-10-CM | POA: Insufficient documentation

## 2016-11-26 DIAGNOSIS — K2900 Acute gastritis without bleeding: Secondary | ICD-10-CM | POA: Insufficient documentation

## 2016-11-26 LAB — URINALYSIS, COMPLETE (UACMP) WITH MICROSCOPIC
BACTERIA UA: NONE SEEN
BILIRUBIN URINE: NEGATIVE
Glucose, UA: NEGATIVE mg/dL
HGB URINE DIPSTICK: NEGATIVE
Ketones, ur: NEGATIVE mg/dL
LEUKOCYTES UA: NEGATIVE
Nitrite: NEGATIVE
PROTEIN: NEGATIVE mg/dL
Specific Gravity, Urine: 1.033 — ABNORMAL HIGH (ref 1.005–1.030)
pH: 5 (ref 5.0–8.0)

## 2016-11-26 LAB — COMPREHENSIVE METABOLIC PANEL
ALBUMIN: 4 g/dL (ref 3.5–5.0)
ALT: 16 U/L (ref 14–54)
AST: 17 U/L (ref 15–41)
Alkaline Phosphatase: 57 U/L (ref 38–126)
Anion gap: 7 (ref 5–15)
BUN: 26 mg/dL — AB (ref 6–20)
CHLORIDE: 111 mmol/L (ref 101–111)
CO2: 25 mmol/L (ref 22–32)
Calcium: 9 mg/dL (ref 8.9–10.3)
Creatinine, Ser: 0.73 mg/dL (ref 0.44–1.00)
GFR calc Af Amer: >60 mL/min (ref >60)
GLUCOSE: 96 mg/dL (ref 65–99)
POTASSIUM: 3.5 mmol/L (ref 3.5–5.1)
SODIUM: 143 mmol/L (ref 135–145)
Total Bilirubin: 0.3 mg/dL (ref 0.3–1.2)
Total Protein: 6.9 g/dL (ref 6.5–8.1)

## 2016-11-26 LAB — CBC
HEMATOCRIT: 35.9 % (ref 35.0–47.0)
Hemoglobin: 11.8 g/dL — ABNORMAL LOW (ref 12.0–16.0)
MCH: 28.3 pg (ref 26.0–34.0)
MCHC: 32.9 g/dL (ref 32.0–36.0)
MCV: 85.9 fL (ref 80.0–100.0)
Platelets: 222 10*3/uL (ref 150–440)
RBC: 4.18 MIL/uL (ref 3.80–5.20)
RDW: 14.7 % — ABNORMAL HIGH (ref 11.5–14.5)
WBC: 7.3 10*3/uL (ref 3.6–11.0)

## 2016-11-26 LAB — LIPASE, BLOOD: LIPASE: 48 U/L (ref 11–51)

## 2016-11-26 MED ORDER — ONDANSETRON 4 MG PO TBDP
4.0000 mg | ORAL_TABLET | Freq: Once | ORAL | Status: AC
  Administered 2016-11-26: 4 mg via ORAL

## 2016-11-26 MED ORDER — ALUM & MAG HYDROXIDE-SIMETH 200-200-20 MG/5ML PO SUSP
ORAL | Status: AC
  Filled 2016-11-26: qty 30

## 2016-11-26 MED ORDER — ONDANSETRON HCL 4 MG PO TABS: 4.0000 mg | ORAL_TABLET | Freq: Three times a day (TID) | ORAL | 0 refills | Status: DC | PRN

## 2016-11-26 MED ORDER — PANTOPRAZOLE SODIUM 20 MG PO TBEC: 20.0000 mg | DELAYED_RELEASE_TABLET | Freq: Every day | ORAL | 1 refills | Status: DC

## 2016-11-26 MED ORDER — ALUM & MAG HYDROXIDE-SIMETH 200-200-20 MG/5ML PO SUSP
30.0000 mL | Freq: Once | ORAL | Status: DC
  Filled 2016-11-26: qty 30

## 2016-11-26 MED ORDER — ONDANSETRON 4 MG PO TBDP
ORAL_TABLET | ORAL | Status: AC
  Administered 2016-11-26: 4 mg via ORAL
  Filled 2016-11-26: qty 1

## 2016-11-26 NOTE — ED Provider Notes (Addendum)
Time Seen: Approximately 1618  I have reviewed the triage notes  Chief Complaint: Abdominal Pain   History of Present Illness: Andrea Hood is a 41 y.o. female who presents with acute onset of some lesions on the top of her mouth and underneath her tongue. She states she noticed them this morning and describes them as vesicles. She states they're no longer present at this time though she has developed some epigastric abdominal pain. As any melena or hematochezia, no fever chills .nausea with no persistent vomiting. She states she's tried various antacid medications at home without success for relief. She does have a history of peptic ulcer disease. She denies any lower abdominal or flank pain t   Past Medical History:  Diagnosis Date  . Gastric ulcer   . Migraines   . Pituitary cyst (HCC)   . Seizures [facility])     Patient Active Problem List   Diagnosis Date Noted  . BACK PAIN 11/19/2010  . INSOMNIA-SLEEP DISORDER-UNSPEC 08/27/2010  . ANXIETY 03/24/2009  . EATING DISORDER, UNSPECIFIED 03/24/2009  . DEPRESSION 03/24/2009  . MIGRAINE HEADACHE 03/24/2009  . GERD 03/24/2009  . GASTRIC ULCER 03/24/2009  . FATIGUE 03/24/2009    Past Surgical History:  Procedure Laterality Date  . ABDOMINAL HYSTERECTOMY    . CHOLECYSTECTOMY      Past Surgical History:  Procedure Laterality Date  . ABDOMINAL HYSTERECTOMY    . CHOLECYSTECTOMY      Current Outpatient Rx  . Order #: [id number] Class: Historical Med  . Order #: [id number] Class: Print  . Order #: [id number] Class: Historical Med  . Order #: [id number] Class: Phone In  . Order #: [id number] Class: Print  . Order #: [id number] Class: Historical Med  . Order #: [id number] Class: Print  . Order #: [id number] Class: Historical Med  . Order #: [id number] Class: Print  . Order #: [id number] Class: Print  . Order #: [id number] Class: Print  . Order #: [id number] Class: Print  . Order #: [id number] Class: Print  . Order #: [id number] Class: Print  . Order #:  [id number] Class: Print  . Order #: [id number] Class: Historical Med  . Order #: [id number] Class: Historical Med    Allergies:  Lamictal [lamotrigine]  Family History: History reviewed. No pertinent family history.  Social History: Social History  Substance Use Topics  . Smoking status: Former Games developer  . Smokeless tobacco: Never Used  . Alcohol use Yes     Review of Systems:   10 point review of systems was performed and was otherwise negative:  Constitutional: No fever Eyes: No visual disturbances ENT: No sore throat, ear pain Cardiac: No chest pain Respiratory: No shortness of breath, wheezing, or stridor Abdomen:Patient points toward the epigastric and left upper quadrant area of her abdomen without back or flank discomfort. No diarrhea or constipation, melena or hematochezia Endocrine: No weight loss, No night sweats Extremities: No peripheral edema, cyanosis Skin: No rashes, easy bruising Neurologic: No focal weakness, trouble with speech or swollowing Urologic: No dysuria, Hematuria, or urinary frequency  Physical Exam:  ED Triage Vitals  Enc Vitals Group     BP 11/26/16 1214 117/66     Pulse Rate 11/26/16 1214 96     Resp 11/26/16 1214 16     Temp 11/26/16 1214 98.2 F (36.8 C)     Temp Source 11/26/16 1214 Oral     SpO2 11/26/16 1214 100 %     Weight 11/26/16 1211 152 lb (68.9 kg)     Height 11/26/16 1211 5\' 5"  (1.651 m)     Head  Circumference --      Peak Flow --      Pain Score 11/26/16 1211 8     Pain Loc --      Pain Edu? --      Excl. in GC? --     General: Awake , Alert , and Oriented times 3; GCS 15 Head: Normal cephalic , atraumatic Eyes: Pupils equal , round, reactive to light Nose/Throat: No nasal drainage, patent upper airway without erythema or exudate. No obvious lesions noted cavity Neck: Supple, Full range of motion, No anterior adenopathy or palpable thyroid masses Lungs: Clear to ascultation without wheezes , rhonchi, or rales Heart:  Regular rate, regular rhythm without murmurs , gallops , or rubs Abdomen: Tender exclusively in the epigastric area without rebound guarding rigidity. No focal tenderness over McBurney's point, negative Murphy's sign. .        Extremities: 2 plus symmetric pulses. No edema, clubbing or cyanosis Neurologic: normal ambulation, Motor symmetric without deficits, sensory intact Skin: warm, dry, no rashes   Labs:   All laboratory work was reviewed including any pertinent negatives or positives listed below:  Labs Reviewed  COMPREHENSIVE METABOLIC PANEL - Abnormal; Notable for the following:       Result Value   BUN 26 (*)    All other components within normal limits  CBC - Abnormal; Notable for the following:    Hemoglobin 11.8 (*)    RDW 14.7 (*)    All other components within normal limits  URINALYSIS, COMPLETE (UACMP) WITH MICROSCOPIC - Abnormal; Notable for the following:    Color, Urine YELLOW (*)    APPearance HAZY (*)    Specific Gravity, Urine 1.033 (*)    Squamous Epithelial / LPF 0-5 (*)    All other components within normal limits  LIPASE, BLOOD  Laboratory work was reviewed and showed no clinically significant abnormalities.  EKG: ED ECG REPORT I, Jennye MoccasinBrian S Namiko Pritts, the attending physician, personally viewed and interpreted this ECG.  Date: 11/26/2016 EKG Time: 1217Rate: 93 Rhythm: normal sinus rhythm QRS Axis: normal Intervals: normal ST/T Wave abnormalities: normal Conduction Disturbances: none Narrative Interpretation: unremarkable normal EKG ED Course: * Patient's stay here was uneventful and she received Zofran for nausea with improvement. She was resting comfortably when I went back in to check on the patient I felt this was unlikely to be a surgical abdomen. Her hemoglobin is stable and she doesn't appear to have significant pain for concern for a ulcer with erosion through the bowel wall, etc.     Assessment:  Gastritis   Final Clinical Impression:    Final diagnoses:  Acute gastritis without hemorrhage, unspecified gastritis type     Plan:  Outpatient " New Prescriptions   ONDANSETRON (ZOFRAN) 4 MG TABLET    Take 1 tablet (4 mg total) by mouth every 8 (eight) hours as needed for nausea or vomiting.   PANTOPRAZOLE (PROTONIX) 20 MG TABLET    Take 1 tablet (20 mg total) by mouth daily.  " Patient was advised to return immediately if condition worsens. Patient was advised to follow up with their primary care physician or other specialized physicians involved in their outpatient care. The patient and/or family member/power of attorney had laboratory results reviewed at the bedside. All questions and concerns were addressed and appropriate discharge instructions were distributed by the nursing staff.            Jennye MoccasinBrian S Cherrish Vitali, MD 11/26/16 1944    Jennye MoccasinBrian S Kabao Leite, MD  11/26/16 1954  

## 2016-11-26 NOTE — ED Triage Notes (Addendum)
Pt c/o rash to roof of mouth X 3 days. Also c/o pain to LUQ. Has had nausea but no vomiting. Reports pain as severe. No distress noted. Skin warm and pink. No known fevers.  Normal stools. No rash visualized by RN at this time.

## 2016-11-26 NOTE — Discharge Instructions (Signed)
Please return to emergency department especially if he develops blood in your stool usually dark and tarry in appearance, fever, persistent uncontrolled vomiting, or any other new concerns  Please return immediately if condition worsens. Please contact her primary physician or the physician you were given for referral. If you have any specialist physicians involved in her treatment and plan please also contact them. Thank you for using [facility].

## 2017-03-17 ENCOUNTER — Emergency Department
Admission: EM | Admit: 2017-03-17 | Discharge: 2017-03-17 | Disposition: A | Discharge status: Home / Self Care | Payer: Self-pay | Attending: Emergency Medicine | Admitting: Emergency Medicine

## 2017-03-17 ENCOUNTER — Emergency Department: Payer: Self-pay

## 2017-03-17 DIAGNOSIS — Y939 Activity, unspecified: Secondary | ICD-10-CM | POA: Insufficient documentation

## 2017-03-17 DIAGNOSIS — W108XXA Fall (on) (from) other stairs and steps, initial encounter: Secondary | ICD-10-CM | POA: Insufficient documentation

## 2017-03-17 DIAGNOSIS — Y929 Unspecified place or not applicable: Secondary | ICD-10-CM | POA: Insufficient documentation

## 2017-03-17 DIAGNOSIS — S322XXA Fracture of coccyx, initial encounter for closed fracture: Secondary | ICD-10-CM | POA: Insufficient documentation

## 2017-03-17 DIAGNOSIS — M25522 Pain in left elbow: Secondary | ICD-10-CM | POA: Insufficient documentation

## 2017-03-17 DIAGNOSIS — Y999 Unspecified external cause status: Secondary | ICD-10-CM | POA: Insufficient documentation

## 2017-03-17 DIAGNOSIS — F17200 Nicotine dependence, unspecified, uncomplicated: Secondary | ICD-10-CM | POA: Insufficient documentation

## 2017-03-17 DIAGNOSIS — S3210XA Unspecified fracture of sacrum, initial encounter for closed fracture: Secondary | ICD-10-CM | POA: Insufficient documentation

## 2017-03-17 MED ORDER — HYDROCODONE-ACETAMINOPHEN 5-325 MG PO TABS: 1.0000 | ORAL_TABLET | ORAL | 0 refills | Status: DC | PRN

## 2017-03-17 MED ORDER — ACETAMINOPHEN 325 MG PO TABS
650.0000 mg | ORAL_TABLET | Freq: Once | ORAL | Status: AC
  Administered 2017-03-17: 650 mg via ORAL
  Filled 2017-03-17: qty 2

## 2017-03-17 NOTE — ED Notes (Signed)
Pt discharged home after verbalizing understanding of discharge instructions; nad noted. 

## 2017-03-17 NOTE — ED Notes (Signed)
Pt refusing pregnancy test at this time.  States she has had a hysterectomy.  Xray notified.

## 2017-03-17 NOTE — ED Triage Notes (Signed)
Pt states that she fell down the stairs at her apartment.  Pt states that she fell down approximately 6 steps.  Pt denies LOC and denies hitting her head.  Pt states that her hips, buttocks, back, head, and R elbow are hurting at this time.  Pt is A&Ox4 at this time.

## 2017-03-17 NOTE — ED Provider Notes (Signed)
Andrea [facility] Emergency Department Provider Note  ____________________________________________  Time seen: Approximately 3:28 PM  I have reviewed the triage vital signs and the nursing notes.   HISTORY  Chief Complaint Fall    HPI Andrea Hood is a 41 y.o. female who presents to emergency department with low back pain, hip pain, left elbow pain after falling down about 5 steps. Pain begins in her lower back and radiates down the back of both legs. She did not hit her head or lose consciousness. She has been walking since fall but with this pain. No alleviating measures have been attempted. She denies headache, visual changes, shortness of breath, chest pain, nausea, vomiting, abdominal pain.   Past Medical History:  Diagnosis Date  . Gastric ulcer   . Migraines   . Pituitary cyst (HCC)   . Seizures [facility])     Patient Active Problem List   Diagnosis Date Noted  . BACK PAIN 11/19/2010  . INSOMNIA-SLEEP DISORDER-UNSPEC 08/27/2010  . ANXIETY 03/24/2009  . EATING DISORDER, UNSPECIFIED 03/24/2009  . DEPRESSION 03/24/2009  . MIGRAINE HEADACHE 03/24/2009  . GERD 03/24/2009  . GASTRIC ULCER 03/24/2009  . FATIGUE 03/24/2009    Past Surgical History:  Procedure Laterality Date  . ABDOMINAL HYSTERECTOMY    . CHOLECYSTECTOMY      Prior to Admission medications   Medication Sig Start Date End Date Taking? Authorizing Provider  albuterol (PROVENTIL HFA;VENTOLIN HFA) 108 (90 BASE) MCG/ACT inhaler Inhale 2 puffs into the lungs every 6 (six) hours as needed. Shortness of breath and wheezing     [provider]  cephALEXin (KEFLEX) 500 MG capsule Take 1 capsule (500 mg total) by mouth 3 (three) times daily. 04/07/15   Tommi Rumps, PA-C  cholecalciferol (VITAMIN D) 1000 UNITS tablet Take 1,000 Units by mouth daily.      [provider]  clonazePAM (KLONOPIN) 1 MG tablet TAKE 1 TABLET BY MOUTH TWICE DAILY 02/07/11   Copland, Karleen Hampshire, MD   cyclobenzaprine (FLEXERIL) 10 MG tablet Take 1 tablet (10 mg total) by mouth 3 (three) times daily as needed for muscle spasms. 09/04/16   Cuthriell, Delorise Royals, PA-C  gabapentin (NEURONTIN) 300 MG capsule Take 300 mg by mouth at bedtime.      [provider]  HYDROcodone-acetaminophen (NORCO) 5-325 MG tablet Take 1 tablet by mouth every 4 (four) hours as needed for moderate pain. 03/17/17   [name], PA-C  LevETIRAcetam (KEPPRA PO) Take by mouth.    [provider]  meloxicam (MOBIC) 15 MG tablet Take 1 tablet (15 mg total) by mouth daily. 09/04/16   Cuthriell, Delorise Royals, PA-C  naproxen (NAPROSYN) 500 MG tablet Take 1 tablet (500 mg total) by mouth 2 (two) times daily with a meal. 08/04/16   Joni Reining, PA-C  omeprazole (PRILOSEC) 10 MG capsule Take 1 capsule (10 mg total) by mouth daily. 09/04/16   Cuthriell, Delorise Royals, PA-C  ondansetron (ZOFRAN) 4 MG tablet Take 1 tablet (4 mg total) by mouth every 8 (eight) hours as needed for nausea or vomiting. 11/26/16   Jennye Moccasin, MD  oxyCODONE-acetaminophen (PERCOCET) 7.5-325 MG tablet Take 1 tablet by mouth every 4 (four) hours as needed for severe pain. 09/30/16   Joni Reining, PA-C  oxyCODONE-acetaminophen (ROXICET) 5-325 MG tablet Take 1 tablet by mouth every 6 (six) hours as needed. 08/04/16 08/04/17  Joni Reining, PA-C  pantoprazole (PROTONIX) 20 MG tablet Take 1 tablet (20 mg total) by mouth daily.  11/26/16 11/26/17  Jennye Moccasin, MD  tiotropium (SPIRIVA) 18 MCG inhalation capsule Place 18 mcg into inhaler and inhale daily.      [provider]  vitamin B-12 (CYANOCOBALAMIN) 1000 MCG tablet Take 1,000 mcg by mouth daily.      [provider]    Allergies Lamictal [lamotrigine]  No family history on file.  Social History Social History  Substance Use Topics  . Smoking status: Current Every Day Smoker  . Smokeless tobacco: Never Used  . Alcohol use Yes     Review of Systems   Constitutional: No fever/chills Cardiovascular: No chest pain. Respiratory:  No SOB. Gastrointestinal: No abdominal pain.  No nausea, no vomiting.  Musculoskeletal: Positive for low back pain, hip pain, right elbow pain. Skin: Negative for rash, abrasions, lacerations, ecchymosis. Neurological: Negative for headaches   ____________________________________________   PHYSICAL EXAM:  VITAL SIGNS: ED Triage Vitals  Enc Vitals Group     BP 03/17/17 1513 114/79     Pulse Rate 03/17/17 1513 (!) 112     Resp 03/17/17 1513 20     Temp 03/17/17 1513 98.2 F (36.8 C)     Temp Source 03/17/17 1513 Oral     SpO2 03/17/17 1513 98 %     Weight 03/17/17 1514 152 lb (68.9 kg)     Height 03/17/17 1514 5\' 3"  (1.6 m)     Head Circumference --      Peak Flow --      Pain Score 03/17/17 1513 9     Pain Loc --      Pain Edu? --      Excl. in GC? --      Constitutional: Alert and oriented. Well appearing and in no acute distress. Eyes: Conjunctivae are normal. PERRL. EOMI. Head: Atraumatic. ENT:      Ears:      Nose: No congestion/rhinnorhea.      Mouth/Throat: Mucous membranes are moist.  Neck: No stridor.  Cardiovascular: Normal rate, regular rhythm.  Good peripheral circulation. 2+ radial pulses. Respiratory: Normal respiratory effort without tachypnea or retractions. Lungs CTAB. Good air entry to the bases with no decreased or absent breath sounds. Gastrointestinal: Bowel sounds 4 quadrants. Soft and nontender to palpation. No guarding or rigidity. No palpable masses. No distention.  Musculoskeletal: Full range of motion to all extremities. No gross deformities appreciated. Tenderness to palpation over lumbar spine. Tenderness to palpation over right olecranon process.  Neurologic:  Normal speech and language. No gross focal neurologic deficits are appreciated.  Skin:  Skin is warm, dry and intact. No rash noted.   ____________________________________________   LABS (all labs  ordered are listed, but only abnormal results are displayed)  Labs Reviewed - No data to display ____________________________________________  EKG   ____________________________________________  RADIOLOGY Lexine Baton, personally viewed and evaluated these images (plain radiographs) as part of my medical decision making, as well as reviewing the written report by the radiologist.  No results found.  ____________________________________________    PROCEDURES  Procedure(s) performed:    Procedures    Medications  acetaminophen (TYLENOL) tablet 650 mg (650 mg Oral Given 03/17/17 1549)     ____________________________________________   INITIAL IMPRESSION / ASSESSMENT AND PLAN / ED COURSE  Pertinent labs & imaging results that were available during my care of the patient were reviewed by me and considered in my medical decision making (see chart for details).  Review of the [place] CSRS was performed in accordance of the [facility]  prior to dispensing any controlled drugs.  Patient's diagnosis is consistent with coccyx injury. Vital signs and exam are reassuring. Coccyx xray could not exclude fracture per radiology. Patient has point tenderness in this spot. Right elbow, lumbar, hip x-rays negative for acute bony abnormalities. Patient was receiving narcotic pain medications from her primary care for a previous work injury but is no longer. Two days of pain medication will be given.  Patient is to follow up with PCP as directed. Patient is given ED precautions to return to the ED for any worsening or new symptoms.   ____________________________________________  FINAL CLINICAL IMPRESSION(S) / ED DIAGNOSES  Final diagnoses:  Closed fracture of sacrum and coccyx, initial encounter [facility](HCC)      NEW MEDICATIONS STARTED DURING THIS VISIT:  Discharge Medication List as of 03/17/2017  5:46 PM          This chart was dictated using voice recognition software/Dragon. Despite  best efforts to proofread, errors can occur which can change the meaning. Any change was purely unintentional.    Enid DerryWagner, Neco Kling, PA-C 03/18/17 1926    [name], Phillip, MD 03/19/17 1155

## 2017-03-22 ENCOUNTER — Emergency Department
Admission: EM | Admit: 2017-03-22 | Discharge: 2017-03-22 | Disposition: A | Discharge status: Home / Self Care | Payer: BLUE CROSS/BLUE SHIELD | Attending: Emergency Medicine | Admitting: Emergency Medicine

## 2017-03-22 ENCOUNTER — Encounter: Payer: Self-pay | Admitting: Emergency Medicine

## 2017-03-22 DIAGNOSIS — W19XXXD Unspecified fall, subsequent encounter: Secondary | ICD-10-CM | POA: Insufficient documentation

## 2017-03-22 DIAGNOSIS — F17200 Nicotine dependence, unspecified, uncomplicated: Secondary | ICD-10-CM | POA: Insufficient documentation

## 2017-03-22 DIAGNOSIS — S300XXD Contusion of lower back and pelvis, subsequent encounter: Secondary | ICD-10-CM | POA: Insufficient documentation

## 2017-03-22 DIAGNOSIS — S20229A Contusion of unspecified back wall of thorax, initial encounter: Secondary | ICD-10-CM

## 2017-03-22 MED ORDER — METHOCARBAMOL 500 MG PO TABS: 500.0000 mg | ORAL_TABLET | Freq: Three times a day (TID) | ORAL | 0 refills | Status: DC | PRN

## 2017-03-22 NOTE — ED Provider Notes (Signed)
[facility] Emergency Department Provider Note ____________________________________________  Time seen: 10:59 AM  I have reviewed the triage vital signs and the nursing notes.  HISTORY  Chief Complaint  Back Pain   HPI Andrea Hood is a 41 y.o. female is here complaining of low back pain after falling last week. She was seen in the emergency department and evaluated. X-rays were done and she was told that possibly she had a fractured coccyx. She states that she is unable see the orthopedist due to her not having insurance. She states that the pain medication she was getting up while in the emergency Department "is not helping". She rates her pain as a 9/10.    Past Medical History:  Diagnosis Date  . Gastric ulcer   . Migraines   . Pituitary cyst (HCC)   . Seizures [facility])     Patient Active Problem List   Diagnosis Date Noted  . BACK PAIN 11/19/2010  . INSOMNIA-SLEEP DISORDER-UNSPEC 08/27/2010  . ANXIETY 03/24/2009  . EATING DISORDER, UNSPECIFIED 03/24/2009  . DEPRESSION 03/24/2009  . MIGRAINE HEADACHE 03/24/2009  . GERD 03/24/2009  . GASTRIC ULCER 03/24/2009  . FATIGUE 03/24/2009    Past Surgical History:  Procedure Laterality Date  . ABDOMINAL HYSTERECTOMY    . CHOLECYSTECTOMY      Prior to Admission medications   Medication Sig Start Date End Date Taking? Authorizing Provider  albuterol (PROVENTIL HFA;VENTOLIN HFA) 108 (90 BASE) MCG/ACT inhaler Inhale 2 puffs into the lungs every 6 (six) hours as needed. Shortness of breath and wheezing     [provider]  cholecalciferol (VITAMIN D) 1000 UNITS tablet Take 1,000 Units by mouth daily.      [provider]  clonazePAM (KLONOPIN) 1 MG tablet TAKE 1 TABLET BY MOUTH TWICE DAILY 02/07/11   Copland, Karleen Hampshire, MD  gabapentin (NEURONTIN) 300 MG capsule Take 300 mg by mouth at bedtime.      [provider]  HYDROcodone-acetaminophen (NORCO) 5-325 MG tablet Take 1 tablet by  mouth every 4 (four) hours as needed for moderate pain. 03/17/17   [name], PA-C  LevETIRAcetam (KEPPRA PO) Take by mouth.    [provider]  methocarbamol (ROBAXIN) 500 MG tablet Take 1 tablet (500 mg total) by mouth every 8 (eight) hours as needed for muscle spasms. 03/22/17   Tommi Rumps, PA-C  tiotropium (SPIRIVA) 18 MCG inhalation capsule Place 18 mcg into inhaler and inhale daily.      [provider]  vitamin B-12 (CYANOCOBALAMIN) 1000 MCG tablet Take 1,000 mcg by mouth daily.      [provider]    Allergies Lamictal [lamotrigine]  No family history on file.  Social History Social History  Substance Use Topics  . Smoking status: Current Every Day Smoker  . Smokeless tobacco: Never Used  . Alcohol use Yes    Review of Systems  Constitutional: Negative for fever. Cardiovascular: Negative for chest pain. Respiratory: Negative for shortness of breath. Musculoskeletal: Positive for low back pain. Skin: Negative for rash. Neurological: Negative for headaches, focal weakness or numbness. ____________________________________________  PHYSICAL EXAM:  VITAL SIGNS: ED Triage Vitals  Enc Vitals Group     BP 03/22/17 1007 134/83     Pulse Rate 03/22/17 1008 (!) 114     Resp 03/22/17 1007 20     Temp 03/22/17 1007 98.1 F (36.7 C)     Temp Source 03/22/17 1007 Oral     SpO2 03/22/17 1007 98 %  Weight 03/22/17 1007 157 lb (71.2 kg)     Height 03/22/17 1007 5\' 4"  (1.626 m)     Head Circumference --      Peak Flow --      Pain Score 03/22/17 1018 9     Pain Loc --      Pain Edu? --      Excl. in GC? --     Constitutional: Alert and oriented. Well appearing and in no distress. Head: Normocephalic and atraumatic. Eyes: Conjunctivae are normal.  Neck: No stridor. Cardiovascular: Normal rate, regular rhythm. Normal distal pulses. Respiratory: Normal respiratory effort. No wheezes/rales/rhonchi. Musculoskeletal: Examination of the  back there is no gross deformity noted. There is some mild tenderness on palpation of the lumbar spine. Range of motion is minimally restricted and normal gait was noted. Neurologic:  Normal gait.  Normal speech and language. No gross focal neurologic deficits are appreciated. Skin:  Skin is warm, dry and intact. No ecchymosis, erythema or abrasions were noted. Psychiatric: Mood and affect are normal. Patient exhibits appropriate insight and judgment.    INITIAL IMPRESSION / ASSESSMENT AND PLAN / ED COURSE  Patient stated to triage that she was not able to see her orthopedist due to not having any insurance however DEA website shows that patient was seen or at least given a prescription by [facility] on 03/18/17 for acetaminophen with codeine No. 3 #60 tablets. Patient was given a prescription for Robaxin 1 every 8 hours if needed for muscle spasms #12 no refill. Patient is to continue following up with [facility] if any continued problems. She is encouraged to continue using ice to her back as needed for discomfort.    ____________________________________________  FINAL CLINICAL IMPRESSION(S) / ED DIAGNOSES  Final diagnoses:  Contusion of back, unspecified laterality, initial encounter     [name], Jasenia Weilbacher L, PA-C 03/22/17 1558    Jeanmarie PlantMcShane, James A, MD 03/22/17 1600

## 2017-03-22 NOTE — Discharge Instructions (Signed)
Continue regular medication. Follow-up with [facility] if any continued problems.  Continue Ice. Robaxin as needed for muscle spasms.  Do not take this medication and drive.  It may be that the only time you could take this is at bedtime.

## 2017-03-22 NOTE — ED Notes (Addendum)
See triage note  conts to have lower back pain s/p fall last week . Denies new injury but stats pain is moving slightly and feels like area is swollen  Ambulates well

## 2017-03-22 NOTE — ED Triage Notes (Signed)
Pt presents with low back/tail bone pain after falling last week. Pt states ortho will not see her due not having any insurance. Pt states the pain meds we gave are not helping.

## 2017-06-01 ENCOUNTER — Emergency Department
Admission: EM | Admit: 2017-06-01 | Discharge: 2017-06-01 | Disposition: A | Discharge status: Home / Self Care | Payer: Commercial Managed Care - HMO | Attending: Emergency Medicine | Admitting: Emergency Medicine

## 2017-06-01 ENCOUNTER — Emergency Department: Payer: Commercial Managed Care - HMO

## 2017-06-01 ENCOUNTER — Encounter: Payer: Self-pay | Admitting: Emergency Medicine

## 2017-06-01 DIAGNOSIS — Z79899 Other long term (current) drug therapy: Secondary | ICD-10-CM | POA: Diagnosis not present

## 2017-06-01 DIAGNOSIS — R112 Nausea with vomiting, unspecified: Secondary | ICD-10-CM | POA: Insufficient documentation

## 2017-06-01 DIAGNOSIS — J45909 Unspecified asthma, uncomplicated: Secondary | ICD-10-CM | POA: Insufficient documentation

## 2017-06-01 DIAGNOSIS — F17210 Nicotine dependence, cigarettes, uncomplicated: Secondary | ICD-10-CM | POA: Insufficient documentation

## 2017-06-01 DIAGNOSIS — R109 Unspecified abdominal pain: Secondary | ICD-10-CM | POA: Diagnosis present

## 2017-06-01 DIAGNOSIS — R197 Diarrhea, unspecified: Secondary | ICD-10-CM | POA: Diagnosis not present

## 2017-06-01 HISTORY — DX: Unspecified asthma, uncomplicated: J45.909

## 2017-06-01 LAB — COMPREHENSIVE METABOLIC PANEL
ALK PHOS: 73 U/L (ref 38–126)
ALT: 18 U/L (ref 14–54)
AST: 22 U/L (ref 15–41)
Albumin: 4.5 g/dL (ref 3.5–5.0)
Anion gap: 10 (ref 5–15)
BUN: 17 mg/dL (ref 6–20)
CALCIUM: 9.5 mg/dL (ref 8.9–10.3)
CO2: 22 mmol/L (ref 22–32)
Chloride: 107 mmol/L (ref 101–111)
Creatinine, Ser: 0.74 mg/dL (ref 0.44–1.00)
GFR calc non Af Amer: >60 mL/min (ref >60)
Glucose, Bld: 88 mg/dL (ref 65–99)
Potassium: 3.4 mmol/L — ABNORMAL LOW (ref 3.5–5.1)
SODIUM: 139 mmol/L (ref 135–145)
Total Bilirubin: 0.3 mg/dL (ref 0.3–1.2)
Total Protein: 7.9 g/dL (ref 6.5–8.1)

## 2017-06-01 LAB — CBC
HCT: 38.7 % (ref 35.0–47.0)
Hemoglobin: 13.2 g/dL (ref 12.0–16.0)
MCH: 28.6 pg (ref 26.0–34.0)
MCHC: 34.1 g/dL (ref 32.0–36.0)
MCV: 83.9 fL (ref 80.0–100.0)
PLATELETS: 261 10*3/uL (ref 150–440)
RBC: 4.61 MIL/uL (ref 3.80–5.20)
RDW: 13.2 % (ref 11.5–14.5)
WBC: 8.2 10*3/uL (ref 3.6–11.0)

## 2017-06-01 LAB — URINALYSIS, COMPLETE (UACMP) WITH MICROSCOPIC
BACTERIA UA: NONE SEEN
BILIRUBIN URINE: NEGATIVE
Glucose, UA: NEGATIVE mg/dL
HGB URINE DIPSTICK: NEGATIVE
KETONES UR: 5 mg/dL — AB
Leukocytes, UA: NEGATIVE
Nitrite: NEGATIVE
PROTEIN: NEGATIVE mg/dL
Specific Gravity, Urine: 1.029 (ref 1.005–1.030)
pH: 5 (ref 5.0–8.0)

## 2017-06-01 LAB — LIPASE, BLOOD: Lipase: 28 U/L (ref 11–51)

## 2017-06-01 MED ORDER — ONDANSETRON 4 MG PO TBDP: ORAL_TABLET | ORAL | 0 refills | Status: DC

## 2017-06-01 MED ORDER — SODIUM CHLORIDE 0.9 % IV BOLUS (SEPSIS)
1000.0000 mL | INTRAVENOUS | Status: AC
  Administered 2017-06-01: 1000 mL via INTRAVENOUS

## 2017-06-01 MED ORDER — MORPHINE SULFATE (PF) 4 MG/ML IV SOLN
4.0000 mg | Freq: Once | INTRAVENOUS | Status: AC
  Administered 2017-06-01: 4 mg via INTRAVENOUS
  Filled 2017-06-01: qty 1

## 2017-06-01 MED ORDER — IOPAMIDOL (ISOVUE-300) INJECTION 61%
30.0000 mL | Freq: Once | INTRAVENOUS | Status: AC | PRN
  Administered 2017-06-01: 30 mL via ORAL

## 2017-06-01 MED ORDER — IOPAMIDOL (ISOVUE-300) INJECTION 61%
100.0000 mL | Freq: Once | INTRAVENOUS | Status: AC | PRN
  Administered 2017-06-01: 100 mL via INTRAVENOUS

## 2017-06-01 MED ORDER — CIPROFLOXACIN HCL 500 MG PO TABS: 500.0000 mg | ORAL_TABLET | Freq: Two times a day (BID) | ORAL | 0 refills | Status: AC

## 2017-06-01 MED ORDER — HYDROCODONE-ACETAMINOPHEN 5-325 MG PO TABS
ORAL_TABLET | ORAL | Status: AC
  Filled 2017-06-01: qty 2

## 2017-06-01 MED ORDER — HYDROCODONE-ACETAMINOPHEN 5-325 MG PO TABS
2.0000 | ORAL_TABLET | Freq: Once | ORAL | Status: AC
  Administered 2017-06-01: 2 via ORAL

## 2017-06-01 MED ORDER — ONDANSETRON 4 MG PO TBDP
4.0000 mg | ORAL_TABLET | Freq: Once | ORAL | Status: AC | PRN
  Administered 2017-06-01: 4 mg via ORAL
  Filled 2017-06-01: qty 1

## 2017-06-01 MED ORDER — HYDROCODONE-ACETAMINOPHEN 5-325 MG PO TABS: 1.0000 | ORAL_TABLET | Freq: Four times a day (QID) | ORAL | 0 refills | Status: DC | PRN

## 2017-06-01 MED ORDER — ONDANSETRON HCL 4 MG/2ML IJ SOLN
4.0000 mg | INTRAMUSCULAR | Status: AC
  Administered 2017-06-01: 4 mg via INTRAVENOUS
  Filled 2017-06-01: qty 2

## 2017-06-01 NOTE — ED Triage Notes (Signed)
Pt in via POV with complaints of sudden onset abdominal pain w/ N/V in the middle of the night, pt reports 3 days of diarrhea, states she has been unable to keep anything down today.  Pt afebrile upon arrival, does report chills, hot flashes over the last few days.  Vitals WDL, NAD noted at this time.

## 2017-06-01 NOTE — ED Provider Notes (Signed)
[facility] Emergency Department Provider Note  ____________________________________________   First MD Initiated Contact with Patient 06/01/17 1535     (approximate)  I have reviewed the triage vital signs and the nursing notes.   HISTORY  Chief Complaint Abdominal Pain    HPI Andrea Hood is a 41 y.o. female with medical history as listed below who presents for evaluation of nausea, vomiting, diarrhea, and lower right-sided abdominal pain.  She states that the symptoms are severe.   Nothing in particular makes the patient's symptoms better nor worse.  They started about 4 days ago with diarrhea and she says she has as many 15 bowel movements a day that are watery but without any evidence of blood.  About 3 days ago she started having nausea and persistent vomiting as well and she feels like she has not been able to eat or drink much of anything without vomiting it back up or having a rush to the bathroom and had diarrhea.  She has had at least one episode of nocturnal bowel incontinence where she just could not get to the bathroom in time.  She states this is very unusual for her.  The pain is dull and aching and occasionally cramping, comes and goes, is severe at its worst a mile at its best, and located primarily in the right side of her abdomen but also radiates throughout the lower abdomen in general.  She is used to upper abdominal pain due to a chronic gastric ulcer but this feels different.  She has had her gallbladder out but has not had appendectomy.  She denies fever/chills, chest pain, shortness of breath, dysuria.  She has not been traveling recently, has not had any unusual food, has not had any sick contacts, has not had any fresh water exposures that she feels would put her at risk for developing an infection.  She has not been on antibiotics recently.   Past Medical History:  Diagnosis Date  . Asthma   . Gastric ulcer   . Migraines   .  Pituitary cyst (HCC)   . Seizures [facility])     Patient Active Problem List   Diagnosis Date Noted  . BACK PAIN 11/19/2010  . INSOMNIA-SLEEP DISORDER-UNSPEC 08/27/2010  . ANXIETY 03/24/2009  . EATING DISORDER, UNSPECIFIED 03/24/2009  . DEPRESSION 03/24/2009  . MIGRAINE HEADACHE 03/24/2009  . GERD 03/24/2009  . GASTRIC ULCER 03/24/2009  . FATIGUE 03/24/2009    Past Surgical History:  Procedure Laterality Date  . ABDOMINAL HYSTERECTOMY    . CHOLECYSTECTOMY      Prior to Admission medications   Medication Sig Start Date End Date Taking? Authorizing Provider  cholecalciferol (VITAMIN D) 1000 UNITS tablet Take 1,000 Units by mouth daily.     Yes [provider]  gabapentin (NEURONTIN) 300 MG capsule Take 300 mg by mouth at bedtime.     Yes [provider]  levETIRAcetam (KEPPRA) 1000 MG tablet Take 1,000 mg by mouth 2 (two) times daily.    Yes [provider]  vitamin B-12 (CYANOCOBALAMIN) 1000 MCG tablet Take 1,000 mcg by mouth daily.     Yes [provider]  albuterol (PROVENTIL HFA;VENTOLIN HFA) 108 (90 BASE) MCG/ACT inhaler Inhale 2 puffs into the lungs every 6 (six) hours as needed. Shortness of breath and wheezing     [provider]  ciprofloxacin (CIPRO) 500 MG tablet Take 1 tablet (500 mg total) by mouth 2 (two) times daily. 06/01/17 06/06/17  Loleta Rose,  MD  clonazePAM (KLONOPIN) 1 MG tablet TAKE 1 TABLET BY MOUTH TWICE DAILY Patient taking differently: 1.5 mg tid 02/07/11   Copland, Karleen Hampshire, MD  HYDROcodone-acetaminophen (NORCO/VICODIN) 5-325 MG tablet Take 1-2 tablets by mouth every 6 (six) hours as needed for moderate pain. 06/01/17   Loleta Rose, MD  methocarbamol (ROBAXIN) 500 MG tablet Take 1 tablet (500 mg total) by mouth every 8 (eight) hours as needed for muscle spasms. Patient not taking: Reported on 06/01/2017 03/22/17   Tommi Rumps, PA-C  ondansetron (ZOFRAN ODT) 4 MG disintegrating tablet Allow 1-2 tablets to dissolve  in your mouth every 8 hours as needed for nausea/vomiting 06/01/17   Loleta Rose, MD    Allergies Depakote [divalproex sodium] and Lamictal [lamotrigine]  No family history on file.  Social History Social History  Substance Use Topics  . Smoking status: Current Every Day Smoker    Packs/day: 0.50    Types: Cigarettes  . Smokeless tobacco: Never Used  . Alcohol use Yes    Review of Systems Constitutional: No fever/chills Eyes: No visual changes. ENT: No sore throat. Cardiovascular: Denies chest pain. Respiratory: Denies shortness of breath. Gastrointestinal: 4 days of persistent and copious diarrhea, 3 days of persistent nausea and vomiting as well as waxing and waning lower abdominal pain worse on the right Genitourinary: Negative for dysuria. Musculoskeletal: Negative for neck pain.  Negative for back pain. Integumentary: Negative for rash. Neurological: Negative for headaches, focal weakness or numbness.   ____________________________________________   PHYSICAL EXAM:  VITAL SIGNS: ED Triage Vitals [06/01/17 1423]  Enc Vitals Group     BP 115/79     Pulse Rate 97     Resp 20     Temp 97.9 F (36.6 C)     Temp Source Oral     SpO2 98 %     Weight 68 kg (150 lb)     Height 1.626 m (5\' 4" )     Head Circumference      Peak Flow      Pain Score 9     Pain Loc      Pain Edu?      Excl. in GC?     Constitutional: Alert and oriented. Well appearing and in no acute distress. Eyes: Conjunctivae are normal.  Head: Atraumatic. Nose: No congestion/rhinnorhea. Mouth/Throat: Mucous membranes are moist. Neck: No stridor.  No meningeal signs.   Cardiovascular: Normal rate, regular rhythm. Good peripheral circulation. Grossly normal heart sounds. Respiratory: Normal respiratory effort.  No retractions. Lungs CTAB. Gastrointestinal: Soft and Nondistended.  She has moderate tenderness to palpation throughout the right side of her abdomen with some referred pain when  palpating on the left but no peritonitis and no guarding.  No rebound. Musculoskeletal: No lower extremity tenderness nor edema. No gross deformities of extremities. Neurologic:  Normal speech and language. No gross focal neurologic deficits are appreciated.  Skin:  Skin is warm, dry and intact. No rash noted. Psychiatric: Mood and affect are normal. Speech and behavior are normal.  ____________________________________________   LABS (all labs ordered are listed, but only abnormal results are displayed)  Labs Reviewed  COMPREHENSIVE METABOLIC PANEL - Abnormal; Notable for the following:       Result Value   Potassium 3.4 (*)    All other components within normal limits  URINALYSIS, COMPLETE (UACMP) WITH MICROSCOPIC - Abnormal; Notable for the following:    Color, Urine YELLOW (*)    APPearance HAZY (*)    Ketones, ur 5 (*)  Squamous Epithelial / LPF 6-30 (*)    All other components within normal limits  LIPASE, BLOOD  CBC   ____________________________________________  EKG  None - EKG not ordered by ED physician ____________________________________________  RADIOLOGY   Ct Abdomen Pelvis W Contrast  Result Date: 06/01/2017 CLINICAL DATA:  Abdominal pain, nausea, vomiting EXAM: CT ABDOMEN AND PELVIS WITH CONTRAST TECHNIQUE: Multidetector CT imaging of the abdomen and pelvis was performed using the standard protocol following bolus administration of intravenous contrast. CONTRAST:  ISOVUE-300 IOPAMIDOL (ISOVUE-300) INJECTION 61% COMPARISON:  None. FINDINGS: Lower chest: No acute abnormality. Hepatobiliary: No focal liver abnormality is seen. Coarse calcification in the right posterior hepatic lobe likely dystrophic. Status post cholecystectomy. No biliary dilatation. Pancreas: Unremarkable. No pancreatic ductal dilatation or surrounding inflammatory changes. Spleen: Normal in size without focal abnormality. Adrenals/Urinary Tract: Adrenal glands are unremarkable. Kidneys are  normal, without renal calculi, focal lesion, or hydronephrosis. Bladder is unremarkable. Stomach/Bowel: Stomach is within normal limits. Appendix appears normal. No evidence of bowel wall thickening, distention, or inflammatory changes. Air-fluid levels throughout the colon as can be seen with diarrhea. Vascular/Lymphatic: No significant vascular findings are present. No enlarged abdominal or pelvic lymph nodes. Reproductive: Status post hysterectomy. No adnexal masses. Other: No abdominal wall hernia or abnormality. No abdominopelvic ascites. Musculoskeletal: No acute osseous abnormality. No lytic or sclerotic osseous lesion. IMPRESSION: 1. No acute abdominal or pelvic pathology. 2. Air-fluid levels throughout the colon as can be seen with diarrhea. Electronically Signed   By: Elige Ko   On: 06/01/2017 16:45    ____________________________________________   PROCEDURES  Critical Care performed: No   Procedure(s) performed:   Procedures   ____________________________________________   INITIAL IMPRESSION / ASSESSMENT AND PLAN / ED COURSE  Pertinent labs & imaging results that were available during my care of the patient were reviewed by me and considered in my medical decision making (see chart for details).  I had a lengthy discussion with the patient about the cause of her symptoms.  Viral gastroenteritis is most likely be given the amount of pain and tenderness she is having in her right lower quadrant I will evaluate with a CT scan to rule out other acute causes of abdominal pain including but not limited to diverticulitis and appendicitis.  Her symptoms are almost exclusively GI in nature including copious diarrhea and vomiting, but in spite of the duration of her symptoms and the severity of them her vital signs are normal and all of her labs are within normal limits which is reassuring.  I will provide morphine, Zofran, 1 L of IV fluids, and obtain a CT scan of her abdomen and pelvis  with oral and IV contrast and then reassess.  She has already expressed that she does not want to stay in the hospital and I encouraged her that we should be able to manage her symptoms as an outpatient.   Clinical Course as of Jun 01 1846  Wed Jun 01, 2017  1741 I reviewed the patient's prescription history over the last 12 months in the multi-state controlled substances database(s) that includes [place], [place], [place], [place], [place], [place], [place], [place], [place], [place], [place], [place], [place], and [place].  Results were notable for regular prescriptions for clonazepam and frequent prescriptions for Tylenol 3, last of which was prescribed over 2 months ago.  She has also had intermittent prescriptions for hydrocodone although again this is not been for several months.  I will give her a short course  of Norco as described previously but encourage close outpatient follow-up.  [CF]    Clinical Course User Index [CF] Loleta RoseForbach, Laveta Gilkey, MD    ____________________________________________  FINAL CLINICAL IMPRESSION(S) / ED DIAGNOSES  Final diagnoses:  Nausea vomiting and diarrhea     MEDICATIONS GIVEN DURING THIS VISIT:  Medications  ondansetron (ZOFRAN-ODT) disintegrating tablet 4 mg (4 mg Oral Given 06/01/17 1431)  morphine 4 MG/ML injection 4 mg (4 mg Intravenous Given 06/01/17 1612)  ondansetron (ZOFRAN) injection 4 mg (4 mg Intravenous Given 06/01/17 1610)  sodium chloride 0.9 % bolus 1,000 mL (0 mLs Intravenous Stopped 06/01/17 1752)  iopamidol (ISOVUE-300) 61 % injection 30 mL (30 mLs Oral Contrast Given 06/01/17 1559)  iopamidol (ISOVUE-300) 61 % injection 100 mL (100 mLs Intravenous Contrast Given 06/01/17 1626)  HYDROcodone-acetaminophen (NORCO/VICODIN) 5-325 MG per tablet 2 tablet (2 tablets Oral Given 06/01/17 1757)     NEW OUTPATIENT MEDICATIONS STARTED DURING THIS VISIT:  Discharge Medication List as of 06/01/2017  5:43 PM      START taking these medications   Details  ciprofloxacin (CIPRO) 500 MG tablet Take 1 tablet (500 mg total) by mouth 2 (two) times daily., Starting Wed 06/01/2017, Until Mon 06/06/2017, Print    ondansetron (ZOFRAN ODT) 4 MG disintegrating tablet Allow 1-2 tablets to dissolve in your mouth every 8 hours as needed for nausea/vomiting, Print        Discharge Medication List as of 06/01/2017  5:43 PM    CONTINUE these medications which have CHANGED   Details  HYDROcodone-acetaminophen (NORCO/VICODIN) 5-325 MG tablet Take 1-2 tablets by mouth every 6 (six) hours as needed for moderate pain., Starting Wed 06/01/2017, Print        Discharge Medication List as of 06/01/2017  5:43 PM       Note:  This document was prepared using Dragon voice recognition software and may include unintentional dictation errors.    Loleta RoseForbach, Arren Laminack, MD 06/01/17 [phone]

## 2017-06-01 NOTE — Discharge Instructions (Signed)
We believe your symptoms are caused by either a viral infection or possible a bad food exposure.  Either way, since your symptoms have improved, we feel it is safe for you to go home and follow up with your regular doctor.  Please read the included information and stick to a bland diet for the next two days.  Drink plenty of clear fluids, and if you were provided with a prescription, please take it according to the label instructions.    If you develop any new or worsening symptoms, including persistent vomiting not controlled with medication, fever greater than 101, severe or worsening abdominal pain, or other symptoms that concern you, please return immediately to the Emergency Department.  Take Norco as prescribed for severe pain. Do not drink alcohol, drive or participate in any other potentially dangerous activities while taking this medication as it may make you sleepy. Do not take this medication with any other sedating medications, either prescription or over-the-counter. If you were prescribed Percocet or Vicodin, do not take these with acetaminophen (Tylenol) as it is already contained within these medications.   This medication is an opiate (or narcotic) pain medication and can be habit forming.  Use it as little as possible to achieve adequate pain control.  Do not use or use it with extreme caution if you have a history of opiate abuse or dependence.  If you are on a pain contract with your primary care doctor or a pain specialist, be sure to let them know you were prescribed this medication today from the [facility].  This medication is intended for your use only - do not give any to anyone else and keep it in a secure place where nobody else, especially children, have access to it.  It will also cause or worsen constipation, so you may want to consider taking an over-the-counter stool softener while you are taking this medication.

## 2017-11-23 ENCOUNTER — Encounter: Payer: Self-pay | Admitting: Emergency Medicine

## 2017-11-23 ENCOUNTER — Emergency Department
Admission: EM | Admit: 2017-11-23 | Discharge: 2017-11-23 | Disposition: A | Discharge status: Home / Self Care | Payer: Commercial Managed Care - HMO | Attending: Emergency Medicine | Admitting: Emergency Medicine

## 2017-11-23 DIAGNOSIS — Z79899 Other long term (current) drug therapy: Secondary | ICD-10-CM | POA: Diagnosis not present

## 2017-11-23 DIAGNOSIS — J45909 Unspecified asthma, uncomplicated: Secondary | ICD-10-CM | POA: Diagnosis not present

## 2017-11-23 DIAGNOSIS — J111 Influenza due to unidentified influenza virus with other respiratory manifestations: Secondary | ICD-10-CM | POA: Insufficient documentation

## 2017-11-23 DIAGNOSIS — F17210 Nicotine dependence, cigarettes, uncomplicated: Secondary | ICD-10-CM | POA: Diagnosis not present

## 2017-11-23 DIAGNOSIS — R69 Illness, unspecified: Secondary | ICD-10-CM

## 2017-11-23 DIAGNOSIS — R509 Fever, unspecified: Secondary | ICD-10-CM | POA: Diagnosis present

## 2017-11-23 LAB — INFLUENZA PANEL BY PCR (TYPE A & B)
Influenza A By PCR: NEGATIVE
Influenza B By PCR: NEGATIVE

## 2017-11-23 MED ORDER — ONDANSETRON HCL 8 MG PO TABS: 8.0000 mg | ORAL_TABLET | Freq: Three times a day (TID) | ORAL | 0 refills | Status: DC | PRN

## 2017-11-23 MED ORDER — BENZONATATE 100 MG PO CAPS
200.0000 mg | ORAL_CAPSULE | Freq: Once | ORAL | Status: AC
  Administered 2017-11-23: 200 mg via ORAL
  Filled 2017-11-23: qty 2

## 2017-11-23 MED ORDER — PSEUDOEPH-BROMPHEN-DM 30-2-10 MG/5ML PO SYRP: 5.0000 mL | ORAL_SOLUTION | Freq: Four times a day (QID) | ORAL | 0 refills | Status: DC | PRN

## 2017-11-23 MED ORDER — ONDANSETRON 8 MG PO TBDP
8.0000 mg | ORAL_TABLET | Freq: Once | ORAL | Status: AC
Start: 2017-11-23 — End: 2017-11-23
  Administered 2017-11-23: 8 mg via ORAL
  Filled 2017-11-23: qty 1

## 2017-11-23 MED ORDER — IBUPROFEN 600 MG PO TABS: 600.0000 mg | ORAL_TABLET | Freq: Three times a day (TID) | ORAL | 0 refills | Status: DC | PRN

## 2017-11-23 MED ORDER — KETOROLAC TROMETHAMINE 60 MG/2ML IM SOLN
60.0000 mg | Freq: Once | INTRAMUSCULAR | Status: AC
  Administered 2017-11-23: 60 mg via INTRAMUSCULAR
  Filled 2017-11-23: qty 2

## 2017-11-23 NOTE — ED Notes (Addendum)
Pt states started with HA on Monday, then began having cough, vomiting, diarrhea, dry heaving, generalized body aches, and increasing fatigue. Pt states her boss last week was sick. Pt is alert and oriented at this time. Pt states at home has been using Nyquil, dayquil, Ibuprofen, tylenol, theraflu without relief. Pt states last episode of vomiting was today at approx 1300, and last diarrhea was approx 1130 today.

## 2017-11-23 NOTE — ED Provider Notes (Signed)
[facility] Emergency Department Provider Note   ____________________________________________   First MD Initiated Contact with Patient 11/23/17 1459     (approximate)  I have reviewed the triage vital signs and the nursing notes.   HISTORY  Chief Complaint Influenza    HPI Andrea Hood is a 42 y.o. female patient complained of 3 days of headache, fever/chills, body aches, sore throat, cough, nausea and vomiting.  Patient denies diarrhea.  Patient states no relief with over-the-counter medications consist of NyQuil and TheraFlu.  Patient state people at her work area also been sick.  Patient has not taken a flu shot for this season.  Patient rates the pain discomfort is 8.  Patient described the pain is "generalized aching".  Past Medical History:  Diagnosis Date  . Asthma   . Gastric ulcer   . Migraines   . Pituitary cyst (HCC)   . Seizures [facility](HCC)     Patient Active Problem List   Diagnosis Date Noted  . BACK PAIN 11/19/2010  . INSOMNIA-SLEEP DISORDER-UNSPEC 08/27/2010  . ANXIETY 03/24/2009  . EATING DISORDER, UNSPECIFIED 03/24/2009  . DEPRESSION 03/24/2009  . MIGRAINE HEADACHE 03/24/2009  . GERD 03/24/2009  . GASTRIC ULCER 03/24/2009  . FATIGUE 03/24/2009    Past Surgical History:  Procedure Laterality Date  . ABDOMINAL HYSTERECTOMY    . CHOLECYSTECTOMY      Prior to Admission medications   Medication Sig Start Date End Date Taking? Authorizing Provider  albuterol (PROVENTIL HFA;VENTOLIN HFA) 108 (90 BASE) MCG/ACT inhaler Inhale 2 puffs into the lungs every 6 (six) hours as needed. Shortness of breath and wheezing     [provider]  brompheniramine-pseudoephedrine-DM 30-2-10 MG/5ML syrup Take 5 mLs by mouth 4 (four) times daily as needed. 11/23/17   [name], Burel Kahre K, PA-C  cholecalciferol (VITAMIN D) 1000 UNITS tablet Take 1,000 Units by mouth daily.      [provider]  clonazePAM (KLONOPIN) 1 MG tablet TAKE 1  TABLET BY MOUTH TWICE DAILY Patient taking differently: 1.5 mg tid 02/07/11   Copland, Karleen HampshireSpencer, MD  gabapentin (NEURONTIN) 300 MG capsule Take 300 mg by mouth at bedtime.      [provider]  HYDROcodone-acetaminophen (NORCO/VICODIN) 5-325 MG tablet Take 1-2 tablets by mouth every 6 (six) hours as needed for moderate pain. 06/01/17   Loleta RoseForbach, [name], MD  ibuprofen (ADVIL,MOTRIN) 600 MG tablet Take 1 tablet (600 mg total) by mouth every 8 (eight) hours as needed. 11/23/17   [name], Dudley Mages K, PA-C  levETIRAcetam (KEPPRA) 1000 MG tablet Take 1,000 mg by mouth 2 (two) times daily.     [provider]  methocarbamol (ROBAXIN) 500 MG tablet Take 1 tablet (500 mg total) by mouth every 8 (eight) hours as needed for muscle spasms. Patient not taking: Reported on 06/01/2017 03/22/17   [name], [name], PA-C  ondansetron (ZOFRAN ODT) 4 MG disintegrating tablet Allow 1-2 tablets to dissolve in your mouth every 8 hours as needed for nausea/vomiting 06/01/17   Loleta RoseForbach, [name], MD  ondansetron (ZOFRAN) 8 MG tablet Take 1 tablet (8 mg total) by mouth every 8 (eight) hours as needed for nausea or vomiting. 11/23/17   [name], Myalee Stengel K, PA-C  vitamin B-12 (CYANOCOBALAMIN) 1000 MCG tablet Take 1,000 mcg by mouth daily.      [provider]    Allergies Depakote [divalproex sodium] and Lamictal [lamotrigine]  No family history on file.  Social History Social History   Tobacco Use  . Smoking status: Current  Every Day Smoker    Packs/day: 0.50    Types: Cigarettes  . Smokeless tobacco: Never Used  Substance Use Topics  . Alcohol use: Yes  . Drug use: No    Review of Systems Constitutional: Fever/chills and body aches. Eyes: No visual changes. ENT: Sore throat and nasal congestion.  Cardiovascular: Denies chest pain. Respiratory: Denies shortness of breath.  Nonproductive cough. Gastrointestinal: No abdominal pain.   nausea, no vomiting.  No diarrhea.  No constipation. Genitourinary:  Negative for dysuria. Musculoskeletal: Negative for back pain. Skin: Negative for rash. Neurological: Negative for headaches, focal weakness or numbness. Allergic/Immunilogical: See medication list ____________________________________________   PHYSICAL EXAM:  VITAL SIGNS: ED Triage Vitals [11/23/17 1408]  Enc Vitals Group     BP 105/73     Pulse Rate 87     Resp 18     Temp 98.5 F (36.9 C)     Temp Source Oral     SpO2 98 %     Weight 170 lb (77.1 kg)     Height 5\' 4"  (1.626 m)     Head Circumference      Peak Flow      Pain Score 8     Pain Loc      Pain Edu?      Excl. in GC?    Constitutional: Alert and oriented. Well appearing and in no acute distress. Nose: Edematous nasal turbinates clear rhinorrhea . mouth/Throat: Mucous membranes are moist.  Oropharynx erythematous.  Postnasal drainage. Neck: No stridor. Hematological/Lymphatic/Immunilogical: No cervical lymphadenopathy. Cardiovascular: Normal rate, regular rhythm. Grossly normal heart sounds.  Good peripheral circulation. Respiratory: Normal respiratory effort.  No retractions. Lungs CTAB. Gastrointestinal: Soft and nontender. No distention. No abdominal bruits. No CVA tenderness. Neurologic:  Normal speech and language. No gross focal neurologic deficits are appreciated. No gait instability. Skin:  Skin is warm, dry and intact. No rash noted. Psychiatric: Mood and affect are normal. Speech and behavior are normal.  ____________________________________________   LABS (all labs ordered are listed, but only abnormal results are displayed)  Labs Reviewed  INFLUENZA PANEL BY PCR (TYPE A & B)   ____________________________________________  EKG   ____________________________________________  RADIOLOGY  ED MD interpretation:    Official radiology report(s): No results found.  ____________________________________________   PROCEDURES  Procedure(s) performed: None  Procedures  Critical Care  performed: No  ____________________________________________   INITIAL IMPRESSION / ASSESSMENT AND PLAN / ED COURSE  As part of my medical decision making, I reviewed the following data within the electronic MEDICAL RECORD NUMBER    Viral respiratory illness.  Discussed negative flu results with patient.  Patient given discharge care instruction advised take medication as directed.  Patient advised to follow-up with [facility] if condition persists.      ____________________________________________   FINAL CLINICAL IMPRESSION(S) / ED DIAGNOSES  Final diagnoses:  Influenza-like illness     ED Discharge Orders        Ordered    brompheniramine-pseudoephedrine-DM 30-2-10 MG/5ML syrup  4 times daily PRN     11/23/17 1618    ibuprofen (ADVIL,MOTRIN) 600 MG tablet  Every 8 hours PRN     11/23/17 1618    ondansetron (ZOFRAN) 8 MG tablet  Every 8 hours PRN     11/23/17 1618       Note:  This document was prepared using Dragon voice recognition software and may include unintentional dictation errors.    Joni Reining, PA-C 11/23/17 1621  Don Perking, [place], MD 11/24/17 [phone]

## 2017-11-23 NOTE — ED Triage Notes (Signed)
Pt comes into the ED via PV c/o flu like symptoms with headaches, fevers at home, body aches, sore throat, cough, and nausea.  Patient states this has been going on since Monday.  Patient has been using OTC medication Theraflu, Nyquil, ibuprofen, and tylenol with no relief.  Patient has been around others who have been sick.  Patient has even and unlabored respirations at this time and in NAD with warm and dry skin.

## 2018-05-05 ENCOUNTER — Encounter: Payer: Self-pay | Admitting: Emergency Medicine

## 2018-05-05 ENCOUNTER — Emergency Department
Admission: EM | Admit: 2018-05-05 | Discharge: 2018-05-05 | Disposition: A | Discharge status: Home / Self Care | Payer: 59 | Attending: Emergency Medicine | Admitting: Emergency Medicine

## 2018-05-05 ENCOUNTER — Other Ambulatory Visit: Payer: Self-pay

## 2018-05-05 DIAGNOSIS — Z79899 Other long term (current) drug therapy: Secondary | ICD-10-CM | POA: Insufficient documentation

## 2018-05-05 DIAGNOSIS — J45909 Unspecified asthma, uncomplicated: Secondary | ICD-10-CM | POA: Diagnosis not present

## 2018-05-05 DIAGNOSIS — R200 Anesthesia of skin: Secondary | ICD-10-CM | POA: Insufficient documentation

## 2018-05-05 DIAGNOSIS — F17210 Nicotine dependence, cigarettes, uncomplicated: Secondary | ICD-10-CM | POA: Diagnosis not present

## 2018-05-05 DIAGNOSIS — M545 Low back pain: Secondary | ICD-10-CM | POA: Insufficient documentation

## 2018-05-05 DIAGNOSIS — M549 Dorsalgia, unspecified: Secondary | ICD-10-CM

## 2018-05-05 LAB — BASIC METABOLIC PANEL
Anion gap: 7 (ref 5–15)
BUN: 14 mg/dL (ref 6–20)
CALCIUM: 9.5 mg/dL (ref 8.9–10.3)
CO2: 28 mmol/L (ref 22–32)
Chloride: 106 mmol/L (ref 98–111)
Creatinine, Ser: 0.61 mg/dL (ref 0.44–1.00)
Glucose, Bld: 101 mg/dL — ABNORMAL HIGH (ref 70–99)
POTASSIUM: 3.7 mmol/L (ref 3.5–5.1)
SODIUM: 141 mmol/L (ref 135–145)

## 2018-05-05 LAB — CBC
HCT: 35.9 % (ref 35.0–47.0)
Hemoglobin: 12.2 g/dL (ref 12.0–16.0)
MCH: 29.9 pg (ref 26.0–34.0)
MCHC: 34.1 g/dL (ref 32.0–36.0)
MCV: 87.6 fL (ref 80.0–100.0)
PLATELETS: 231 10*3/uL (ref 150–440)
RBC: 4.1 MIL/uL (ref 3.80–5.20)
RDW: 14.1 % (ref 11.5–14.5)
WBC: 5.4 10*3/uL (ref 3.6–11.0)

## 2018-05-05 LAB — URINALYSIS, COMPLETE (UACMP) WITH MICROSCOPIC
BACTERIA UA: NONE SEEN
BILIRUBIN URINE: NEGATIVE
Glucose, UA: NEGATIVE mg/dL
HGB URINE DIPSTICK: NEGATIVE
KETONES UR: NEGATIVE mg/dL
LEUKOCYTES UA: NEGATIVE
Nitrite: NEGATIVE
PROTEIN: NEGATIVE mg/dL
SPECIFIC GRAVITY, URINE: 1.021 (ref 1.005–1.030)
pH: 5 (ref 5.0–8.0)

## 2018-05-05 MED ORDER — KETOROLAC TROMETHAMINE 60 MG/2ML IM SOLN
60.0000 mg | Freq: Once | INTRAMUSCULAR | Status: AC
  Administered 2018-05-05: 60 mg via INTRAMUSCULAR

## 2018-05-05 MED ORDER — MORPHINE SULFATE (PF) 4 MG/ML IV SOLN
4.0000 mg | Freq: Once | INTRAVENOUS | Status: AC
  Administered 2018-05-05: 4 mg via INTRAMUSCULAR
  Filled 2018-05-05: qty 1

## 2018-05-05 MED ORDER — KETOROLAC TROMETHAMINE 60 MG/2ML IM SOLN
INTRAMUSCULAR | Status: AC
  Administered 2018-05-05: 60 mg via INTRAMUSCULAR
  Filled 2018-05-05: qty 2

## 2018-05-05 NOTE — ED Notes (Addendum)
Patient is sitting up in stretcher, appears comfortable at this time with no signs of distress present. Equal, unlabored rise and fall of chest noted within normal rate. Playing with handheld game system. Will continue to monitor. Awaiting MRI

## 2018-05-05 NOTE — ED Notes (Addendum)
Spoke with MD regarding pt pain level and wait time for MRI, wait time MRI also provided to pt. Pt unhappy with the provider not ordering anymore narcotics. Awaiting orders pending MD and pt discussion of medications

## 2018-05-05 NOTE — ED Provider Notes (Addendum)
[facility] Emergency Department Provider Note  Time seen: 5:45 PM  I have reviewed the triage vital signs and the nursing notes.   HISTORY  Chief Complaint No chief complaint on file.    HPI Andrea Hood is a 42 y.o. female with a past medical history of migraine disorder, anxiety, presents to the emergency department for lower back pain.  According to the patient for the past 2 to 3 days she has been experiencing severe lower back pain radiating down both legs which she describes as a burning sensation.  Today she states she is having difficulty urinating as well.  States she had back pain many years ago but it went away after several weeks and has not had any issues with back pain since.  Denies any chest pain shortness of breath, fever, largely negative review of systems otherwise.  States she feels like both of her lower extremities are intermittently numb as well.   Past Medical History:  Diagnosis Date  . Asthma   . Gastric ulcer   . Migraines   . Pituitary cyst (HCC)   . Seizures [facility](HCC)     Patient Active Problem List   Diagnosis Date Noted  . BACK PAIN 11/19/2010  . INSOMNIA-SLEEP DISORDER-UNSPEC 08/27/2010  . ANXIETY 03/24/2009  . EATING DISORDER, UNSPECIFIED 03/24/2009  . DEPRESSION 03/24/2009  . MIGRAINE HEADACHE 03/24/2009  . GERD 03/24/2009  . GASTRIC ULCER 03/24/2009  . FATIGUE 03/24/2009    Past Surgical History:  Procedure Laterality Date  . ABDOMINAL HYSTERECTOMY    . CHOLECYSTECTOMY      Prior to Admission medications   Medication Sig Start Date End Date Taking? Authorizing Provider  albuterol (PROVENTIL HFA;VENTOLIN HFA) 108 (90 BASE) MCG/ACT inhaler Inhale 2 puffs into the lungs every 6 (six) hours as needed. Shortness of breath and wheezing     [provider]  brompheniramine-pseudoephedrine-DM 30-2-10 MG/5ML syrup Take 5 mLs by mouth 4 (four) times daily as needed. 11/23/17   [name], [name], PA-C   cholecalciferol (VITAMIN D) 1000 UNITS tablet Take 1,000 Units by mouth daily.      [provider]  clonazePAM (KLONOPIN) 1 MG tablet TAKE 1 TABLET BY MOUTH TWICE DAILY Patient taking differently: 1.5 mg tid 02/07/11   Copland, Karleen HampshireSpencer, MD  gabapentin (NEURONTIN) 300 MG capsule Take 300 mg by mouth at bedtime.      [provider]  HYDROcodone-acetaminophen (NORCO/VICODIN) 5-325 MG tablet Take 1-2 tablets by mouth every 6 (six) hours as needed for moderate pain. 06/01/17   Loleta RoseForbach, [name], MD  ibuprofen (ADVIL,MOTRIN) 600 MG tablet Take 1 tablet (600 mg total) by mouth every 8 (eight) hours as needed. 11/23/17   [name], [name], PA-C  levETIRAcetam (KEPPRA) 1000 MG tablet Take 1,000 mg by mouth 2 (two) times daily.     [provider]  methocarbamol (ROBAXIN) 500 MG tablet Take 1 tablet (500 mg total) by mouth every 8 (eight) hours as needed for muscle spasms. Patient not taking: Reported on 06/01/2017 03/22/17   [name], [name], PA-C  ondansetron (ZOFRAN ODT) 4 MG disintegrating tablet Allow 1-2 tablets to dissolve in your mouth every 8 hours as needed for nausea/vomiting 06/01/17   Loleta RoseForbach, [name], MD  ondansetron (ZOFRAN) 8 MG tablet Take 1 tablet (8 mg total) by mouth every 8 (eight) hours as needed for nausea or vomiting. 11/23/17   [name], [name], PA-C  vitamin B-12 (CYANOCOBALAMIN) 1000 MCG tablet Take 1,000 mcg by mouth daily.  [provider]    Allergies  Allergen Reactions  . Depakote [Divalproex Sodium] Anaphylaxis  . Lamictal [Lamotrigine] Anaphylaxis    No family history on file.  Social History Social History   Tobacco Use  . Smoking status: Current Every Day Smoker    Packs/day: 0.50    Types: Cigarettes  . Smokeless tobacco: Never Used  Substance Use Topics  . Alcohol use: Yes  . Drug use: No    Review of Systems Constitutional: Negative for fever. Cardiovascular: Negative for chest pain. Respiratory: Negative for shortness of  breath. Gastrointestinal: Negative for abdominal pain Genitourinary: States difficulty urinating today.  Feels like she has to actively push out her urine. Musculoskeletal: States intermittent leg swelling bilaterally.  Severe lower back pain radiation down both legs. Skin: Negative for skin complaints  Neurological: Negative for headache.  States intermittent numbness to both legs. All other ROS negative  ____________________________________________   PHYSICAL EXAM:  VITAL SIGNS: ED Triage Vitals  Enc Vitals Group     BP 05/05/18 1534 (!) 135/91     Pulse Rate 05/05/18 1534 91     Resp 05/05/18 1534 (!) 91     Temp 05/05/18 1534 97.9 F (36.6 C)     Temp Source 05/05/18 1534 Oral     SpO2 05/05/18 1534 99 %     Weight 05/05/18 1535 175 lb (79.4 kg)     Height 05/05/18 1535 5\' 4"  (1.626 m)     Head Circumference --      Peak Flow --      Pain Score 05/05/18 1534 8     Pain Loc --      Pain Edu? --      Excl. in GC? --    Constitutional: Alert and oriented. Well appearing and in no distress. Eyes: Normal exam ENT   Head: Normocephalic and atraumatic.   Mouth/Throat: Mucous membranes are moist. Cardiovascular: Normal rate, regular rhythm. No murmur Respiratory: Normal respiratory effort without tachypnea nor retractions. Breath sounds are clear Gastrointestinal: Soft and nontender. No distention.  Musculoskeletal: Nontender with normal range of motion in all extremities.  Neurologic:  Normal speech and language.  Decreased strength in bilateral lower extremities.  Able to lift legs off the bed but states significant back pain.  Has minimal effort when pushing against my hands. Skin:  Skin is warm, dry and intact.  Psychiatric: Mood and affect are normal.     INITIAL IMPRESSION / ASSESSMENT AND PLAN / ED COURSE  Pertinent labs & imaging results that were available during my care of the patient were reviewed by me and considered in my medical decision making (see  chart for details).  Patient presents to the emergency department for lower extremity pain, lower back pain, with burning in her lower extremity is intermittent weakness now with difficulty urinating.  Differential would include cauda equina syndrome, sciatica, peripheral neuropathy.  Patient's labs are normal.  However given the patient's symptoms we will proceed with an MRI to further evaluate.  We will dose a one-time dose of pain medication while awaiting MRI results.  We will also obtain a postvoid bladder scan.  I reviewed the patient's narcotic database she has had multiple narcotic prescriptions filled over the past 2 months.  In the past 6 weeks she has had 11 controlled substances filled.  ----------------------------------------- 9:03 PM on 05/05/2018 ----------------------------------------- Patient appears very well currently.  She is moving around the bed, putting her legs on the outside the bed, standing up  on occasion.  Does not appear overly weak.  Patient states she no longer wishes to wait for her MRI and is asking to be discharged home.  Patient is asking for cardiology referral given her lower extremity swelling although not appreciated on exam.  We will discharge the patient with PCP and cardiology follow-up.   EKG reviewed and interpreted by myself shows normal sinus rhythm 84 bpm with a narrow QRS, normal axis, normal intervals, no concerning ST changes.   ____________________________________________   FINAL CLINICAL IMPRESSION(S) / ED DIAGNOSES  Low back pain Sciatica    Minna Antis, MD 05/05/18 2103    Minna Antis, MD 05/12/18 1406

## 2018-05-05 NOTE — ED Triage Notes (Signed)
Low back pain x 1 week with burning pain radiating down backs of legs.  Activity makes pain worse.  Today c/o difficulty urinating and lower extremity swelling.

## 2018-05-05 NOTE — ED Notes (Signed)
Pt. ambulated to bathroom with no difficulty.  Pt. Had post bladder scan with a result of zero ml.

## 2018-05-05 NOTE — ED Notes (Addendum)
Pt states that she cannot wait for MRI and [facility]. I apologized numerous times for the wait times. MD aware, and after thorough discussion regarding risks of leaving AMA and pt assuming all those risks by choosing to leave, pt continues to insist it is in her best interest to do so. Instructions provided for pt with referral to cardiology and PCP, AMA form signed by pt. Pt. Verbalizes understanding of d/c instructions and follow-up. VS stable.  Pt. In NAD at time of d/c and denies further concerns regarding this visit. Pt. Stable at the time of departure from the unit, departing unit by the safest and most appropriate manner per that pt condition and limitations with all belongings accounted for. Pt advised to return to the ED at any time for emergent concerns, or for new/worsening symptoms.

## 2018-07-22 ENCOUNTER — Emergency Department
Admission: EM | Admit: 2018-07-22 | Discharge: 2018-07-22 | Disposition: A | Discharge status: Home / Self Care | Payer: 59 | Attending: Emergency Medicine | Admitting: Emergency Medicine

## 2018-07-22 ENCOUNTER — Emergency Department: Payer: 59

## 2018-07-22 ENCOUNTER — Other Ambulatory Visit: Payer: Self-pay

## 2018-07-22 DIAGNOSIS — J45909 Unspecified asthma, uncomplicated: Secondary | ICD-10-CM | POA: Insufficient documentation

## 2018-07-22 DIAGNOSIS — R112 Nausea with vomiting, unspecified: Secondary | ICD-10-CM | POA: Insufficient documentation

## 2018-07-22 DIAGNOSIS — F17210 Nicotine dependence, cigarettes, uncomplicated: Secondary | ICD-10-CM | POA: Diagnosis not present

## 2018-07-22 DIAGNOSIS — R1031 Right lower quadrant pain: Secondary | ICD-10-CM | POA: Insufficient documentation

## 2018-07-22 DIAGNOSIS — Z79899 Other long term (current) drug therapy: Secondary | ICD-10-CM | POA: Insufficient documentation

## 2018-07-22 LAB — CBC
HEMATOCRIT: 37.1 % (ref 35.0–47.0)
HEMOGLOBIN: 12.1 g/dL (ref 12.0–16.0)
MCH: 28.6 pg (ref 26.0–34.0)
MCHC: 32.6 g/dL (ref 32.0–36.0)
MCV: 87.6 fL (ref 80.0–100.0)
Platelets: 250 10*3/uL (ref 150–440)
RBC: 4.24 MIL/uL (ref 3.80–5.20)
RDW: 15.3 % — ABNORMAL HIGH (ref 11.5–14.5)
WBC: 10.3 10*3/uL (ref 3.6–11.0)

## 2018-07-22 LAB — COMPREHENSIVE METABOLIC PANEL
ALT: 41 U/L (ref 0–44)
ANION GAP: 8 (ref 5–15)
AST: 23 U/L (ref 15–41)
Albumin: 4.3 g/dL (ref 3.5–5.0)
Alkaline Phosphatase: 67 U/L (ref 38–126)
BUN: 28 mg/dL — ABNORMAL HIGH (ref 6–20)
CHLORIDE: 106 mmol/L (ref 98–111)
CO2: 25 mmol/L (ref 22–32)
Calcium: 8.9 mg/dL (ref 8.9–10.3)
Creatinine, Ser: 0.66 mg/dL (ref 0.44–1.00)
Glucose, Bld: 89 mg/dL (ref 70–99)
POTASSIUM: 4.1 mmol/L (ref 3.5–5.1)
SODIUM: 139 mmol/L (ref 135–145)
TOTAL PROTEIN: 6.8 g/dL (ref 6.5–8.1)
Total Bilirubin: 1 mg/dL (ref 0.3–1.2)

## 2018-07-22 LAB — URINALYSIS, COMPLETE (UACMP) WITH MICROSCOPIC
BACTERIA UA: NONE SEEN
Bilirubin Urine: NEGATIVE
GLUCOSE, UA: NEGATIVE mg/dL
HGB URINE DIPSTICK: NEGATIVE
Ketones, ur: 5 mg/dL — AB
NITRITE: NEGATIVE
PROTEIN: NEGATIVE mg/dL
SPECIFIC GRAVITY, URINE: 1.027 (ref 1.005–1.030)
pH: 5 (ref 5.0–8.0)

## 2018-07-22 LAB — LIPASE, BLOOD: Lipase: 46 U/L (ref 11–51)

## 2018-07-22 MED ORDER — OXYCODONE-ACETAMINOPHEN 5-325 MG PO TABS: 1.0000 | ORAL_TABLET | Freq: Four times a day (QID) | ORAL | 0 refills | Status: AC | PRN

## 2018-07-22 MED ORDER — KETOROLAC TROMETHAMINE 30 MG/ML IJ SOLN
15.0000 mg | INTRAMUSCULAR | Status: AC
  Administered 2018-07-22: 15 mg via INTRAVENOUS
  Filled 2018-07-22: qty 1

## 2018-07-22 MED ORDER — SODIUM CHLORIDE 0.9 % IV BOLUS
1000.0000 mL | Freq: Once | INTRAVENOUS | Status: AC
  Administered 2018-07-22: 1000 mL via INTRAVENOUS

## 2018-07-22 MED ORDER — IOPAMIDOL (ISOVUE-300) INJECTION 61%
100.0000 mL | Freq: Once | INTRAVENOUS | Status: AC | PRN
  Administered 2018-07-22: 100 mL via INTRAVENOUS

## 2018-07-22 MED ORDER — ONDANSETRON HCL 4 MG/2ML IJ SOLN
4.0000 mg | Freq: Once | INTRAMUSCULAR | Status: AC
  Administered 2018-07-22: 4 mg via INTRAVENOUS
  Filled 2018-07-22: qty 2

## 2018-07-22 MED ORDER — NAPROXEN 500 MG PO TABS: 500.0000 mg | ORAL_TABLET | Freq: Two times a day (BID) | ORAL | 0 refills | Status: DC

## 2018-07-22 NOTE — Discharge Instructions (Signed)
Your lab tests and CT scan today were okay.  Continue using a heating pad and take pain medicine as needed to control your symptoms may be related to your prior endometriosis.  Follow-up with your family doctor and gynecologist this week for further evaluation.  If your symptoms worsen including unbearable pain, vomiting, fever or other new concerns, return to the ER for reevaluation.

## 2018-07-22 NOTE — ED Provider Notes (Signed)
[facility] Emergency Department Provider Note  ____________________________________________  Time seen: Approximately 1:40 PM  I have reviewed the triage vital signs and the nursing notes.   HISTORY  Chief Complaint Abdominal Pain    HPI Andrea Hood is a 42 y.o. female with a history of migraines, seizures, peptic ulcer disease, GERD who complains of right lower quadrant abdominal pain that started at 3:00 AM today.  Gradual onset, constant, worsening, now severe.  Radiates to the right upper abdomen as well.  No dysuria frequency urgency vaginal bleeding or discharge.  Never had anything like this before.  Worse with movement.  Better with bending her knees and hips.  Associated with nausea and vomiting.  No constipation or diarrhea.  No fevers or chills.  Last oral intake was yesterday morning due to just being too busy to eat or drink yesterday and then feeling too sick to eat since pain started this morning.  Past surgical history includes hysterectomy and cholecystectomy.      Past Medical History:  Diagnosis Date  . Asthma   . Gastric ulcer   . Migraines   . Pituitary cyst (HCC)   . Seizures [facility])      Patient Active Problem List   Diagnosis Date Noted  . BACK PAIN 11/19/2010  . INSOMNIA-SLEEP DISORDER-UNSPEC 08/27/2010  . ANXIETY 03/24/2009  . EATING DISORDER, UNSPECIFIED 03/24/2009  . DEPRESSION 03/24/2009  . MIGRAINE HEADACHE 03/24/2009  . GERD 03/24/2009  . GASTRIC ULCER 03/24/2009  . FATIGUE 03/24/2009     Past Surgical History:  Procedure Laterality Date  . ABDOMINAL HYSTERECTOMY    . CHOLECYSTECTOMY       Prior to Admission medications   Medication Sig Start Date End Date Taking? Authorizing Provider  albuterol (PROVENTIL HFA;VENTOLIN HFA) 108 (90 BASE) MCG/ACT inhaler Inhale 2 puffs into the lungs every 6 (six) hours as needed. Shortness of breath and wheezing     [provider]   brompheniramine-pseudoephedrine-DM 30-2-10 MG/5ML syrup Take 5 mLs by mouth 4 (four) times daily as needed. 11/23/17   Joni Reining, PA-C  cholecalciferol (VITAMIN D) 1000 UNITS tablet Take 1,000 Units by mouth daily.      [provider]  clonazePAM (KLONOPIN) 1 MG tablet TAKE 1 TABLET BY MOUTH TWICE DAILY Patient taking differently: 1.5 mg tid 02/07/11   Copland, Karleen Hampshire, MD  gabapentin (NEURONTIN) 300 MG capsule Take 300 mg by mouth at bedtime.      [provider]  HYDROcodone-acetaminophen (NORCO/VICODIN) 5-325 MG tablet Take 1-2 tablets by mouth every 6 (six) hours as needed for moderate pain. 06/01/17   Loleta Rose, MD  ibuprofen (ADVIL,MOTRIN) 600 MG tablet Take 1 tablet (600 mg total) by mouth every 8 (eight) hours as needed. 11/23/17   Joni Reining, PA-C  levETIRAcetam (KEPPRA) 1000 MG tablet Take 1,000 mg by mouth 2 (two) times daily.     [provider]  methocarbamol (ROBAXIN) 500 MG tablet Take 1 tablet (500 mg total) by mouth every 8 (eight) hours as needed for muscle spasms. Patient not taking: Reported on 06/01/2017 03/22/17   Tommi Rumps, PA-C  naproxen (NAPROSYN) 500 MG tablet Take 1 tablet (500 mg total) by mouth 2 (two) times daily with a meal. 07/22/18   Sharman Cheek, MD  ondansetron (ZOFRAN ODT) 4 MG disintegrating tablet Allow 1-2 tablets to dissolve in your mouth every 8 hours as needed for nausea/vomiting 06/01/17   Loleta Rose, MD  ondansetron (ZOFRAN) 8 MG tablet Take  1 tablet (8 mg total) by mouth every 8 (eight) hours as needed for nausea or vomiting. 11/23/17   Joni Reining, PA-C  oxyCODONE-acetaminophen (PERCOCET) 5-325 MG tablet Take 1 tablet by mouth every 6 (six) hours as needed for severe pain. 07/22/18 07/22/19  Sharman Cheek, MD  vitamin B-12 (CYANOCOBALAMIN) 1000 MCG tablet Take 1,000 mcg by mouth daily.      [provider]     Allergies Depakote [divalproex sodium] and Lamictal [lamotrigine]   No family  history on file.  Social History Social History   Tobacco Use  . Smoking status: Current Every Day Smoker    Packs/day: 0.50    Types: Cigarettes  . Smokeless tobacco: Never Used  Substance Use Topics  . Alcohol use: Yes  . Drug use: No    Review of Systems  Constitutional:   No fever or chills.  ENT:   No sore throat. No rhinorrhea. Cardiovascular:   No chest pain or syncope. Respiratory:   No dyspnea or cough. Gastrointestinal:   Positive as above for abdominal pain with vomiting.  No constipation.  .  Musculoskeletal:   Negative for focal pain or swelling All other systems reviewed and are negative except as documented above in ROS and HPI.  ____________________________________________   PHYSICAL EXAM:  VITAL SIGNS: ED Triage Vitals  Enc Vitals Group     BP 07/22/18 1204 (!) 126/53     Pulse Rate 07/22/18 1204 95     Resp 07/22/18 1204 19     Temp 07/22/18 1204 97.8 F (36.6 C)     Temp Source 07/22/18 1204 Oral     SpO2 07/22/18 1204 100 %     Weight 07/22/18 1205 180 lb (81.6 kg)     Height 07/22/18 1205 5\' 4"  (1.626 m)     Head Circumference --      Peak Flow --      Pain Score 07/22/18 1205 10     Pain Loc --      Pain Edu? --      Excl. in GC? --     Vital signs reviewed, nursing assessments reviewed.   Constitutional:   Alert and oriented. Non-toxic appearance. Eyes:   Conjunctivae are normal. EOMI. PERRL. ENT      Head:   Normocephalic and atraumatic.      Nose:   No congestion/rhinnorhea.       Mouth/Throat:   Dry mucous membranes, no pharyngeal erythema. No peritonsillar mass.       Neck:   No meningismus. Full ROM. Hematological/Lymphatic/Immunilogical:   No cervical lymphadenopathy. Cardiovascular:   RRR. Symmetric bilateral radial and DP pulses.  No murmurs. Cap refill less than 2 seconds. Respiratory:   Normal respiratory effort without tachypnea/retractions. Breath sounds are clear and equal bilaterally. No  wheezes/rales/rhonchi. Gastrointestinal:   Soft with diffuse tenderness, much worse in the right lower quadrant.  Positive Rovsing sign.. Non distended. There is no CVA tenderness.  No rebound, rigidity, or guarding. Musculoskeletal:   Normal range of motion in all extremities. No joint effusions.  No lower extremity tenderness.  No edema. Neurologic:   Normal speech and language.  Motor grossly intact. No acute focal neurologic deficits are appreciated.  Skin:    Skin is warm, dry and intact. No rash noted.  No petechiae, purpura, or bullae.  ____________________________________________    LABS (pertinent positives/negatives) (all labs ordered are listed, but only abnormal results are displayed) Labs Reviewed  COMPREHENSIVE METABOLIC PANEL - Abnormal; Notable  for the following components:      Result Value   BUN 28 (*)    All other components within normal limits  CBC - Abnormal; Notable for the following components:   RDW 15.3 (*)    All other components within normal limits  URINALYSIS, COMPLETE (UACMP) WITH MICROSCOPIC - Abnormal; Notable for the following components:   Color, Urine YELLOW (*)    APPearance HAZY (*)    Ketones, ur 5 (*)    Leukocytes, UA TRACE (*)    All other components within normal limits  LIPASE, BLOOD   ____________________________________________   EKG    ____________________________________________    RADIOLOGY  Ct Abdomen Pelvis W Contrast  Result Date: 07/22/2018 CLINICAL DATA:  Onset right side abdominal pain at 2 a.m. today. EXAM: CT ABDOMEN AND PELVIS WITH CONTRAST TECHNIQUE: Multidetector CT imaging of the abdomen and pelvis was performed using the standard protocol following bolus administration of intravenous contrast. CONTRAST:  100 mL ISOVUE-300 IOPAMIDOL (ISOVUE-300) INJECTION 61% COMPARISON:  CT abdomen and pelvis 06/01/2017 and 03/21/2008. FINDINGS: Lower chest: No pleural or pericardial effusion.  Lung bases clear. Hepatobiliary:  Status post cholecystectomy. Calcified lesion in the right hepatic lobe measuring 1.2 cm is unchanged. Biliary tree appears normal. Pancreas: Unremarkable. No pancreatic ductal dilatation or surrounding inflammatory changes. Spleen: Normal in size without focal abnormality. Adrenals/Urinary Tract: Adrenal glands are unremarkable. Kidneys are normal, without renal calculi, focal lesion, or hydronephrosis. Bladder is unremarkable. Stomach/Bowel: Stomach is within normal limits. Appendix appears normal. No evidence of bowel wall thickening, distention, or inflammatory changes. Vascular/Lymphatic: No significant vascular findings are present. No enlarged abdominal or pelvic lymph nodes. Reproductive: Status post hysterectomy. No adnexal masses. Other: Small fat containing umbilical hernia noted.  No ascites. Musculoskeletal: Negative. IMPRESSION: No acute abnormality abdomen or pelvis. No finding to explain the patient's symptoms. Electronically Signed   By: Drusilla Kanner M.D.   On: 07/22/2018 14:14    ____________________________________________   PROCEDURES Procedures  ____________________________________________  DIFFERENTIAL DIAGNOSIS   Appendicitis, colitis, ovarian cyst, less likely torsion  CLINICAL IMPRESSION / ASSESSMENT AND PLAN / ED COURSE  Pertinent labs & imaging results that were available during my care of the patient were reviewed by me and considered in my medical decision making (see chart for details).    Patient presents with right lower quadrant pain, severe.  Tenderness on exam, most concerning for appendicitis.  Will obtain CT scan of abdomen and pelvis for further evaluation.  IV fluids 1 L saline bolus, Toradol 50 mg IV for pain control, Zofran 4 mg IV for nausea.  Clinical Course as of Jul 23 1611  Sat Jul 22, 2018  1433 CT unremarkable.  No evidence of appendicitis or other acute intra-abdominal pathology.  Consult substance reporting system reviewed which does show  multiple recent prescriptions for opioids which may be causing constipation symptoms.   [PS]    Clinical Course User Index [PS] Sharman Cheek, MD    ----------------------------------------- 4:12 PM on 07/22/2018 -----------------------------------------  Results discussed with the patient.  She now reveals that she also has a long history of endometriosis that felt similar to the symptoms.  That is the most likely explanation at this point given the negative CT scan.  Vitals unremarkable, she is not in distress, I doubt appendicitis or other acute intra-abdominal pathology.  Recommend she follow-up with her family doctor and gynecologist for further assessment of her recurrent symptoms.  Counseled to return if her symptoms worsen due to the possibility of  early appendicitis.   ____________________________________________   FINAL CLINICAL IMPRESSION(S) / ED DIAGNOSES    Final diagnoses:  RLQ abdominal pain     ED Discharge Orders         Ordered    oxyCODONE-acetaminophen (PERCOCET) 5-325 MG tablet  Every 6 hours PRN     07/22/18 1611    naproxen (NAPROSYN) 500 MG tablet  2 times daily with meals     07/22/18 1611          Portions of this note were generated with dragon dictation software. Dictation errors may occur despite best attempts at proofreading.    Sharman Cheek, MD 07/22/18 [phone]

## 2018-07-22 NOTE — ED Triage Notes (Addendum)
Pt comes via POV with c/o RLQ pain that started last night. Pt states it is sharp pains that continues to get worse that had started on her right side. Pt states nausea and vomiting. Pt states she also felt flushed. Pt denies dizziness. Pt is alert and oriented.  Pt states 10/10 pain. Pt also states she hasn't been able to eat anything.

## 2018-07-22 NOTE — ED Notes (Signed)
This RN went to introduce herself to pt. Pt was sitting at the foot of the bed stating that she was waiting on her paperwork and that she was fine. This RN saw pt's IV hanging from IV pole to which I asked if she took out herself to which she admitted to. EDP aware.

## 2018-07-22 NOTE — ED Triage Notes (Signed)
FIRST NURSE NOTE-c/o RLQ pain. Has had vomiting. Ambulatory, NAD at this time.

## 2018-07-22 NOTE — ED Notes (Addendum)
Pt back in room from CT 

## 2018-08-20 ENCOUNTER — Encounter: Payer: Self-pay | Admitting: Emergency Medicine

## 2018-08-20 ENCOUNTER — Emergency Department
Admission: EM | Admit: 2018-08-20 | Discharge: 2018-08-20 | Disposition: A | Discharge status: Home / Self Care | Payer: 59 | Attending: Emergency Medicine | Admitting: Emergency Medicine

## 2018-08-20 DIAGNOSIS — F17210 Nicotine dependence, cigarettes, uncomplicated: Secondary | ICD-10-CM | POA: Insufficient documentation

## 2018-08-20 DIAGNOSIS — Z79899 Other long term (current) drug therapy: Secondary | ICD-10-CM | POA: Insufficient documentation

## 2018-08-20 DIAGNOSIS — M273 Alveolitis of jaws: Secondary | ICD-10-CM | POA: Insufficient documentation

## 2018-08-20 DIAGNOSIS — J45909 Unspecified asthma, uncomplicated: Secondary | ICD-10-CM | POA: Insufficient documentation

## 2018-08-20 MED ORDER — CLINDAMYCIN PHOSPHATE 300 MG/2ML IJ SOLN
600.0000 mg | Freq: Once | INTRAMUSCULAR | Status: AC
  Administered 2018-08-20: 600 mg via INTRAMUSCULAR
  Filled 2018-08-20: qty 4

## 2018-08-20 MED ORDER — CLINDAMYCIN HCL 300 MG PO CAPS: 300.0000 mg | ORAL_CAPSULE | Freq: Three times a day (TID) | ORAL | 0 refills | Status: AC

## 2018-08-20 MED ORDER — TRAMADOL HCL 50 MG PO TABS: 50.0000 mg | ORAL_TABLET | Freq: Four times a day (QID) | ORAL | 0 refills | Status: AC | PRN

## 2018-08-20 MED ORDER — DEXAMETHASONE SODIUM PHOSPHATE 10 MG/ML IJ SOLN
10.0000 mg | Freq: Once | INTRAMUSCULAR | Status: AC
  Administered 2018-08-20: 10 mg via INTRAMUSCULAR
  Filled 2018-08-20: qty 1

## 2018-08-20 NOTE — ED Notes (Signed)
See triage note  Presents with swelling to left lower gumline  States she had a tooth pulled last week  Having increased pain area  Also having some nasuea

## 2018-08-20 NOTE — ED Triage Notes (Signed)
Patient states she had a tooth pulled on Friday and has been having severe tooth pain radiating into her jaw.  Patient states it is very painful to open her mouth.  Patient is tearful in triage.

## 2018-08-20 NOTE — ED Provider Notes (Signed)
[facility] Emergency Department Provider Note  ____________________________________________  Time seen: Approximately 12:20 PM  I have reviewed the triage vital signs and the nursing notes.   HISTORY  Chief Complaint Dental Pain    HPI Andrea Hood is a 42 y.o. female that presents to the emergency department for evaluation of left-sided dental pain after dental extraction 2 days ago.  Patient had back left molar removed 2 days ago.  She states that area has been swelling and painful since.  She can only occasionally taste blood in her mouth.  She has been taking amoxicillin since procedure.  She has also been taking oxycodone that she was prescribed for pain but is out.   Past Medical History:  Diagnosis Date  . Asthma   . Gastric ulcer   . Migraines   . Pituitary cyst (HCC)   . Seizures [facility])     Patient Active Problem List   Diagnosis Date Noted  . BACK PAIN 11/19/2010  . INSOMNIA-SLEEP DISORDER-UNSPEC 08/27/2010  . ANXIETY 03/24/2009  . EATING DISORDER, UNSPECIFIED 03/24/2009  . DEPRESSION 03/24/2009  . MIGRAINE HEADACHE 03/24/2009  . GERD 03/24/2009  . GASTRIC ULCER 03/24/2009  . FATIGUE 03/24/2009    Past Surgical History:  Procedure Laterality Date  . ABDOMINAL HYSTERECTOMY    . CHOLECYSTECTOMY      Prior to Admission medications   Medication Sig Start Date End Date Taking? Authorizing Provider  albuterol (PROVENTIL HFA;VENTOLIN HFA) 108 (90 BASE) MCG/ACT inhaler Inhale 2 puffs into the lungs every 6 (six) hours as needed. Shortness of breath and wheezing     [provider]  brompheniramine-pseudoephedrine-DM 30-2-10 MG/5ML syrup Take 5 mLs by mouth 4 (four) times daily as needed. 11/23/17   Joni Reining, PA-C  cholecalciferol (VITAMIN D) 1000 UNITS tablet Take 1,000 Units by mouth daily.      [provider]  clindamycin (CLEOCIN) 300 MG capsule Take 1 capsule (300 mg total) by mouth 3 (three) times daily for  10 days. 08/20/18 08/30/18  [name], PA-C  clonazePAM (KLONOPIN) 1 MG tablet TAKE 1 TABLET BY MOUTH TWICE DAILY Patient taking differently: 1.5 mg tid 02/07/11   Copland, Karleen Hampshire, MD  gabapentin (NEURONTIN) 300 MG capsule Take 300 mg by mouth at bedtime.      [provider]  HYDROcodone-acetaminophen (NORCO/VICODIN) 5-325 MG tablet Take 1-2 tablets by mouth every 6 (six) hours as needed for moderate pain. 06/01/17   Loleta Rose, MD  ibuprofen (ADVIL,MOTRIN) 600 MG tablet Take 1 tablet (600 mg total) by mouth every 8 (eight) hours as needed. 11/23/17   Joni Reining, PA-C  levETIRAcetam (KEPPRA) 1000 MG tablet Take 1,000 mg by mouth 2 (two) times daily.     [provider]  methocarbamol (ROBAXIN) 500 MG tablet Take 1 tablet (500 mg total) by mouth every 8 (eight) hours as needed for muscle spasms. Patient not taking: Reported on 06/01/2017 03/22/17   Tommi Rumps, PA-C  naproxen (NAPROSYN) 500 MG tablet Take 1 tablet (500 mg total) by mouth 2 (two) times daily with a meal. 07/22/18   Sharman Cheek, MD  ondansetron (ZOFRAN ODT) 4 MG disintegrating tablet Allow 1-2 tablets to dissolve in your mouth every 8 hours as needed for nausea/vomiting 06/01/17   Loleta Rose, MD  ondansetron (ZOFRAN) 8 MG tablet Take 1 tablet (8 mg total) by mouth every 8 (eight) hours as needed for nausea or vomiting. 11/23/17   Joni Reining, PA-C  oxyCODONE-acetaminophen (PERCOCET) [phone]  MG tablet Take 1 tablet by mouth every 6 (six) hours as needed for severe pain. 07/22/18 07/22/19  Sharman Cheek, MD  traMADol (ULTRAM) 50 MG tablet Take 1 tablet (50 mg total) by mouth every 6 (six) hours as needed for up to 2 days. 08/20/18 08/22/18  [name], PA-C  vitamin B-12 (CYANOCOBALAMIN) 1000 MCG tablet Take 1,000 mcg by mouth daily.      [provider]    Allergies Depakote [divalproex sodium]; Lamictal [lamotrigine]; Topiramate; and Hydrocodone  No family history on file.  Social  History Social History   Tobacco Use  . Smoking status: Current Every Day Smoker    Packs/day: 0.50    Types: Cigarettes  . Smokeless tobacco: Never Used  Substance Use Topics  . Alcohol use: Yes  . Drug use: No     Review of Systems  Constitutional: No fever/chills Gastrointestinal: No nausea, no vomiting.  Musculoskeletal: Negative for musculoskeletal pain. Skin: Negative for rash, abrasions, lacerations, ecchymosis. Neurological: Negative for headaches, numbness or tingling   ____________________________________________   PHYSICAL EXAM:  VITAL SIGNS: ED Triage Vitals  Enc Vitals Group     BP 08/20/18 1115 (!) 146/78     Pulse Rate 08/20/18 1115 (!) 106     Resp 08/20/18 1115 14     Temp 08/20/18 1115 98.1 F (36.7 C)     Temp Source 08/20/18 1115 Oral     SpO2 08/20/18 1115 96 %     Weight 08/20/18 1115 183 lb (83 kg)     Height 08/20/18 1115 5\' 4"  (1.626 m)     Head Circumference --      Peak Flow --      Pain Score 08/20/18 1118 10     Pain Loc --      Pain Edu? --      Excl. in GC? --      Constitutional: Alert and oriented. Well appearing and in no acute distress. Eyes: Conjunctivae are normal. PERRL. EOMI. Head: Atraumatic. ENT:      Ears:      Nose: No congestion/rhinnorhea.      Mouth/Throat: Mucous membranes are moist.  Minimal swelling to left cheek.  Left bottom canine extracted.  No visible drainage.  Able to open and close her mouth without difficulty. Neck: No stridor. Cardiovascular: Normal rate, regular rhythm.  Good peripheral circulation. Respiratory: Normal respiratory effort without tachypnea or retractions. Lungs CTAB. Good air entry to the bases with no decreased or absent breath sounds. Musculoskeletal: Full range of motion to all extremities. No gross deformities appreciated. Neurologic:  Normal speech and language. No gross focal neurologic deficits are appreciated.  Skin:  Skin is warm, dry and intact. No rash  noted. Psychiatric: Mood and affect are normal. Speech and behavior are normal. Patient exhibits appropriate insight and judgement.   ____________________________________________   LABS (all labs ordered are listed, but only abnormal results are displayed)  Labs Reviewed - No data to display ____________________________________________  EKG   ____________________________________________  RADIOLOGY   No results found.  ____________________________________________    PROCEDURES  Procedure(s) performed:    Procedures    Medications  clindamycin (CLEOCIN) injection 600 mg (600 mg Intramuscular Given 08/20/18 1248)  dexamethasone (DECADRON) injection 10 mg (10 mg Intramuscular Given 08/20/18 1248)     ____________________________________________   INITIAL IMPRESSION / ASSESSMENT AND PLAN / ED COURSE  Pertinent labs & imaging results that were available during my care of the patient were reviewed by me and considered  in my medical decision making (see chart for details).  Review of the [place] CSRS was performed in accordance of the [facility] prior to dispensing any controlled drugs.     Patient's diagnosis is consistent with dry socket.  Vital signs and exam are reassuring.  IM clindamycin was given to cover for infection.  Patient will be discharged home with prescriptions for clindamycin and a short course of tramadol. Patient is to follow up with dentist as directed. Patient is given ED precautions to return to the ED for any worsening or new symptoms.     ____________________________________________  FINAL CLINICAL IMPRESSION(S) / ED DIAGNOSES  Final diagnoses:  Dry socket      NEW MEDICATIONS STARTED DURING THIS VISIT:  ED Discharge Orders         Ordered    traMADol (ULTRAM) 50 MG tablet  Every 6 hours PRN     08/20/18 1251    clindamycin (CLEOCIN) 300 MG capsule  3 times daily     08/20/18 1251              This chart was dictated using voice  recognition software/Dragon. Despite best efforts to proofread, errors can occur which can change the meaning. Any change was purely unintentional.    [name], PA-C 08/20/18 1402    Pershing Proud Myra Rude, MD 08/20/18 [phone]

## 2018-10-11 ENCOUNTER — Encounter: Payer: Self-pay | Admitting: Emergency Medicine

## 2018-10-11 ENCOUNTER — Emergency Department: Payer: Self-pay

## 2018-10-11 ENCOUNTER — Other Ambulatory Visit: Payer: Self-pay

## 2018-10-11 ENCOUNTER — Emergency Department
Admission: EM | Admit: 2018-10-11 | Discharge: 2018-10-11 | Disposition: A | Discharge status: Home / Self Care | Payer: Self-pay | Attending: Emergency Medicine | Admitting: Emergency Medicine

## 2018-10-11 DIAGNOSIS — Y9301 Activity, walking, marching and hiking: Secondary | ICD-10-CM | POA: Insufficient documentation

## 2018-10-11 DIAGNOSIS — W108XXA Fall (on) (from) other stairs and steps, initial encounter: Secondary | ICD-10-CM | POA: Insufficient documentation

## 2018-10-11 DIAGNOSIS — M25562 Pain in left knee: Secondary | ICD-10-CM | POA: Insufficient documentation

## 2018-10-11 DIAGNOSIS — Y929 Unspecified place or not applicable: Secondary | ICD-10-CM | POA: Insufficient documentation

## 2018-10-11 DIAGNOSIS — F17210 Nicotine dependence, cigarettes, uncomplicated: Secondary | ICD-10-CM | POA: Insufficient documentation

## 2018-10-11 DIAGNOSIS — Z79899 Other long term (current) drug therapy: Secondary | ICD-10-CM | POA: Insufficient documentation

## 2018-10-11 DIAGNOSIS — Y998 Other external cause status: Secondary | ICD-10-CM | POA: Insufficient documentation

## 2018-10-11 DIAGNOSIS — J45909 Unspecified asthma, uncomplicated: Secondary | ICD-10-CM | POA: Insufficient documentation

## 2018-10-11 MED ORDER — PREDNISONE 10 MG PO TABS: 40.0000 mg | ORAL_TABLET | Freq: Every day | ORAL | 0 refills | Status: AC

## 2018-10-11 MED ORDER — OXYCODONE-ACETAMINOPHEN 5-325 MG PO TABS
1.0000 | ORAL_TABLET | Freq: Once | ORAL | Status: AC
  Administered 2018-10-11: 1 via ORAL
  Filled 2018-10-11: qty 1

## 2018-10-11 NOTE — ED Notes (Signed)
AAOx3.  Skin warm and dry.  NAD 

## 2018-10-11 NOTE — ED Notes (Signed)
AAOx3.  Skin warm and dry no apparent distress 

## 2018-10-11 NOTE — ED Notes (Signed)
See triage note  Presents with pain to left knee  States she has had pain to same knee for several months   States increased pain today  And that her knee gave out and she fell   Having pain to anterior aspect on knee

## 2018-10-11 NOTE — ED Triage Notes (Signed)
L knee pain. States has been having L knee pain x 2 months x 2 months, today fell on it and pain increased.

## 2018-10-11 NOTE — ED Provider Notes (Signed)
[name] Emergency Department Provider Note  ____________________________________________  Time seen: Approximately 2:57 PM  I have reviewed the triage vital signs and the nursing notes.   HISTORY  Chief Complaint Knee Pain    HPI Andrea Hood is a 42 y.o. female that presents emergency department for evaluation of knee pain for several months, worsening for the last week.  Patient states that her knee has been giving out on her.  Her knee gave out on her while she was going down stairs this morning, causing her to fall.  She did not have any injury from the fall.  Her knee has been painful since.  She is concerned there is arthritis in her knee.  She hears popping and grinding to her knee.  No calf pain, wounds.   Past Medical History:  Diagnosis Date  . Asthma   . Gastric ulcer   . Migraines   . Pituitary cyst (HCC)   . Seizures [facility](HCC)     Patient Active Problem List   Diagnosis Date Noted  . BACK PAIN 11/19/2010  . INSOMNIA-SLEEP DISORDER-UNSPEC 08/27/2010  . ANXIETY 03/24/2009  . EATING DISORDER, UNSPECIFIED 03/24/2009  . DEPRESSION 03/24/2009  . MIGRAINE HEADACHE 03/24/2009  . GERD 03/24/2009  . GASTRIC ULCER 03/24/2009  . FATIGUE 03/24/2009    Past Surgical History:  Procedure Laterality Date  . ABDOMINAL HYSTERECTOMY    . CHOLECYSTECTOMY      Prior to Admission medications   Medication Sig Start Date End Date Taking? Authorizing Provider  albuterol (PROVENTIL HFA;VENTOLIN HFA) 108 (90 BASE) MCG/ACT inhaler Inhale 2 puffs into the lungs every 6 (six) hours as needed. Shortness of breath and wheezing     [provider]  brompheniramine-pseudoephedrine-DM 30-2-10 MG/5ML syrup Take 5 mLs by mouth 4 (four) times daily as needed. 11/23/17   [name], [name], PA-C  cholecalciferol (VITAMIN D) 1000 UNITS tablet Take 1,000 Units by mouth daily.      [provider]  clonazePAM (KLONOPIN) 1 MG tablet TAKE 1 TABLET BY MOUTH  TWICE DAILY Patient taking differently: 1.5 mg tid 02/07/11   Copland, Karleen HampshireSpencer, MD  gabapentin (NEURONTIN) 300 MG capsule Take 300 mg by mouth at bedtime.      [provider]  HYDROcodone-acetaminophen (NORCO/VICODIN) 5-325 MG tablet Take 1-2 tablets by mouth every 6 (six) hours as needed for moderate pain. 06/01/17   Loleta RoseForbach, [name], MD  ibuprofen (ADVIL,MOTRIN) 600 MG tablet Take 1 tablet (600 mg total) by mouth every 8 (eight) hours as needed. 11/23/17   [name], [name], PA-C  levETIRAcetam (KEPPRA) 1000 MG tablet Take 1,000 mg by mouth 2 (two) times daily.     [provider]  methocarbamol (ROBAXIN) 500 MG tablet Take 1 tablet (500 mg total) by mouth every 8 (eight) hours as needed for muscle spasms. Patient not taking: Reported on 06/01/2017 03/22/17   [name], [name], PA-C  naproxen (NAPROSYN) 500 MG tablet Take 1 tablet (500 mg total) by mouth 2 (two) times daily with a meal. 07/22/18   [name], Phillip, MD  ondansetron (ZOFRAN ODT) 4 MG disintegrating tablet Allow 1-2 tablets to dissolve in your mouth every 8 hours as needed for nausea/vomiting 06/01/17   Loleta RoseForbach, [name], MD  ondansetron (ZOFRAN) 8 MG tablet Take 1 tablet (8 mg total) by mouth every 8 (eight) hours as needed for nausea or vomiting. 11/23/17   [name], [name], PA-C  oxyCODONE-acetaminophen (PERCOCET) 5-325 MG tablet Take 1 tablet by mouth every 6 (six) hours  as needed for severe pain. 07/22/18 07/22/19  Sharman Cheek, MD  predniSONE (DELTASONE) 10 MG tablet Take 4 tablets (40 mg total) by mouth daily for 5 days. 10/11/18 10/16/18  [name], PA-C  vitamin B-12 (CYANOCOBALAMIN) 1000 MCG tablet Take 1,000 mcg by mouth daily.      [provider]    Allergies Depakote [divalproex sodium]; Lamictal [lamotrigine]; and Topiramate  No family history on file.  Social History Social History   Tobacco Use  . Smoking status: Current Every Day Smoker    Packs/day: 0.50    Types: Cigarettes  . Smokeless  tobacco: Never Used  Substance Use Topics  . Alcohol use: Yes  . Drug use: No     Review of Systems  Constitutional: No fever/chills Cardiovascular: No chest pain. Respiratory: No SOB. Gastrointestinal: No abdominal pain.  No nausea, no vomiting.  Musculoskeletal: Positive for knee pain. Skin: Negative for rash, abrasions, lacerations, ecchymosis. Neurological: Negative for  numbness or tingling   ____________________________________________   PHYSICAL EXAM:  VITAL SIGNS: ED Triage Vitals [10/11/18 1312]  Enc Vitals Group     BP (!) 114/98     Pulse Rate 100     Resp 20     Temp 98 F (36.7 C)     Temp Source Oral     SpO2 98 %     Weight 187 lb (84.8 kg)     Height 5\' 3"  (1.6 m)     Head Circumference      Peak Flow      Pain Score 9     Pain Loc      Pain Edu?      Excl. in GC?      Constitutional: Alert and oriented. Well appearing and in no acute distress. Eyes: Conjunctivae are normal. PERRL. EOMI. Head: Atraumatic. ENT:      Ears:      Nose: No congestion/rhinnorhea.      Mouth/Throat: Mucous membranes are moist.  Neck: No stridor.   Cardiovascular: Normal rate, regular rhythm.  Good peripheral circulation. Respiratory: Normal respiratory effort without tachypnea or retractions. Lungs CTAB. Good air entry to the bases with no decreased or absent breath sounds. Musculoskeletal: Full range of motion to all extremities. No gross deformities appreciated. No tenderness to palpation. No effusion noted. Negative anterior drawer, posterior drawer, valgus, varus, mcMurray, patella apprehension, apley grind. Neurologic:  Normal speech and language. No gross focal neurologic deficits are appreciated.  Skin:  Skin is warm, dry and intact. No rash noted. Psychiatric: Mood and affect are normal. Speech and behavior are normal. Patient exhibits appropriate insight and judgement.   ____________________________________________   LABS (all labs ordered are listed,  but only abnormal results are displayed)  Labs Reviewed - No data to display ____________________________________________  EKG   ____________________________________________  RADIOLOGY Lexine Baton, personally viewed and evaluated these images (plain radiographs) as part of my medical decision making, as well as reviewing the written report by the radiologist.  Dg Knee Complete 4 Views Left  Result Date: 10/11/2018 CLINICAL DATA:  Left knee pain x2 months EXAM: LEFT KNEE - COMPLETE 4+ VIEW COMPARISON:  None. FINDINGS: No evidence of fracture, dislocation, or joint effusion. No evidence of arthropathy or other focal bone abnormality. Soft tissues are unremarkable. IMPRESSION: Negative. Electronically Signed   By: Tollie Eth M.D.   On: 10/11/2018 13:50    ____________________________________________    PROCEDURES  Procedure(s) performed:    Procedures    Medications  oxyCODONE-acetaminophen (  PERCOCET/ROXICET) 5-325 MG per tablet 1 tablet (1 tablet Oral Given 10/11/18 1509)     ____________________________________________   INITIAL IMPRESSION / ASSESSMENT AND PLAN / ED COURSE  Pertinent labs & imaging results that were available during my care of the patient were reviewed by me and considered in my medical decision making (see chart for details).  Review of the [place] CSRS was performed in accordance of the [facility] prior to dispensing any controlled drugs.     Patient presented the emergency department for evaluation of knee pain.  Vital signs and exam are reassuring.  X-ray negative for acute abnormalities.  Knee exam is unremarkable.  Knee was Ace wrapped.  Crutches were given.  Patient has been given consistent prescriptions for tramadol from primary care for the last 2 months.  Patient is unable to take NSAIDs due to gastric ulcer.  Patient will be discharged home with prescriptions for prednisone for inflammation. Patient is to follow up with primary care as directed.  Patient is given ED precautions to return to the ED for any worsening or new symptoms.     ____________________________________________  FINAL CLINICAL IMPRESSION(S) / ED DIAGNOSES  Final diagnoses:  Acute pain of left knee      NEW MEDICATIONS STARTED DURING THIS VISIT:  ED Discharge Orders         Ordered    predniSONE (DELTASONE) 10 MG tablet  Daily     10/11/18 1518              This chart was dictated using voice recognition software/Dragon. Despite best efforts to proofread, errors can occur which can change the meaning. Any change was purely unintentional.    Enid DerryWagner, Kainen Struckman, PA-C 10/11/18 1529    Emily FilbertWilliams, Jonathan E, MD 10/11/18 [phone]

## 2018-12-10 ENCOUNTER — Emergency Department
Admission: EM | Admit: 2018-12-10 | Discharge: 2018-12-10 | Disposition: A | Discharge status: Home / Self Care | Payer: Self-pay | Attending: Emergency Medicine | Admitting: Emergency Medicine

## 2018-12-10 ENCOUNTER — Emergency Department: Payer: Self-pay

## 2018-12-10 ENCOUNTER — Other Ambulatory Visit: Payer: Self-pay

## 2018-12-10 DIAGNOSIS — Z79899 Other long term (current) drug therapy: Secondary | ICD-10-CM | POA: Insufficient documentation

## 2018-12-10 DIAGNOSIS — Y939 Activity, unspecified: Secondary | ICD-10-CM | POA: Insufficient documentation

## 2018-12-10 DIAGNOSIS — Y929 Unspecified place or not applicable: Secondary | ICD-10-CM | POA: Insufficient documentation

## 2018-12-10 DIAGNOSIS — Y999 Unspecified external cause status: Secondary | ICD-10-CM | POA: Insufficient documentation

## 2018-12-10 DIAGNOSIS — F17210 Nicotine dependence, cigarettes, uncomplicated: Secondary | ICD-10-CM | POA: Insufficient documentation

## 2018-12-10 DIAGNOSIS — J45909 Unspecified asthma, uncomplicated: Secondary | ICD-10-CM | POA: Insufficient documentation

## 2018-12-10 DIAGNOSIS — W231XXA Caught, crushed, jammed, or pinched between stationary objects, initial encounter: Secondary | ICD-10-CM | POA: Insufficient documentation

## 2018-12-10 DIAGNOSIS — S60211A Contusion of right wrist, initial encounter: Secondary | ICD-10-CM | POA: Insufficient documentation

## 2018-12-10 DIAGNOSIS — S6992XA Unspecified injury of left wrist, hand and finger(s), initial encounter: Secondary | ICD-10-CM

## 2018-12-10 MED ORDER — IBUPROFEN 600 MG PO TABS: 600.0000 mg | ORAL_TABLET | Freq: Four times a day (QID) | ORAL | 0 refills | Status: DC | PRN

## 2018-12-10 MED ORDER — OXYCODONE-ACETAMINOPHEN 5-325 MG PO TABS
1.0000 | ORAL_TABLET | Freq: Once | ORAL | Status: AC
  Administered 2018-12-10: 1 via ORAL
  Filled 2018-12-10: qty 1

## 2018-12-10 MED ORDER — TRAMADOL HCL 50 MG PO TABS: 50.0000 mg | ORAL_TABLET | Freq: Three times a day (TID) | ORAL | 0 refills | Status: AC | PRN

## 2018-12-10 NOTE — ED Notes (Signed)
See triage note  Presents with pain to left wrist area  States had gotten left arm in door  Good pulses noted
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
## 2018-12-10 NOTE — ED Notes (Signed)
Pt verbalizes d/c understanding and follow up. PT in NAD, pt ambulatory, PT waiting on daughter for ride in lobby

## 2019-02-06 IMAGING — CR DG LUMBAR SPINE COMPLETE 4+V
5 series · 5 of 5 positions shown · non-contrast
Comparison: 09/04/2016.

CLINICAL DATA: Low back pain following a fall down stairs today.

EXAM:
LUMBAR SPINE - COMPLETE 4+ VIEW

[l-spine ap]
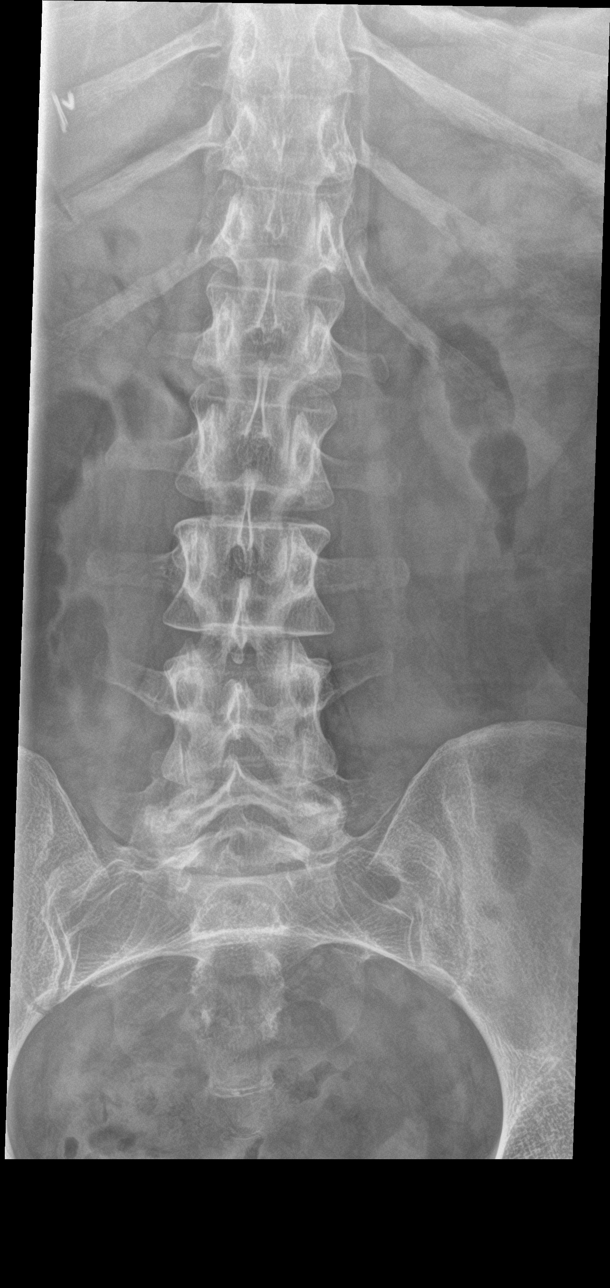

[l-spine obl (1 of 2)]
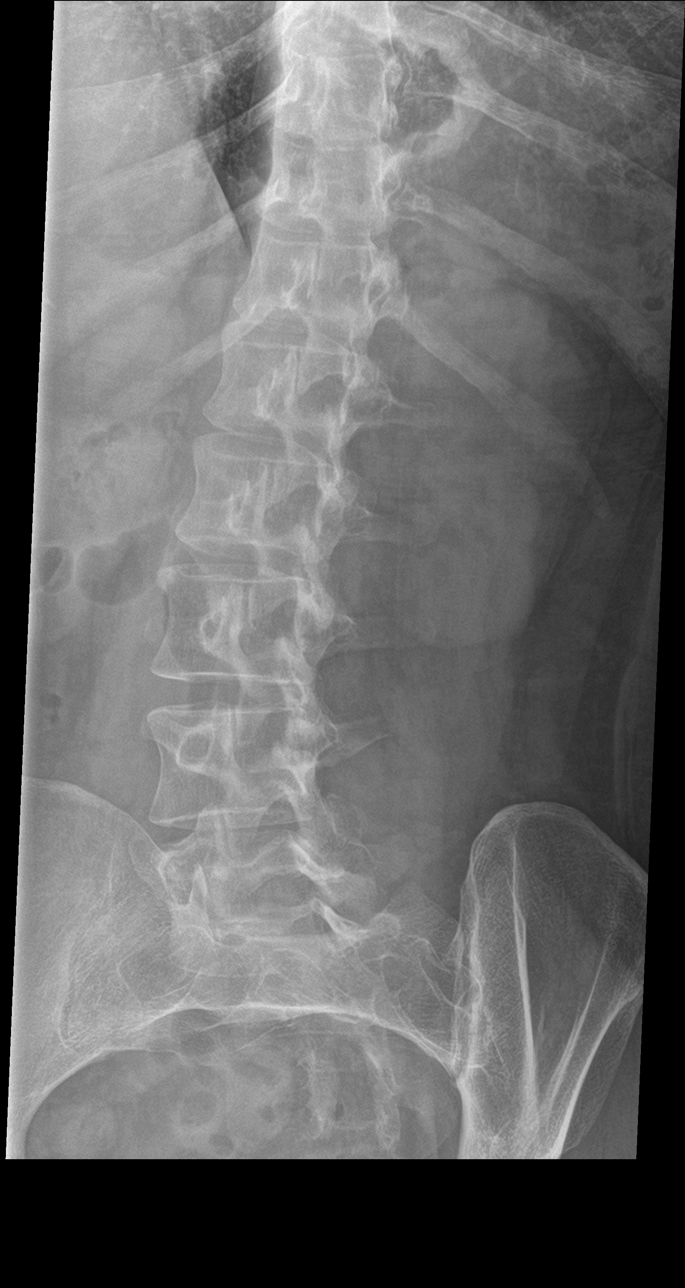

[l-spine obl (2 of 2)]
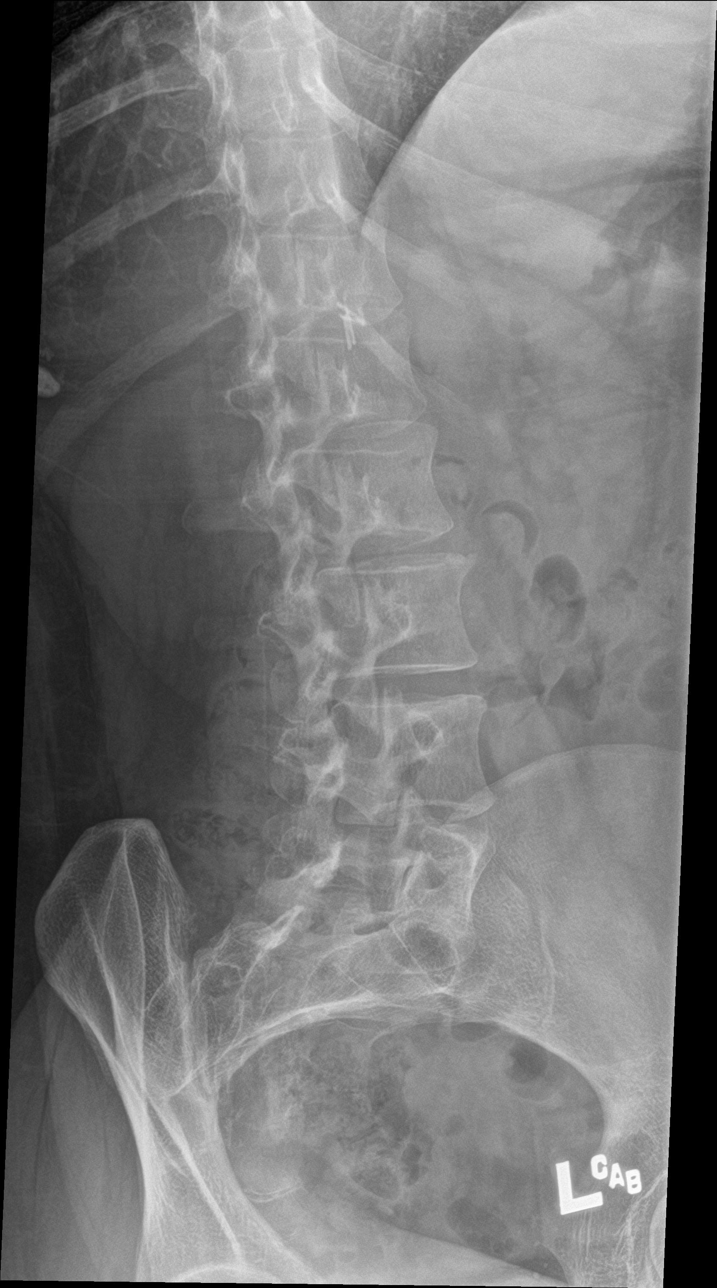

[l-spine lat]
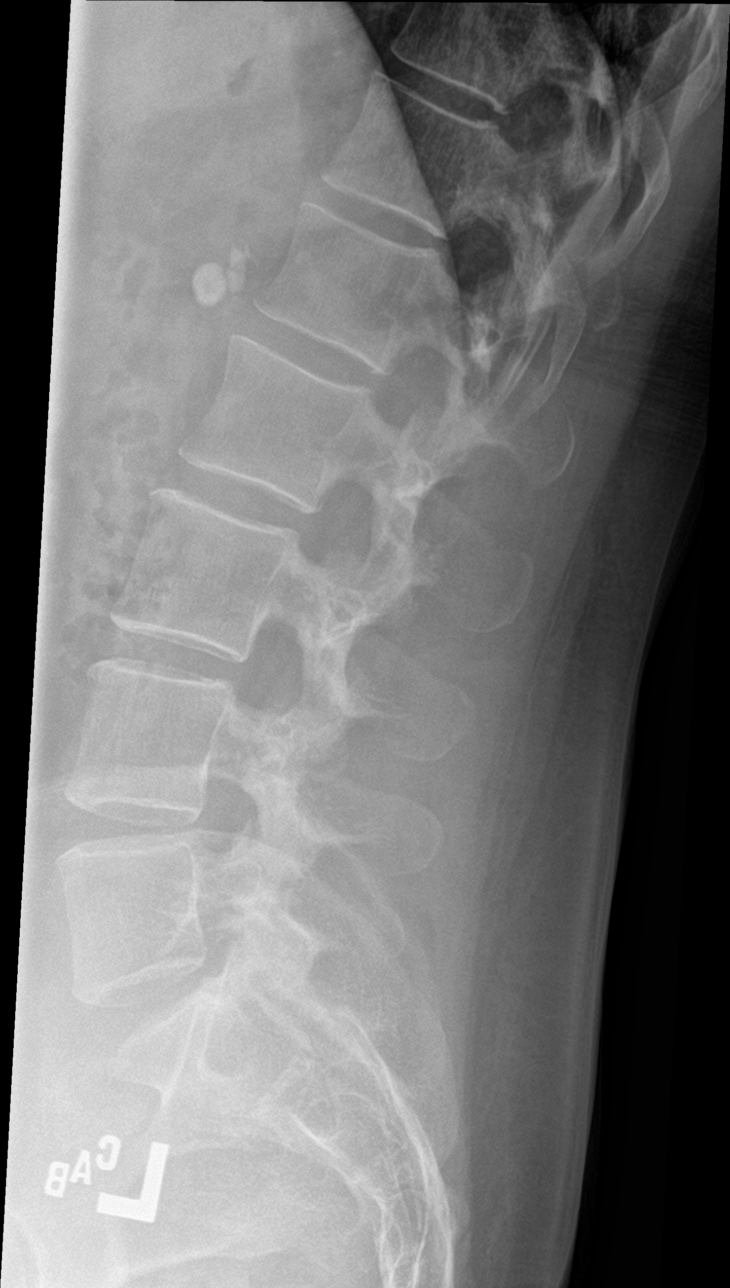

[l-spine spot]
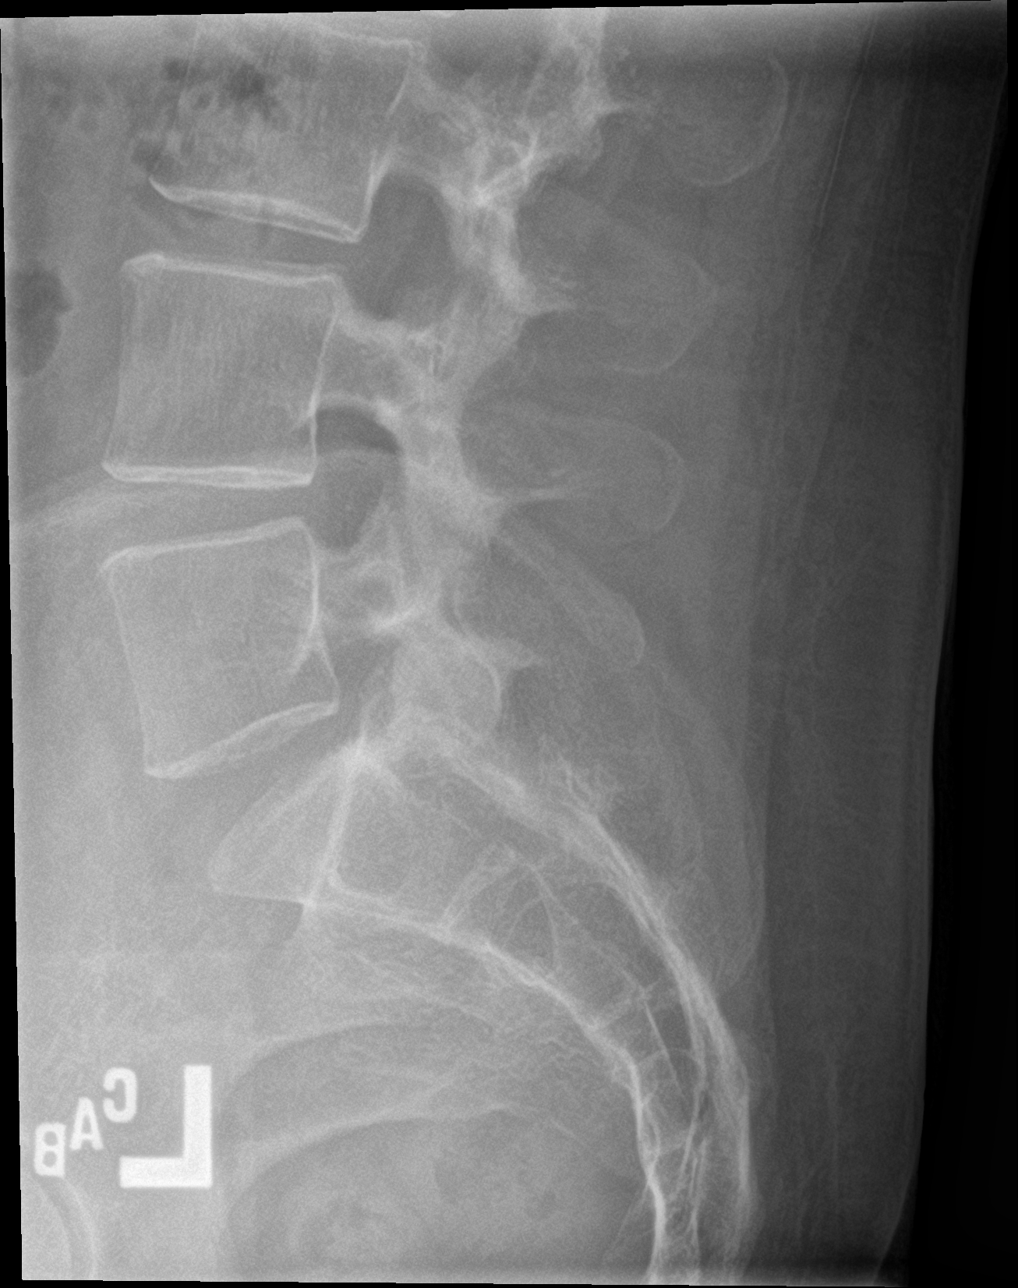

[5 of 5 positions shown; findings below may reference images not displayed]

FINDINGS: Five non-rib-bearing lumbar vertebrae. These have normal appearances
with no fractures, pars defects or subluxations. Cholecystectomy
clips. Stable right upper quadrant abdominal calcification.
IMPRESSION: No acute abnormality.

## 2019-11-12 ENCOUNTER — Ambulatory Visit: Payer: Self-pay | Admitting: Orthopaedic Surgery

## 2019-11-20 ENCOUNTER — Ambulatory Visit: Payer: Self-pay | Admitting: Orthopedic Surgery

## 2019-12-17 DIAGNOSIS — Z1371 Encounter for nonprocreative screening for genetic disease carrier status: Secondary | ICD-10-CM

## 2019-12-17 HISTORY — DX: Encounter for nonprocreative screening for genetic disease carrier status: Z13.71

## 2019-12-24 ENCOUNTER — Ambulatory Visit: Payer: Self-pay | Admitting: Obstetrics and Gynecology

## 2019-12-25 ENCOUNTER — Ambulatory Visit (INDEPENDENT_AMBULATORY_CARE_PROVIDER_SITE_OTHER): Payer: 59 | Admitting: Obstetrics and Gynecology

## 2019-12-25 ENCOUNTER — Encounter: Payer: Self-pay | Admitting: Obstetrics and Gynecology

## 2019-12-25 ENCOUNTER — Other Ambulatory Visit: Payer: Self-pay

## 2019-12-25 VITALS — BP 120/70 | Ht 64.0 in | Wt 156.0 lb

## 2019-12-25 DIAGNOSIS — Z1331 Encounter for screening for depression: Secondary | ICD-10-CM | POA: Diagnosis not present

## 2019-12-25 DIAGNOSIS — Z124 Encounter for screening for malignant neoplasm of cervix: Secondary | ICD-10-CM

## 2019-12-25 DIAGNOSIS — Z1231 Encounter for screening mammogram for malignant neoplasm of breast: Secondary | ICD-10-CM

## 2019-12-25 DIAGNOSIS — Z0000 Encounter for general adult medical examination without abnormal findings: Secondary | ICD-10-CM | POA: Diagnosis not present

## 2019-12-25 DIAGNOSIS — E28319 Asymptomatic premature menopause: Secondary | ICD-10-CM | POA: Diagnosis not present

## 2019-12-25 NOTE — Progress Notes (Signed)
Gynecology Annual Exam  PCP: Su Ley, MD  Chief Complaint:  Chief Complaint  Patient presents with  . Gynecologic Exam    History of Present Illness: Patient is a 44 y.o. No obstetric history on file. presents for annual exam. The patient has no complaints today.   LMP: No LMP recorded. Patient has had a hysterectomy. No issues with vaginal bleeding or discharge Menarche: 14  She reports that she had a hysterectomy for endometriosis at the age of 48.  She reports that she had a bilateral oophorectomy at that time.  She has not been on estrogen replacement since this hysterectomy because of her family history of breast cancer.  She is also not seen OB/GYN in at least 15 years.  She has a history of 2 deliveries which were complicated by severe preeclampsia.  She reports that she flatlined during one of the deliveries.  She had 1 vaginal delivery in 1996 of a female infant and 1 C-section in 1999 of a female infant.  She is a history of 1 miscarriage.  She reports that she has a remote history of chlamydia as well as history of genital herpes.  She has had frequent outbreaks for herpes in the past and has been taking daily Valtrex for about 2 years which works for suppression for her.  She has a family history significant for breast cancer and a maternal grandmother and maternal aunt.   Her mother had a history of ovarian and uterine cancer.  She is interested in virus testing and would like to pursue that today.  She reports that she has had a mammogram about 15 years ago.  She does have fibrocystic breasts.  She does breast exams and has not noticed any lumps or bumps although it is difficult for her to tell because of her fibrocystic breasts.  She denies any history of fibroids or polyps in the past.  She does have a history of endometriosis.  She reports that she used Depo-Provera as contraception for 7 years prior to having her hysterectomy.  The patient is not currently  sexually active. She currently uses status post hysterectomy for contraception. She denies dyspareunia.  The patient does perform self breast exams.  There is notable family history of breast or ovarian cancer in her family.  The patient denies current symptoms of depression.   PHQ-9 score is 4  GAD-7 score is 3  Review of Systems: ROS  Past Medical History:  Patient Active Problem List   Diagnosis Date Noted  . BACK PAIN 11/19/2010    Qualifier: Diagnosis of  By: Lorelei Pont MD, Frederico Hamman     . INSOMNIA-SLEEP DISORDER-UNSPEC 08/27/2010    Qualifier: Diagnosis of  By: Lorelei Pont MD, Frederico Hamman     . ANXIETY 03/24/2009    Qualifier: Diagnosis of  By: Lorelei Pont MD, Frederico Hamman     . EATING DISORDER, UNSPECIFIED 03/24/2009    Qualifier: Diagnosis of  By: Ellin Mayhew CMA ([place]), Heather     . DEPRESSION 03/24/2009    Qualifier: Diagnosis of  By: Ellin Mayhew CMA ([place]), Heather     . MIGRAINE HEADACHE 03/24/2009    Qualifier: Diagnosis of  By: Ellin Mayhew CMA ([place]), Heather     . GERD 03/24/2009    Qualifier: Diagnosis of  By: Ellin Mayhew CMA ([place]), Heather     . GASTRIC ULCER 03/24/2009    Qualifier: Diagnosis of  By: Ellin Mayhew CMA ([place]), Heather     . FATIGUE 03/24/2009    Qualifier: Diagnosis of  By:  Copland MD, Karleen Hampshire       Past Surgical History:  Past Surgical History:  Procedure Laterality Date  . ABDOMINAL HYSTERECTOMY    . CHOLECYSTECTOMY      Gynecologic History:  No LMP recorded. Patient has had a hysterectomy. Contraception: status post hysterectomy Last Pap: discontinued secondary to hx of hysterectomy without cervical dysplasia Last mammogram: more than 15 years ago Results were: unknown  Obstetric History: No obstetric history on file.  Family History:  History reviewed. No pertinent family history.  Social History:  Social History   Socioeconomic History  . Marital status: Single    Spouse name: Not on file  . Number of children: Not on file  . Years of  education: Not on file  . Highest education level: Not on file  Occupational History  . Not on file  Tobacco Use  . Smoking status: Current Every Day Smoker    Packs/day: 0.50    Types: Cigarettes  . Smokeless tobacco: Never Used  Substance and Sexual Activity  . Alcohol use: Yes  . Drug use: No  . Sexual activity: Yes    Birth control/protection: Surgical  Other Topics Concern  . Not on file  Social History Narrative  . Not on file   Social Determinants of Health   Financial Resource Strain:   . Difficulty of Paying Living Expenses: Not on file  Food Insecurity:   . Worried About Programme researcher, broadcasting/film/video in the Last Year: Not on file  . Ran Out of Food in the Last Year: Not on file  Transportation Needs:   . Lack of Transportation (Medical): Not on file  . Lack of Transportation (Non-Medical): Not on file  Physical Activity:   . Days of Exercise per Week: Not on file  . Minutes of Exercise per Session: Not on file  Stress:   . Feeling of Stress : Not on file  Social Connections:   . Frequency of Communication with Friends and Family: Not on file  . Frequency of Social Gatherings with Friends and Family: Not on file  . Attends Religious Services: Not on file  . Active Member of Clubs or Organizations: Not on file  . Attends Banker Meetings: Not on file  . Marital Status: Not on file  Intimate Partner Violence:   . Fear of Current or Ex-Partner: Not on file  . Emotionally Abused: Not on file  . Physically Abused: Not on file  . Sexually Abused: Not on file    Allergies:  Allergies  Allergen Reactions  . Depakote [Divalproex Sodium] Anaphylaxis  . Lamictal [Lamotrigine] Anaphylaxis  . Topiramate Anaphylaxis and Other (See Comments)    Hair falls out Hair falls out Hair falls out Hair falls out     Medications: Prior to Admission medications   Medication Sig Start Date End Date Taking? Authorizing Provider  albuterol (PROVENTIL HFA;VENTOLIN HFA)  108 (90 BASE) MCG/ACT inhaler Inhale 2 puffs into the lungs every 6 (six) hours as needed. Shortness of breath and wheezing     [provider]  brompheniramine-pseudoephedrine-DM 30-2-10 MG/5ML syrup Take 5 mLs by mouth 4 (four) times daily as needed. 11/23/17   Joni Reining, PA-C  cholecalciferol (VITAMIN D) 1000 UNITS tablet Take 1,000 Units by mouth daily.      [provider]  clonazePAM (KLONOPIN) 1 MG tablet TAKE 1 TABLET BY MOUTH TWICE DAILY Patient taking differently: 1.5 mg tid 02/07/11   Copland, Karleen Hampshire, MD  gabapentin (NEURONTIN) 300 MG  capsule Take 300 mg by mouth at bedtime.      [provider]  ibuprofen (ADVIL,MOTRIN) 600 MG tablet Take 1 tablet (600 mg total) by mouth every 6 (six) hours as needed. 12/10/18   [name], PA-C  levETIRAcetam (KEPPRA) 1000 MG tablet Take 1,000 mg by mouth 2 (two) times daily.     [provider]  naproxen (NAPROSYN) 500 MG tablet Take 1 tablet (500 mg total) by mouth 2 (two) times daily with a meal. 07/22/18   Sharman Cheek, MD  ondansetron (ZOFRAN ODT) 4 MG disintegrating tablet Allow 1-2 tablets to dissolve in your mouth every 8 hours as needed for nausea/vomiting 06/01/17   Loleta Rose, MD  ondansetron (ZOFRAN) 8 MG tablet Take 1 tablet (8 mg total) by mouth every 8 (eight) hours as needed for nausea or vomiting. 11/23/17   Joni Reining, PA-C  vitamin B-12 (CYANOCOBALAMIN) 1000 MCG tablet Take 1,000 mcg by mouth daily.      [provider]    Physical Exam Vitals: There were no vitals taken for this visit.  General: NAD HEENT: normocephalic, anicteric Thyroid: no enlargement, no palpable nodules Pulmonary: No increased work of breathing, CTAB Cardiovascular: RRR, distal pulses 2+ Breast: Breast symmetrical, no tenderness, no palpable nodules or masses, no skin or nipple retraction present, no nipple discharge.  No axillary or supraclavicular lymphadenopathy. Abdomen: NABS, soft,  non-tender, non-distended.  Umbilicus without lesions.  No hepatomegaly, splenomegaly or masses palpable. No evidence of hernia  Genitourinary:  External: Normal external female genitalia.  Normal urethral meatus, normal Bartholin's and Skene's glands.    Vagina: Normal vaginal mucosa, no evidence of prolapse.    Cervix: absent  Uterus: absent  Adnexa: no adnexal masses  Rectal: deferred  Lymphatic: no evidence of inguinal lymphadenopathy Extremities: no edema, erythema, or tenderness Neurologic: Grossly intact Psychiatric: mood appropriate, affect full  Female chaperone present for pelvic and breast  portions of the physical exam    Assessment: 44 y.o. No obstetric history on file. routine annual exam  Plan: Problem List Items Addressed This Visit    None    Visit Diagnoses    Health care maintenance    -  Primary   Cervical cancer screening       Breast cancer screening by mammogram       Relevant Orders   MM DIGITAL SCREENING BILATERAL   Early onset menopause       Relevant Orders   FSH      1) Mammogram - recommend yearly screening mammogram.  Mammogram Was ordered today  2) STI screening  wasoffered and declined  3) ASCCP guidelines and rational discussed.  Patient opts for discontinue secondary to prior hysterectomy screening interval  4) Contraception - the patient is currently using  status post hysterectomy.  She is not currently in need of contraception secondary to being sterile  5) Colonoscopy -- Screening recommended starting at age 62 for average risk individuals, age 69 for individuals deemed at increased risk (including African Americans) and recommended to continue until age 8.  For patient age 4-85 individualized approach is recommended.  Gold standard screening is via colonoscopy, Cologuard screening is an acceptable alternative for patient unwilling or unable to undergo colonoscopy.  "Colorectal cancer screening for average?risk adults: 2018 guideline  update from the American Cancer Society"[place]: A Cancer Journal for Clinicians: Mar 16, 2017   6) Routine healthcare maintenance including cholesterol, diabetes screening discussed managed by PCP  7) History of early surgical menopause.  Patient has not been on estrogen replacement therapy.  Patient has multiple risk factors including 7 years of Depo-Provera use and a 20-year 1 pack/day history of smoking.  She is recently stopped smoking about 1 year ago.  She reports her mother has also had issues with osteoporosis although she has not had a hip fracture.  Will check patient's FSH.  If this is elevated confirming her surgical menopause she would be a good candidate for an early DEXA scan.  Can order on follow-up.  8) Family history of breast and ovarian cancer will obtain labs testing today.  We will follow up to discuss results.  Patient may qualify for annual mammogram and breast MRI.  9) Return in about 1 year (around 12/24/2020) for annual.  Adelene Idler MD [facility], [facility] 12/25/2019 9:43 AM

## 2019-12-25 NOTE — Patient Instructions (Addendum)
Institute of Medicine Recommended Dietary Allowances for Calcium and Vitamin D  Age (yr) Calcium Recommended Dietary Allowance (mg/day) Vitamin D Recommended Dietary Allowance (international units/day)  9-18 1,300 600  19-50 1,000 600  51-70 1,200 600  71 and older 1,200 800  Data from Institute of Medicine. Dietary reference intakes: calcium, vitamin D. [place], [place]: National Academies Press; 2011.     Budget-Friendly Healthy Eating There are many ways to save money at the grocery store and continue to eat healthy. You can be successful if you:  Plan meals according to your budget.  Make a grocery list and only purchase food according to your grocery list.  Prepare food yourself. What are tips for following this plan?  Reading food labels  Compare food labels between brand name foods and the store brand. Often the nutritional value is the same, but the store brand is lower cost.  Look for products that do not have added sugar, fat, or salt (sodium). These often cost the same but are healthier for you. Products may be labeled as: ? Sugar-free. ? Nonfat. ? Low-fat. ? Sodium-free. ? Low-sodium.  Look for lean ground beef labeled as at least 92% lean and 8% fat. Shopping  Buy only the items on your grocery list and go only to the areas of the store that have the items on your list.  Use coupons only for foods and brands you normally buy. Avoid buying items you wouldn't normally buy simply because they are on sale.  Check online and in newspapers for weekly deals.  Buy healthy items from the bulk bins when available, such as herbs, spices, flour, pasta, nuts, and dried fruit.  Buy fruits and vegetables that are in season. Prices are usually lower on in-season produce.  Look at the unit price on the price tag. Use it to compare different brands and sizes to find out which item is the best deal.  Choose healthy items that are often low-cost, such as carrots,  potatoes, apples, bananas, and oranges. Dried or canned beans are a low-cost protein source.  Buy in bulk and freeze extra food. Items you can buy in bulk include meats, fish, poultry, frozen fruits, and frozen vegetables.  Avoid buying "ready-to-eat" foods, such as pre-cut fruits and vegetables and pre-made salads.  If possible, shop around to discover where you can find the best prices. Consider other retailers such as dollar stores, larger wholesale stores, local fruit and vegetable stands, and farmers markets.  Do not shop when you are hungry. If you shop while hungry, it may be hard to stick to your list and budget.  Resist impulse buying. Use your grocery list as your official plan for the week.  Buy a variety of vegetables and fruits by purchasing fresh, frozen, and canned items.  Look at the top and bottom shelves for deals. Foods at eye level (eye level of an adult or child) are usually more expensive.  Be efficient with your time when shopping. The more time you spend at the store, the more money you are likely to spend.  To save money when choosing more expensive foods like meats and dairy: ? Choose cheaper cuts of meat, such as bone-in chicken thighs and drumsticks instead of skinless and boneless chicken. When you are ready to prepare the chicken, you can remove the skin yourself to make it healthier. ? Choose lean meats like chicken or turkey instead of beef. ? Choose canned seafood, such as tuna, salmon, or sardines. ? Buy   eggs as a low-cost source of protein. ? Buy dried beans and peas, such as lentils, split peas, or kidney beans instead of meats. Dried beans and peas are a good alternative source of protein. ? Buy the larger tubs of yogurt instead of individual-sized containers.  Choose water instead of sodas and other sweetened beverages.  Avoid buying chips, cookies, and other "junk food." These items are usually expensive and not healthy. Cooking  Make extra food  and freeze the extras in meal-sized containers or in individual portions for fast meals and snacks.  Pre-cook on days when you have extra time to prepare meals in advance. You can keep these meals in the fridge or freezer and reheat for a quick meal.  When you come home from the grocery store, wash, peel, and cut fruits and vegetables so they are ready to use and eat. This will help reduce food waste. Meal planning  Do not eat out or get fast food. Prepare food at home.  Make a grocery list and make sure to bring it with you to the store. If you have a smart phone, you could use your phone to create your shopping list.  Plan meals and snacks according to a grocery list and budget you create.  Use leftovers in your meal plan for the week.  Look for recipes where you can cook once and make enough food for two meals.  Include budget-friendly meals like stews, casseroles, and stir-fry dishes.  Try some meatless meals or try "no cook" meals like salads.  Make sure that half your plate is filled with fruits or vegetables. Choose from fresh, frozen, or canned fruits and vegetables. If eating canned, remember to rinse them before eating. This will remove any excess salt added for packaging. Summary  Eating healthy on a budget is possible if you plan your meals according to your budget, purchase according to your budget and grocery list, and prepare food yourself.  Tips for buying more food on a limited budget include buying generic brands, using coupons only for foods you normally buy, and buying healthy items from the bulk bins when available.  Tips for buying cheaper food to replace expensive food include choosing cheaper, lean cuts of meat, and buying dried beans and peas. This information is not intended to replace advice given to you by your health care provider. Make sure you discuss any questions you have with your health care provider. Document Revised: 10/05/2017 Document Reviewed:  10/05/2017 Elsevier Patient Education  2020 Elsevier Inc.  Exercising to Stay Healthy To become healthy and stay healthy, it is recommended that you do moderate-intensity and vigorous-intensity exercise. You can tell that you are exercising at a moderate intensity if your heart starts beating faster and you start breathing faster but can still hold a conversation. You can tell that you are exercising at a vigorous intensity if you are breathing much harder and faster and cannot hold a conversation while exercising. Exercising regularly is important. It has many health benefits, such as:  Improving overall fitness, flexibility, and endurance.  Increasing bone density.  Helping with weight control.  Decreasing body fat.  Increasing muscle strength.  Reducing stress and tension.  Improving overall health. How often should I exercise? Choose an activity that you enjoy, and set realistic goals. Your health care provider can help you make an activity plan that works for you. Exercise regularly as told by your health care provider. This may include:  Doing strength training two times a   week, such as: ? Lifting weights. ? Using resistance bands. ? Push-ups. ? Sit-ups. ? Yoga.  Doing a certain intensity of exercise for a given amount of time. Choose from these options: ? A total of 150 minutes of moderate-intensity exercise every week. ? A total of 75 minutes of vigorous-intensity exercise every week. ? A mix of moderate-intensity and vigorous-intensity exercise every week. Children, pregnant women, people who have not exercised regularly, people who are overweight, and older adults may need to talk with a health care provider about what activities are safe to do. If you have a medical condition, be sure to talk with your health care provider before you start a new exercise program. What are some exercise ideas? Moderate-intensity exercise ideas include:  Walking 1 mile (1.6 km) in  about 15 minutes.  Biking.  Hiking.  Golfing.  Dancing.  Water aerobics. Vigorous-intensity exercise ideas include:  Walking 4.5 miles (7.2 km) or more in about 1 hour.  Jogging or running 5 miles (8 km) in about 1 hour.  Biking 10 miles (16.1 km) or more in about 1 hour.  Lap swimming.  Roller-skating or in-line skating.  Cross-country skiing.  Vigorous competitive sports, such as football, basketball, and soccer.  Jumping rope.  Aerobic dancing. What are some everyday activities that can help me to get exercise?  Yard work, such as: ? Pushing a lawn mower. ? Raking and bagging leaves.  Washing your car.  Pushing a stroller.  Shoveling snow.  Gardening.  Washing windows or floors. How can I be more active in my day-to-day activities?  Use stairs instead of an elevator.  Take a walk during your lunch break.  If you drive, park your car farther away from your work or school.  If you take public transportation, get off one stop early and walk the rest of the way.  Stand up or walk around during all of your indoor phone calls.  Get up, stretch, and walk around every 30 minutes throughout the day.  Enjoy exercise with a friend. Support to continue exercising will help you keep a regular routine of activity. What guidelines can I follow while exercising?  Before you start a new exercise program, talk with your health care provider.  Do not exercise so much that you hurt yourself, feel dizzy, or get very short of breath.  Wear comfortable clothes and wear shoes with good support.  Drink plenty of water while you exercise to prevent dehydration or heat stroke.  Work out until your breathing and your heartbeat get faster. Where to find more information  [place] Department of Health and Human Services: www.hhs.gov  Centers for Disease Control and Prevention (CDC): www.cdc.gov Summary  Exercising regularly is important. It will improve your overall  fitness, flexibility, and endurance.  Regular exercise also will improve your overall health. It can help you control your weight, reduce stress, and improve your bone density.  Do not exercise so much that you hurt yourself, feel dizzy, or get very short of breath.  Before you start a new exercise program, talk with your health care provider. This information is not intended to replace advice given to you by your health care provider. Make sure you discuss any questions you have with your health care provider. Document Revised: 09/16/2017 Document Reviewed: 08/25/2017 Elsevier Patient Education  2020 Elsevier Inc.  

## 2019-12-26 ENCOUNTER — Ambulatory Visit: Payer: Self-pay | Admitting: Orthopedic Surgery

## 2019-12-26 LAB — FOLLICLE STIMULATING HORMONE: FSH: 65.5 m[IU]/mL

## 2020-01-10 ENCOUNTER — Encounter: Payer: Self-pay | Admitting: Obstetrics and Gynecology

## 2020-01-29 ENCOUNTER — Other Ambulatory Visit: Payer: Self-pay | Admitting: Obstetrics and Gynecology

## 2020-01-29 DIAGNOSIS — Z1231 Encounter for screening mammogram for malignant neoplasm of breast: Secondary | ICD-10-CM

## 2020-01-30 ENCOUNTER — Telehealth: Payer: Self-pay

## 2020-01-30 ENCOUNTER — Ambulatory Visit
Admission: RE | Admit: 2020-01-30 | Discharge: 2020-01-30 | Disposition: A | Discharge status: Home / Self Care | Payer: 59 | Source: Ambulatory Visit

## 2020-01-30 ENCOUNTER — Other Ambulatory Visit: Payer: Self-pay

## 2020-01-30 DIAGNOSIS — Z1231 Encounter for screening mammogram for malignant neoplasm of breast: Secondary | ICD-10-CM

## 2020-01-30 NOTE — Progress Notes (Signed)
Discussed mammogram result with patient. She is awaiting calll from radiology to schedule [place].

## 2020-01-30 NOTE — Telephone Encounter (Signed)
Returned call

## 2020-01-30 NOTE — Telephone Encounter (Signed)
Pt calling triage needing a call from Dr Jerene Pitch, Her mammo results are back and she wants to know what the next step is.

## 2020-01-31 ENCOUNTER — Other Ambulatory Visit: Payer: Self-pay | Admitting: Obstetrics and Gynecology

## 2020-01-31 DIAGNOSIS — R928 Other abnormal and inconclusive findings on diagnostic imaging of breast: Secondary | ICD-10-CM

## 2020-02-05 ENCOUNTER — Ambulatory Visit: Payer: 59 | Admitting: Orthopaedic Surgery

## 2020-02-05 ENCOUNTER — Other Ambulatory Visit: Payer: Self-pay | Admitting: Sports Medicine

## 2020-02-05 DIAGNOSIS — M25562 Pain in left knee: Secondary | ICD-10-CM

## 2020-02-05 DIAGNOSIS — M25462 Effusion, left knee: Secondary | ICD-10-CM

## 2020-02-05 DIAGNOSIS — M2242 Chondromalacia patellae, left knee: Secondary | ICD-10-CM

## 2020-02-05 DIAGNOSIS — G8929 Other chronic pain: Secondary | ICD-10-CM

## 2020-02-11 ENCOUNTER — Other Ambulatory Visit: Payer: Self-pay

## 2020-02-11 ENCOUNTER — Ambulatory Visit
Admission: RE | Admit: 2020-02-11 | Discharge: 2020-02-11 | Disposition: A | Discharge status: Home / Self Care | Payer: 59 | Source: Ambulatory Visit | Attending: Obstetrics and Gynecology | Admitting: Obstetrics and Gynecology

## 2020-02-11 ENCOUNTER — Ambulatory Visit (INDEPENDENT_AMBULATORY_CARE_PROVIDER_SITE_OTHER): Payer: 59 | Admitting: Obstetrics and Gynecology

## 2020-02-11 ENCOUNTER — Encounter: Payer: Self-pay | Admitting: Obstetrics and Gynecology

## 2020-02-11 VITALS — BP 120/70 | Ht 64.0 in | Wt 163.0 lb

## 2020-02-11 DIAGNOSIS — Z1239 Encounter for other screening for malignant neoplasm of breast: Secondary | ICD-10-CM

## 2020-02-11 DIAGNOSIS — R928 Other abnormal and inconclusive findings on diagnostic imaging of breast: Secondary | ICD-10-CM

## 2020-02-11 DIAGNOSIS — Z9189 Other specified personal risk factors, not elsewhere classified: Secondary | ICD-10-CM

## 2020-02-11 NOTE — Progress Notes (Signed)
Patient ID: Andrea Hood, female   DOB: 1976/03/03, 44 y.o.   MRN: [id number]  Reason for Consult: Results   Referred by Su Ley, MD  Subjective:     HPI:  Andrea Hood is a 44 y.o. female   Past Medical History:  Diagnosis Date  . Asthma   . BRCA negative 12/2019   MyRisk neg; IBIS=13.4%/riskscore=25.3%  . Family history of breast cancer   . Family history of ovarian cancer   . Gastric ulcer   . Migraines   . Pituitary cyst ([place])   . Seizures ([place])    Family History  Problem Relation Age of Onset  . Cervical cancer Mother 77  . Ovarian cancer Mother 27  . Breast cancer Maternal Grandmother 40  . Breast cancer Maternal Aunt 35   Past Surgical History:  Procedure Laterality Date  . ABDOMINAL HYSTERECTOMY    . CHOLECYSTECTOMY      Short Social History:  Social History   Tobacco Use  . Smoking status: Current Every Day Smoker    Packs/day: 0.50    Types: Cigarettes  . Smokeless tobacco: Never Used  Substance Use Topics  . Alcohol use: Yes    Allergies  Allergen Reactions  . Depakote [Divalproex Sodium] Anaphylaxis  . Lamictal [Lamotrigine] Anaphylaxis  . Topiramate Anaphylaxis and Other (See Comments)    Hair falls out Hair falls out Hair falls out Hair falls out     Current Outpatient Medications  Medication Sig Dispense Refill  . cholecalciferol (VITAMIN D) 1000 UNITS tablet Take 1,000 Units by mouth daily.      . clonazePAM (KLONOPIN) 1 MG tablet TAKE 1 TABLET BY MOUTH TWICE DAILY (Patient taking differently: 1.5 mg tid) 60 tablet 0  . gabapentin (NEURONTIN) 100 MG capsule Take 200 mg by mouth 3 (three) times daily.    . valACYclovir (VALTREX) 500 MG tablet Take by mouth.    . vitamin B-12 (CYANOCOBALAMIN) 1000 MCG tablet Take 1,000 mcg by mouth daily.      Marland Kitchen albuterol (PROVENTIL HFA;VENTOLIN HFA) 108 (90 BASE) MCG/ACT inhaler Inhale 2 puffs into the lungs every 6 (six) hours as needed. Shortness of breath and wheezing     . ibuprofen  (ADVIL,MOTRIN) 600 MG tablet Take 1 tablet (600 mg total) by mouth every 6 (six) hours as needed. 30 tablet 0  . naproxen (NAPROSYN) 500 MG tablet Take 1 tablet (500 mg total) by mouth 2 (two) times daily with a meal. 20 tablet 0  . ondansetron (ZOFRAN ODT) 4 MG disintegrating tablet Allow 1-2 tablets to dissolve in your mouth every 8 hours as needed for nausea/vomiting 30 tablet 0  . phentermine 37.5 MG capsule Take 37.5 mg by mouth every morning.    Marland Kitchen Spacer/Aero-Holding Chambers (AEROCHAMBER MV) inhaler by Does not apply route.    . traMADol (ULTRAM) 50 MG tablet Take 100 mg by mouth every 8 (eight) hours as needed.     No current facility-administered medications for this visit.    REVIEW OF SYSTEMS      Objective:  Objective   Vitals:   02/11/20 0820  BP: 120/70  Weight: 163 lb (73.9 kg)  Height: '5\' 4"'  (1.626 m)   Body mass index is 27.98 kg/m.  Physical Exam   Assessment/Plan:     44 yo Patient having imaging today for right breast mass Discussed elevated family risk of breast cancer and screening methids for the futures.  Patient is interested in risk reduction strategies for breast  cancer. Once she has completed imaging will follow up to discuss this further if needed.     Adrian Prows MD [facility], [facility] 02/11/2020 8:38 AM

## 2020-02-16 ENCOUNTER — Ambulatory Visit
Admission: RE | Admit: 2020-02-16 | Discharge: 2020-02-16 | Disposition: A | Discharge status: Home / Self Care | Payer: 59 | Source: Ambulatory Visit | Attending: Sports Medicine | Admitting: Sports Medicine

## 2020-02-16 DIAGNOSIS — M2242 Chondromalacia patellae, left knee: Secondary | ICD-10-CM | POA: Diagnosis present

## 2020-02-16 DIAGNOSIS — M25562 Pain in left knee: Secondary | ICD-10-CM | POA: Diagnosis present

## 2020-02-16 DIAGNOSIS — M25462 Effusion, left knee: Secondary | ICD-10-CM | POA: Diagnosis present

## 2020-02-16 DIAGNOSIS — G8929 Other chronic pain: Secondary | ICD-10-CM | POA: Diagnosis present

## 2020-02-19 ENCOUNTER — Other Ambulatory Visit: Payer: 59

## 2020-02-19 ENCOUNTER — Telehealth: Payer: Self-pay

## 2020-02-19 NOTE — Telephone Encounter (Signed)
Spoke w/patient. Advised to contact Imaging directly to inquire about this. I reviewed Epic. I see apt canceled, but also apt open for today. However, under appts, there is no appt showing. Will send to [facility] who would have set up apt.

## 2020-02-19 NOTE — Telephone Encounter (Signed)
Patient reports she had an apt for Breast MRI today in [place]. She was checking her my chart last night and received a notification that the apt was cancelled by provider. She is inquiring about this. TD#[phone]

## 2020-03-25 ENCOUNTER — Other Ambulatory Visit: Payer: Self-pay | Admitting: Obstetrics and Gynecology

## 2020-03-25 DIAGNOSIS — B009 Herpesviral infection, unspecified: Secondary | ICD-10-CM

## 2020-03-25 MED ORDER — VALACYCLOVIR HCL 500 MG PO TABS: 500.0000 mg | ORAL_TABLET | Freq: Every day | ORAL | 3 refills | Status: DC

## 2020-04-17 ENCOUNTER — Other Ambulatory Visit: Payer: Self-pay | Admitting: Student

## 2020-04-17 DIAGNOSIS — Z8711 Personal history of peptic ulcer disease: Secondary | ICD-10-CM

## 2020-04-17 DIAGNOSIS — K2970 Gastritis, unspecified, without bleeding: Secondary | ICD-10-CM

## 2020-04-17 DIAGNOSIS — R1031 Right lower quadrant pain: Secondary | ICD-10-CM

## 2020-04-17 DIAGNOSIS — R1013 Epigastric pain: Secondary | ICD-10-CM

## 2020-04-24 ENCOUNTER — Other Ambulatory Visit: Payer: Self-pay

## 2020-04-24 ENCOUNTER — Ambulatory Visit
Admission: RE | Admit: 2020-04-24 | Discharge: 2020-04-24 | Disposition: A | Discharge status: Home / Self Care | Payer: 59 | Source: Ambulatory Visit | Attending: Student | Admitting: Student

## 2020-04-24 DIAGNOSIS — R1013 Epigastric pain: Secondary | ICD-10-CM

## 2020-04-24 DIAGNOSIS — K2970 Gastritis, unspecified, without bleeding: Secondary | ICD-10-CM | POA: Diagnosis present

## 2020-04-24 DIAGNOSIS — Z8711 Personal history of peptic ulcer disease: Secondary | ICD-10-CM | POA: Diagnosis present

## 2020-04-24 DIAGNOSIS — R1031 Right lower quadrant pain: Secondary | ICD-10-CM | POA: Diagnosis present

## 2020-04-24 MED ORDER — IOHEXOL 300 MG/ML  SOLN
100.0000 mL | Freq: Once | INTRAMUSCULAR | Status: AC | PRN
  Administered 2020-04-24: 100 mL via INTRAVENOUS

## 2020-06-25 ENCOUNTER — Other Ambulatory Visit: Payer: Self-pay | Admitting: Student

## 2020-06-25 DIAGNOSIS — R110 Nausea: Secondary | ICD-10-CM

## 2020-06-25 DIAGNOSIS — R1011 Right upper quadrant pain: Secondary | ICD-10-CM

## 2020-08-25 ENCOUNTER — Other Ambulatory Visit: Payer: Self-pay

## 2020-08-25 ENCOUNTER — Ambulatory Visit
Admission: RE | Admit: 2020-08-25 | Discharge: 2020-08-25 | Disposition: A | Discharge status: Home / Self Care | Payer: 59 | Source: Ambulatory Visit | Attending: Obstetrics and Gynecology | Admitting: Obstetrics and Gynecology

## 2020-08-25 DIAGNOSIS — Z9189 Other specified personal risk factors, not elsewhere classified: Secondary | ICD-10-CM

## 2020-08-25 DIAGNOSIS — Z1239 Encounter for other screening for malignant neoplasm of breast: Secondary | ICD-10-CM

## 2020-08-25 MED ORDER — GADOBUTROL 1 MMOL/ML IV SOLN
7.0000 mL | Freq: Once | INTRAVENOUS | Status: AC | PRN
  Administered 2020-08-25: 7 mL via INTRAVENOUS

## 2020-10-18 ENCOUNTER — Emergency Department: Payer: 59

## 2020-10-18 ENCOUNTER — Encounter: Payer: Self-pay | Admitting: Emergency Medicine

## 2020-10-18 ENCOUNTER — Emergency Department
Admission: EM | Admit: 2020-10-18 | Discharge: 2020-10-18 | Disposition: A | Discharge status: Home / Self Care | Payer: 59 | Attending: Emergency Medicine | Admitting: Emergency Medicine

## 2020-10-18 ENCOUNTER — Other Ambulatory Visit: Payer: Self-pay

## 2020-10-18 DIAGNOSIS — S4991XA Unspecified injury of right shoulder and upper arm, initial encounter: Secondary | ICD-10-CM | POA: Diagnosis present

## 2020-10-18 DIAGNOSIS — M79601 Pain in right arm: Secondary | ICD-10-CM | POA: Insufficient documentation

## 2020-10-18 DIAGNOSIS — S42034A Nondisplaced fracture of lateral end of right clavicle, initial encounter for closed fracture: Secondary | ICD-10-CM

## 2020-10-18 DIAGNOSIS — F17210 Nicotine dependence, cigarettes, uncomplicated: Secondary | ICD-10-CM | POA: Diagnosis not present

## 2020-10-18 DIAGNOSIS — J45909 Unspecified asthma, uncomplicated: Secondary | ICD-10-CM | POA: Diagnosis not present

## 2020-10-18 DIAGNOSIS — W133XXA Fall through floor, initial encounter: Secondary | ICD-10-CM | POA: Insufficient documentation

## 2020-10-18 MED ORDER — OXYCODONE-ACETAMINOPHEN 10-325 MG PO TABS: 1.0000 | ORAL_TABLET | ORAL | 0 refills | Status: DC | PRN

## 2020-10-18 MED ORDER — NAPROXEN 500 MG PO TABS: 500.0000 mg | ORAL_TABLET | Freq: Two times a day (BID) | ORAL | 0 refills | Status: DC

## 2020-10-18 MED ORDER — OXYCODONE HCL 5 MG PO TABS
5.0000 mg | ORAL_TABLET | Freq: Once | ORAL | Status: AC
  Administered 2020-10-18: 5 mg via ORAL
  Filled 2020-10-18: qty 1

## 2020-10-18 MED ORDER — METHOCARBAMOL 500 MG PO TABS: 500.0000 mg | ORAL_TABLET | Freq: Four times a day (QID) | ORAL | 0 refills | Status: DC | PRN

## 2020-10-18 MED ORDER — METHOCARBAMOL 500 MG PO TABS
750.0000 mg | ORAL_TABLET | Freq: Once | ORAL | Status: AC
  Administered 2020-10-18: 750 mg via ORAL
  Filled 2020-10-18: qty 2

## 2020-10-18 MED ORDER — KETOROLAC TROMETHAMINE 60 MG/2ML IM SOLN
30.0000 mg | Freq: Once | INTRAMUSCULAR | Status: AC
  Administered 2020-10-18: 30 mg via INTRAMUSCULAR
  Filled 2020-10-18: qty 2

## 2020-10-18 NOTE — ED Triage Notes (Signed)
Pt arrived via POV with reports of R shoulder injury and clavicle pain x 3 days. Pt has arm in sling at this time. Pt is unable to move arm without having what she describes as excruciating pain.  Pt taking tramadol at home for pain.

## 2020-10-18 NOTE — ED Provider Notes (Signed)
[facility] Emergency Department Provider Note ____________________________________________  Time seen: Approximately 5:40 PM  I have reviewed the triage vital signs and the nursing notes.   HISTORY  Chief Complaint Arm Pain    HPI Andrea Hood is a 45 y.o. female who presents to the emergency department for evaluation and treatment of right shoulder pain.  Patient states that 3 days ago her alarm on her Alexa was going off.  She states that she tried to tell it to stop but it continued.  She got out of bed and believes that she had a seizure which caused her to fall into the floor and landed on her right shoulder.  She does have a history of seizures and states that it is not uncommon for her to have a seizure especially if she is stressed or upset.  Since the fall, she has had difficulty moving the right shoulder.  She has applied ice, heat, muscle rub creams, and has been using a sling.  Pain is not improving with any of the above measures.   Past Medical History:  Diagnosis Date  . Asthma   . BRCA negative 12/2019   MyRisk neg; IBIS=13.4%/riskscore=25.3%  . Family history of breast cancer   . Family history of ovarian cancer   . Gastric ulcer   . Migraines   . Pituitary cyst ([place])   . Seizures [facility])     Patient Active Problem List   Diagnosis Date Noted  . BACK PAIN 11/19/2010  . INSOMNIA-SLEEP DISORDER-UNSPEC 08/27/2010  . ANXIETY 03/24/2009  . EATING DISORDER, UNSPECIFIED 03/24/2009  . DEPRESSION 03/24/2009  . MIGRAINE HEADACHE 03/24/2009  . GERD 03/24/2009  . GASTRIC ULCER 03/24/2009  . FATIGUE 03/24/2009    Past Surgical History:  Procedure Laterality Date  . ABDOMINAL HYSTERECTOMY    . CHOLECYSTECTOMY      Prior to Admission medications   Medication Sig Start Date End Date Taking? Authorizing Provider  methocarbamol (ROBAXIN) 500 MG tablet Take 1 tablet (500 mg total) by mouth every 6 (six) hours as needed for muscle spasms. 10/18/20   Yes Fotios Amos B, FNP  naproxen (NAPROSYN) 500 MG tablet Take 1 tablet (500 mg total) by mouth 2 (two) times daily with a meal. 10/18/20  Yes Sheniqua Carolan B, FNP  oxyCODONE-acetaminophen (PERCOCET) 10-325 MG tablet Take 1 tablet by mouth every 4 (four) hours as needed for pain. 10/18/20  Yes Cale Bethard B, FNP  albuterol (PROVENTIL HFA;VENTOLIN HFA) 108 (90 BASE) MCG/ACT inhaler Inhale 2 puffs into the lungs every 6 (six) hours as needed. Shortness of breath and wheezing     [provider]  cholecalciferol (VITAMIN D) 1000 UNITS tablet Take 1,000 Units by mouth daily.      [provider]  clonazePAM (KLONOPIN) 1 MG tablet TAKE 1 TABLET BY MOUTH TWICE DAILY Patient taking differently: 1.5 mg tid 02/07/11   Copland, Frederico Hamman, MD  gabapentin (NEURONTIN) 100 MG capsule Take 200 mg by mouth 3 (three) times daily. 10/14/19   [provider]  ibuprofen (ADVIL,MOTRIN) 600 MG tablet Take 1 tablet (600 mg total) by mouth every 6 (six) hours as needed. 12/10/18   Laban Emperor, PA-C  ondansetron (ZOFRAN ODT) 4 MG disintegrating tablet Allow 1-2 tablets to dissolve in your mouth every 8 hours as needed for nausea/vomiting 06/01/17   Hinda Kehr, MD  phentermine 37.5 MG capsule Take 37.5 mg by mouth every morning. 12/08/19   [provider]  Spacer/Aero-Holding Chambers (AEROCHAMBER MV) inhaler by  Does not apply route. 02/19/15   [provider]  traMADol (ULTRAM) 50 MG tablet Take 100 mg by mouth every 8 (eight) hours as needed. 12/13/19   [provider]  valACYclovir (VALTREX) 500 MG tablet Take 1 tablet (500 mg total) by mouth daily. 03/25/20   Schuman, Stefanie Libel, MD  vitamin B-12 (CYANOCOBALAMIN) 1000 MCG tablet Take 1,000 mcg by mouth daily.      [provider]    Allergies Depakote [divalproex sodium], Lamictal [lamotrigine], and Topiramate  Family History  Problem Relation Age of Onset  . Cervical cancer Mother 17  . Ovarian cancer  Mother 80  . Breast cancer Maternal Grandmother 7  . Breast cancer Maternal Aunt 72    Social History Social History   Tobacco Use  . Smoking status: Current Every Day Smoker    Packs/day: 0.50    Types: Cigarettes  . Smokeless tobacco: Never Used  Vaping Use  . Vaping Use: Never used  Substance Use Topics  . Alcohol use: Yes  . Drug use: No    Review of Systems Constitutional: Negative for fever. Cardiovascular: Negative for chest pain. Respiratory: Negative for shortness of breath. Musculoskeletal: Positive for right shoulder pain Skin: Positive for contusions Neurological: Negative for decrease in sensation  ____________________________________________   PHYSICAL EXAM:  VITAL SIGNS: ED Triage Vitals  Enc Vitals Group     BP 10/18/20 1443 102/61     Pulse Rate 10/18/20 1443 (!) 108     Resp 10/18/20 1443 18     Temp 10/18/20 1443 98.6 F (37 C)     Temp Source 10/18/20 1443 Oral     SpO2 10/18/20 1443 99 %     Weight 10/18/20 1441 151 lb (68.5 kg)     Height 10/18/20 1441 '5\' 4"'  (1.626 m)     Head Circumference --      Peak Flow --      Pain Score 10/18/20 1441 10     Pain Loc --      Pain Edu? --      Excl. in [place]? --     Constitutional: Alert and oriented. Well appearing and in no acute distress. Eyes: Conjunctivae are clear without discharge or drainage Head: Atraumatic Neck: Supple. Respiratory: No cough. Respirations are even and unlabored. Musculoskeletal: Tenderness over the superior aspect of the right shoulder.  Tenderness over the lateral aspect of the right clavicle.  Tenderness over the proximal humerus. Neurologic: Motor and sensory function is intact. Skin: Staged ecchymosis noted over the superior aspect of the right shoulder. Psychiatric: Affect and behavior are appropriate.  ____________________________________________   LABS (all labs ordered are listed, but only abnormal results are displayed)  Labs Reviewed - No data to  display ____________________________________________  RADIOLOGY  Distal right clavicle fracture and slight widening of the [facility] joint.[name], personally viewed and evaluated these images (plain radiographs) as part of my medical decision making, as well as reviewing the written report by the radiologist.  DG Shoulder Right  Result Date: 10/18/2020 CLINICAL DATA:  Right shoulder pain and swelling for 3 days after injury. Decreased range of motion. EXAM: RIGHT SHOULDER - 2+ VIEW COMPARISON:  None. FINDINGS: Cortical irregularity along the distal right clavicle suggesting a nondisplaced possibly impacted fracture. Coracoclavicular and acromioclavicular spaces are maintained. No dislocation of the shoulder. Glenohumeral joint appears intact. IMPRESSION: Cortical irregularity along the distal right clavicle suggesting nondisplaced possibly impacted fracture. Electronically Signed   By: Oren Beckmann.D.  On: 10/18/2020 15:24   ____________________________________________   PROCEDURES  Procedures  ____________________________________________   INITIAL IMPRESSION / ASSESSMENT AND PLAN / ED COURSE  Andrea Hood is a 45 y.o. who presents to the emergency department for treatment and evaluation and after falling 3 days ago.  See HPI for further details.  Injury and exam are consistent with clavicle fracture and mild AC joint separation.  Patient declines work-up for seizure activity as this is not an uncommon occurrence for her.  She will be placed in a more supportive sling and given prescriptions for Percocet, methocarbamol, and Naprosyn.   Patient instructed to follow-up with orthopedics.  She was also instructed to return to the emergency department for symptoms that change or worsen if unable schedule an appointment with orthopedics or primary care.  Medications  oxyCODONE (Oxy IR/ROXICODONE) immediate release tablet 5 mg (5 mg Oral Given 10/18/20 1806)  methocarbamol  (ROBAXIN) tablet 750 mg (750 mg Oral Given 10/18/20 1806)  ketorolac (TORADOL) injection 30 mg (30 mg Intramuscular Given 10/18/20 1806)    Pertinent labs & imaging results that were available during my care of the patient were reviewed by me and considered in my medical decision making (see chart for details).   _________________________________________   FINAL CLINICAL IMPRESSION(S) / ED DIAGNOSES  Final diagnoses:  Closed nondisplaced fracture of acromial end of right clavicle, initial encounter    ED Discharge Orders         Ordered    oxyCODONE-acetaminophen (PERCOCET) 10-325 MG tablet  Every 4 hours PRN        10/18/20 1745    methocarbamol (ROBAXIN) 500 MG tablet  Every 6 hours PRN        10/18/20 1745    naproxen (NAPROSYN) 500 MG tablet  2 times daily with meals        10/18/20 1745           If controlled substance prescribed during this visit, 12 month history viewed on the [place] prior to issuing an initial prescription for Schedule II or III opiod.   Victorino Dike, FNP 10/18/20 1959    Lucrezia Starch, MD 10/18/20 2347

## 2020-12-08 ENCOUNTER — Other Ambulatory Visit: Payer: Self-pay

## 2020-12-08 ENCOUNTER — Emergency Department
Admission: EM | Admit: 2020-12-08 | Discharge: 2020-12-08 | Disposition: A | Discharge status: Home / Self Care | Payer: 59 | Attending: Emergency Medicine | Admitting: Emergency Medicine

## 2020-12-08 ENCOUNTER — Encounter: Payer: Self-pay | Admitting: Intensive Care

## 2020-12-08 ENCOUNTER — Emergency Department: Payer: 59

## 2020-12-08 DIAGNOSIS — Z87891 Personal history of nicotine dependence: Secondary | ICD-10-CM | POA: Insufficient documentation

## 2020-12-08 DIAGNOSIS — J45909 Unspecified asthma, uncomplicated: Secondary | ICD-10-CM | POA: Insufficient documentation

## 2020-12-08 DIAGNOSIS — X58XXXD Exposure to other specified factors, subsequent encounter: Secondary | ICD-10-CM | POA: Diagnosis not present

## 2020-12-08 DIAGNOSIS — S42034D Nondisplaced fracture of lateral end of right clavicle, subsequent encounter for fracture with routine healing: Secondary | ICD-10-CM | POA: Diagnosis not present

## 2020-12-08 DIAGNOSIS — S42001D Fracture of unspecified part of right clavicle, subsequent encounter for fracture with routine healing: Secondary | ICD-10-CM

## 2020-12-08 HISTORY — DX: Depression, unspecified: F32.A

## 2020-12-08 HISTORY — DX: Fatty (change of) liver, not elsewhere classified: K76.0

## 2020-12-08 HISTORY — DX: Bipolar II disorder: F31.81

## 2020-12-08 MED ORDER — OXYCODONE-ACETAMINOPHEN 5-325 MG PO TABS: 1.0000 | ORAL_TABLET | ORAL | 0 refills | Status: DC | PRN

## 2020-12-08 MED ORDER — OXYCODONE-ACETAMINOPHEN 5-325 MG PO TABS
1.0000 | ORAL_TABLET | Freq: Once | ORAL | Status: AC
  Administered 2020-12-08: 1 via ORAL
  Filled 2020-12-08: qty 1

## 2020-12-08 MED ORDER — BACLOFEN 10 MG PO TABS: 10.0000 mg | ORAL_TABLET | Freq: Every day | ORAL | 1 refills | Status: AC

## 2020-12-08 NOTE — Discharge Instructions (Addendum)
Follow-up with either [facility], [facility], or your regular orthopedist in [place]. Take the muscle relaxer as prescribed Use the narcotic pain medication sparingly, it can cause addiction problems You may want to double one of your doses of gabapentin to see if it helps with the radiculopathy/nerve pain Return to the emergency department worsening

## 2020-12-08 NOTE — ED Triage Notes (Signed)
First Nurse Note:  Arrives with C/O known broken right shoulder with pain.  Patient is AAOx3.  Skin warm and dry.  Wearing sling to right arm.

## 2020-12-08 NOTE — ED Notes (Addendum)
See triage note  Presents with cont'd right shoulder pain   States injury was in Jan 1st    States she has seen the ortho MD and is currently going to PT  States pain is not any better  Arm remains in sling  Good pulses

## 2020-12-08 NOTE — ED Provider Notes (Signed)
[facility] Emergency Department Provider Note  ____________________________________________   Event Date/Time   First MD Initiated Contact with Patient 12/08/20 1559     (approximate)  I have reviewed the triage vital signs and the nursing notes.   HISTORY  Chief Complaint Shoulder Pain    HPI STEFFI NOVIELLO is a 45 y.o. female presents emergency department complaining of continued right shoulder pain.  Patient has history of clavicle fracture.  She was seen by [facility] orthopedics.  Was put in physical therapy.  States that the area is not healing and she has had increased pain with numbness and tingling into the right hand.  She would like to get a second opinion.  States she has a appointment with her doctor next week but her pain is so bad at this time she does not want to wait.   Patient also mentions a scapula fracture.  Chart review shows mention of scapular pain from wearing the sling but no scapular fracture   Past Medical History:  Diagnosis Date  . Asthma   . Bipolar 2 disorder ([place])   . BRCA negative 12/2019   MyRisk neg; IBIS=13.4%/riskscore=25.3%  . Depression   . Family history of breast cancer   . Family history of ovarian cancer   . Fatty liver   . Gastric ulcer   . Migraines   . Pituitary cyst ([place])   . Seizures [facility])     Patient Active Problem List   Diagnosis Date Noted  . BACK PAIN 11/19/2010  . INSOMNIA-SLEEP DISORDER-UNSPEC 08/27/2010  . ANXIETY 03/24/2009  . EATING DISORDER, UNSPECIFIED 03/24/2009  . DEPRESSION 03/24/2009  . MIGRAINE HEADACHE 03/24/2009  . GERD 03/24/2009  . GASTRIC ULCER 03/24/2009  . FATIGUE 03/24/2009    Past Surgical History:  Procedure Laterality Date  . ABDOMINAL HYSTERECTOMY    . CHOLECYSTECTOMY      Prior to Admission medications   Medication Sig Start Date End Date Taking? Authorizing Provider  baclofen (LIORESAL) 10 MG tablet Take 1 tablet (10 mg total) by mouth daily.  12/08/20 12/08/21 Yes Aeden Matranga, Linden Dolin, PA-C  oxyCODONE-acetaminophen (PERCOCET) 5-325 MG tablet Take 1 tablet by mouth every 4 (four) hours as needed for severe pain. 12/08/20 12/08/21 Yes Dhanush Jokerst, Linden Dolin, PA-C  albuterol (PROVENTIL HFA;VENTOLIN HFA) 108 (90 BASE) MCG/ACT inhaler Inhale 2 puffs into the lungs every 6 (six) hours as needed. Shortness of breath and wheezing     [provider]  cholecalciferol (VITAMIN D) 1000 UNITS tablet Take 1,000 Units by mouth daily.      [provider]  clonazePAM (KLONOPIN) 1 MG tablet TAKE 1 TABLET BY MOUTH TWICE DAILY Patient taking differently: 1.5 mg tid 02/07/11   Copland, Frederico Hamman, MD  gabapentin (NEURONTIN) 100 MG capsule Take 200 mg by mouth 3 (three) times daily. 10/14/19   [provider]  phentermine 37.5 MG capsule Take 37.5 mg by mouth every morning. 12/08/19   [provider]  Spacer/Aero-Holding Chambers (AEROCHAMBER MV) inhaler by Does not apply route. 02/19/15   [provider]  valACYclovir (VALTREX) 500 MG tablet Take 1 tablet (500 mg total) by mouth daily. 03/25/20   Hood, Andrea Libel, MD  vitamin B-12 (CYANOCOBALAMIN) 1000 MCG tablet Take 1,000 mcg by mouth daily.      [provider]    Allergies Depakote [divalproex sodium], Lamictal [lamotrigine], and Topiramate  Family History  Problem Relation Age of Onset  . Cervical cancer Mother 47  . Ovarian cancer Mother  26  . Breast cancer Maternal Grandmother 70  . Breast cancer Maternal Aunt 33    Social History Social History   Tobacco Use  . Smoking status: Former Smoker    Packs/day: 0.50    Types: Cigarettes  . Smokeless tobacco: Never Used  Vaping Use  . Vaping Use: Never used  Substance Use Topics  . Alcohol use: Not Currently  . Drug use: No    Review of Systems  Constitutional: No fever/chills Eyes: No visual changes. ENT: No sore throat. Respiratory: Denies cough Cardiovascular: Denies chest  pain Gastrointestinal: Denies abdominal pain Genitourinary: Negative for dysuria. Musculoskeletal: Negative for back pain.  Right shoulder pain Skin: Negative for rash. Psychiatric: no mood changes,     ____________________________________________   PHYSICAL EXAM:  VITAL SIGNS: ED Triage Vitals  Enc Vitals Group     BP 12/08/20 1533 114/82     Pulse Rate 12/08/20 1533 94     Resp 12/08/20 1533 16     Temp 12/08/20 1533 98.3 F (36.8 C)     Temp Source 12/08/20 1533 Oral     SpO2 12/08/20 1533 100 %     Weight 12/08/20 1534 155 lb (70.3 kg)     Height 12/08/20 1534 '5\' 3"'  (1.6 m)     Head Circumference --      Peak Flow --      Pain Score 12/08/20 1534 10     Pain Loc --      Pain Edu? --      Excl. in [place]? --     Constitutional: Alert and oriented. Well appearing and in no acute distress. Eyes: Conjunctivae are normal.  Head: Atraumatic. Nose: No congestion/rhinnorhea. Mouth/Throat: Mucous membranes are moist.   Neck:  supple no lymphadenopathy noted Cardiovascular: Normal rate, regular rhythm.  Respiratory: Normal respiratory effort.  No retractions, GU: deferred Musculoskeletal: Right arm is in a sling, right shoulder is tender along the clavicle, scapula, trapezius muscle, C-spine is tender also neurologic:  Normal speech and language.  Skin:  Skin is warm, dry and intact. No rash noted. Psychiatric: Mood and affect are normal. Speech and behavior are normal.  ____________________________________________   LABS (all labs ordered are listed, but only abnormal results are displayed)  Labs Reviewed - No data to display ____________________________________________   ____________________________________________  RADIOLOGY  X-ray of the right shoulder and C-spine  ____________________________________________   PROCEDURES  Procedure(s) performed: No  Procedures    ____________________________________________   INITIAL IMPRESSION / ASSESSMENT AND  PLAN / ED COURSE  Pertinent labs & imaging results that were available during my care of the patient were reviewed by me and considered in my medical decision making (see chart for details).   The patient is a 45 year old female presents with continued right shoulder pain.  See HPI.  Physical exam shows right shoulder and C-spine to be tender to palpation.  X-ray of the right shoulder, x-ray of the C-spine   X-ray of the right shoulder shows a healing cervical fracture, x-ray of the C-spine is negative, these were reviewed by me and confirmed by radiology  Had a long discussion with the patient about the continued clavicle pain.  She seems disgruntled with her orthopedic physician at this time.  I explained to her that she could always ask for second opinion at either [facility] or see her previous orthopedic in [place].  She is to apply ice to the shoulder, wet heat to the musculature between the shoulder and the neck,  she was given a muscle relaxer, pain medication and was cautioned to use the pain medication sparingly.  She is to try increasing her gabapentin that she is only taking 300 mg up to 600 to see if this helps with the radiculopathy.  Also instructed her that she needs to take the sling off occasionally to prevent the remainder of the arm from getting stiff.  She states she understands.  She is discharged in stable condition.  Andrea Hood was evaluated in Emergency Department on 12/08/2020 for the symptoms described in the history of present illness. She was evaluated in the context of the global COVID-19 pandemic, which necessitated consideration that the patient might be at risk for infection with the SARS-CoV-2 virus that causes COVID-19. Institutional protocols and algorithms that pertain to the evaluation of patients at risk for COVID-19 are in a state of rapid change based on information released by regulatory bodies including the CDC and federal and state organizations.  These policies and algorithms were followed during the patient's care in the ED.    As part of my medical decision making, I reviewed the following data within the [place] notes reviewed and incorporated, Old chart reviewed, Radiograph reviewed , Notes from prior ED visits and [place] Controlled Substance Database  ____________________________________________   FINAL CLINICAL IMPRESSION(S) / ED DIAGNOSES  Final diagnoses:  Closed nondisplaced fracture of right clavicle with routine healing, unspecified part of clavicle, subsequent encounter      NEW MEDICATIONS STARTED DURING THIS VISIT:  New Prescriptions   BACLOFEN (LIORESAL) 10 MG TABLET    Take 1 tablet (10 mg total) by mouth daily.   OXYCODONE-ACETAMINOPHEN (PERCOCET) 5-325 MG TABLET    Take 1 tablet by mouth every 4 (four) hours as needed for severe pain.     Note:  This document was prepared using Dragon voice recognition software and may include unintentional dictation errors.    Versie Starks, PA-C 12/08/20 1742    Blake Divine, MD 12/08/20 [id number]

## 2020-12-08 NOTE — ED Triage Notes (Signed)
Patient presents to ER with known right shoulder pain. Patient reports pain is constant, sharp shooting pains start in shoulder and go down arm, and right hand numbness off and on. Has appointment with orthopedist 12/15/2020. Reports she is having extreme pain still since fracture in December 2021. Patient reports her PT sent her here due to pain and they cannot work with her until they know something more is not going on. Patient states "my PT is worried that they missed something on Xray due to swelling." Also reported orthopedist wanted to avoid surgery and heal with PT.

## 2020-12-17 ENCOUNTER — Other Ambulatory Visit: Payer: Self-pay

## 2020-12-17 ENCOUNTER — Encounter: Payer: Self-pay | Admitting: Family Medicine

## 2020-12-17 ENCOUNTER — Ambulatory Visit (INDEPENDENT_AMBULATORY_CARE_PROVIDER_SITE_OTHER): Payer: 59 | Admitting: Family Medicine

## 2020-12-17 VITALS — BP 133/82 | HR 92 | Temp 97.8°F | Ht 64.5 in | Wt 158.8 lb

## 2020-12-17 DIAGNOSIS — M7989 Other specified soft tissue disorders: Secondary | ICD-10-CM | POA: Diagnosis not present

## 2020-12-17 DIAGNOSIS — S42034S Nondisplaced fracture of lateral end of right clavicle, sequela: Secondary | ICD-10-CM

## 2020-12-17 DIAGNOSIS — S42034D Nondisplaced fracture of lateral end of right clavicle, subsequent encounter for fracture with routine healing: Secondary | ICD-10-CM

## 2020-12-17 MED ORDER — TRAMADOL HCL 50 MG PO TABS: 50.0000 mg | ORAL_TABLET | Freq: Two times a day (BID) | ORAL | 0 refills | Status: DC | PRN

## 2020-12-17 NOTE — Progress Notes (Signed)
Office Visit Note   Patient: Andrea Hood           Date of Birth: 10-23-1975           MRN: [id number] Visit Date: 12/17/2020 Requested by: Su Ley, MD [address] [place],  [place] [address] PCP: Su Ley, MD  Subjective: Chief Complaint  Patient presents with  . Right Shoulder - Fracture    HPI: She is here with right shoulder pain.  She has seen Dr. Marlou Sa in the past for other orthopedic issues.  On December 30, she fell landing directly on her right shoulder.  She sustained a distal clavicle comminuted fracture and was evaluated by another orthopedic group.  She was placed in a sling and came back for follow-up about a month later.  X-rays had not changed significantly, she was placed in physical therapy.  After about 3 weeks of PT, her symptoms were getting worse and she was having pain shooting into her hand with a swelling sensation.  Her therapist was concerned about possible DVT versus RSD and referred her to the ER.  She went to the ER last week where x-rays were obtained showing no significant change in fracture healing.  No other testing was done.  She is right-hand dominant, no previous problems with her right shoulder.  No personal history of DVT or RSD.  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               ROS:   All other systems were reviewed and are negative.  Objective: Vital Signs: BP 133/82 (BP Location: Left Arm, Patient Position: Sitting, Cuff Size: Normal)   Pulse 92   Temp 97.8 F (36.6 C) (Oral)   Ht 5' 4.5" (1.638 m)   Wt 158 lb 12.8 oz (72 kg)   SpO2 98%   BMI 26.84 kg/m   Physical Exam:  General:  Alert and oriented, in no acute distress. Pulm:  Breathing unlabored. Psy:  Normal mood, congruent affect. Skin: There is no discoloration of arm, no bruising, no color change in the hand. Right shoulder:  Limited active range of motion because of pain.  Extremely tender to palpation near the distal clavicle.  No swelling of her right arm compared to the left and no temperature change.  Good radial and ulnar pulses, good capillary refill in the fingers.   Imaging: No results found.  Assessment & Plan: 1.  A little more than 2 months status post right distal clavicle comminuted fracture, with delayed union.  Cannot rule out RSD or right upper extremity DVT. -Elected to check a D-dimer today.  We will order MRI of the shoulder.  If D-dimer is positive, then Doppler studies.  If MRI scan shows no other pathology besides the clavicle fracture, consider nerve studies. - Out of work 6 weeks.     Procedures: No procedures performed        PMFS History: Patient Active Problem List   Diagnosis Date Noted  . BACK PAIN 11/19/2010  . INSOMNIA-SLEEP DISORDER-UNSPEC 08/27/2010  . ANXIETY 03/24/2009  . EATING DISORDER, UNSPECIFIED 03/24/2009  . DEPRESSION 03/24/2009  . MIGRAINE HEADACHE 03/24/2009  . GERD 03/24/2009  . GASTRIC ULCER 03/24/2009  . FATIGUE 03/24/2009   Past Medical History:  Diagnosis Date  . Asthma   . Bipolar  2 disorder ([place])   . BRCA negative 12/2019   MyRisk neg; IBIS=13.4%/riskscore=25.3%  . Depression   . Family history of breast cancer   . Family history of ovarian cancer   . Fatty liver   . Gastric ulcer   . Migraines   . Pituitary cyst ([place])   . Seizures ([place])     Family History  Problem Relation Age of Onset  . Cervical cancer Mother 33  . Ovarian cancer Mother 44  . Breast cancer Maternal Grandmother 29  . Breast cancer Maternal Aunt 35    Past Surgical History:  Procedure Laterality Date  . ABDOMINAL HYSTERECTOMY    . CHOLECYSTECTOMY     Social History   Occupational History  . Not on file  Tobacco Use  . Smoking status: Former Smoker    Packs/day: 0.50    Types: Cigarettes  . Smokeless tobacco: Never Used  Vaping Use  . Vaping Use:  Never used  Substance and Sexual Activity  . Alcohol use: Not Currently  . Drug use: No  . Sexual activity: Yes    Birth control/protection: Surgical

## 2020-12-18 ENCOUNTER — Ambulatory Visit (HOSPITAL_COMMUNITY)
Admission: RE | Admit: 2020-12-18 | Discharge: 2020-12-18 | Disposition: A | Discharge status: Home / Self Care | Payer: 59 | Source: Ambulatory Visit | Attending: Family Medicine | Admitting: Family Medicine

## 2020-12-18 ENCOUNTER — Telehealth: Payer: Self-pay | Admitting: Family Medicine

## 2020-12-18 DIAGNOSIS — M7989 Other specified soft tissue disorders: Secondary | ICD-10-CM | POA: Insufficient documentation

## 2020-12-18 DIAGNOSIS — R7989 Other specified abnormal findings of blood chemistry: Secondary | ICD-10-CM | POA: Diagnosis present

## 2020-12-18 LAB — D-DIMER, QUANTITATIVE: D-Dimer, Quant: 1.02 mcg/mL FEU — ABNORMAL HIGH (ref <0.50)

## 2020-12-18 NOTE — Telephone Encounter (Signed)
D-Dimer is positive.  Will order doppler study to look for blood clot.

## 2020-12-18 NOTE — Telephone Encounter (Signed)
Pt scheduled today at [facility] at 345pm, they will call report on Terri's phone.

## 2020-12-18 NOTE — Telephone Encounter (Signed)
Thank you :)

## 2020-12-19 ENCOUNTER — Telehealth: Payer: Self-pay | Admitting: Family Medicine

## 2020-12-19 NOTE — Telephone Encounter (Signed)
No blood clot seen.   

## 2020-12-31 ENCOUNTER — Other Ambulatory Visit: Payer: Self-pay | Admitting: Cardiology

## 2020-12-31 ENCOUNTER — Other Ambulatory Visit: Payer: Self-pay | Admitting: Obstetrics and Gynecology

## 2021-01-05 ENCOUNTER — Other Ambulatory Visit: Payer: Self-pay

## 2021-01-05 ENCOUNTER — Ambulatory Visit
Admission: RE | Admit: 2021-01-05 | Discharge: 2021-01-05 | Disposition: A | Discharge status: Home / Self Care | Payer: 59 | Source: Ambulatory Visit | Attending: Family Medicine | Admitting: Family Medicine

## 2021-01-05 DIAGNOSIS — S42034S Nondisplaced fracture of lateral end of right clavicle, sequela: Secondary | ICD-10-CM

## 2021-01-06 ENCOUNTER — Telehealth: Payer: Self-pay | Admitting: Family Medicine

## 2021-01-06 NOTE — Telephone Encounter (Signed)
MRI shows that the fracture has no sign of healing.  Rotator cuff looks normal.

## 2021-01-07 NOTE — Telephone Encounter (Signed)
Scheduled for 8:15 tomorrow morning  

## 2021-01-07 NOTE — Telephone Encounter (Signed)
Please get patient scheduled for tomorrow or Friday morning. Thanks.

## 2021-01-08 ENCOUNTER — Ambulatory Visit (INDEPENDENT_AMBULATORY_CARE_PROVIDER_SITE_OTHER): Payer: 59 | Admitting: Orthopedic Surgery

## 2021-01-08 DIAGNOSIS — S42034S Nondisplaced fracture of lateral end of right clavicle, sequela: Secondary | ICD-10-CM | POA: Diagnosis not present

## 2021-01-08 DIAGNOSIS — S42034D Nondisplaced fracture of lateral end of right clavicle, subsequent encounter for fracture with routine healing: Secondary | ICD-10-CM

## 2021-01-08 LAB — THYROID PANEL WITH TSH
Free Thyroxine Index: 2.1 (ref 1.4–3.8)
T3 Uptake: 31 % (ref 22–35)
T4, Total: 6.8 ug/dL (ref 5.1–11.9)
TSH: 2.19 mIU/L

## 2021-01-08 LAB — VITAMIN D 25 HYDROXY (VIT D DEFICIENCY, FRACTURES): Vit D, 25-Hydroxy: 30 ng/mL (ref 30–100)

## 2021-01-09 ENCOUNTER — Telehealth: Payer: Self-pay | Admitting: Orthopedic Surgery

## 2021-01-09 NOTE — Telephone Encounter (Signed)
Noted. Dr Dean and Luke both aware.  

## 2021-01-09 NOTE — Telephone Encounter (Signed)
Patient's right open distal clavicle surgery has been rescheduled from 01-12-21 to a new date of 01-19-21 at [facility].  Patient is aware she will need to be off of Rx Phentermine for 7 days prior to surgery.

## 2021-01-14 ENCOUNTER — Encounter: Payer: Self-pay | Admitting: Orthopedic Surgery

## 2021-01-14 NOTE — Progress Notes (Signed)
Office Visit Note   Patient: Andrea Hood           Date of Birth: Mar 07, 1976           MRN: [id number] Visit Date: 01/08/2021 Requested by: Su Ley, MD [address] [place],  [place] [address] PCP: Su Ley, MD  Subjective: Chief Complaint  Patient presents with  . Right Shoulder - Pain    HPI: Andrea Hood is a 45 year old female with right shoulder pain.  Patient had an injury in late December where she fell on the right shoulder.  She has had pain in the shoulder since that time.  She has had an MRI scan which shows nonunion of distal clavicle fracture.  Coracoclavicular ligaments appear intact.  Hard for her to sleep on the right-hand side.  Pain is keeping her from being able to work.  Reports a fire-like pain in the deltoid but denies any radicular symptoms.  MRI scan is reviewed.              ROS: All systems reviewed are negative as they relate to the chief complaint within the history of present illness.  Patient denies  fevers or chills.   Assessment & Plan: Visit Diagnoses:  1. Closed nondisplaced fracture of acromial end of right clavicle, sequela     Plan: Impression is right shoulder distal clavicle symptomatic nonunion.  Plan is to draw thyroid panel and vitamin D panel to assess evaluate and treat the reason for the nonunion if possible.  I do think this is a surgical problem and I discussed that with Andrea Hood.  At this point nearly 3 months out from injury unlikely that the fracture will heal.  Best option would be open distal clavicle excision.  Risk and benefits of that surgery are discussed including not limited to infection nerve vessel damage incomplete pain relief.  Patient understands and wishes to proceed.  She is pretty exquisitely tender in this area.  No clinical evidence yet of frozen shoulder but that may not be far behind.  I do want to check thyroid panel and vitamin D to see if those metabolic abnormalities are  contributing to her nonunion.  All questions answered  Follow-Up Instructions: No follow-ups on file.   Orders:  Orders Placed This Encounter  Procedures  . Thyroid Panel With TSH  . Vitamin D (25 hydroxy)   No orders of the defined types were placed in this encounter.     Procedures: No procedures performed   Clinical Data: No additional findings.  Objective: Vital Signs: There were no vitals taken for this visit.  Physical Exam:   Constitutional: Patient appears well-developed HEENT:  Head: Normocephalic Eyes:EOM are normal Neck: Normal range of motion Cardiovascular: Normal rate Pulmonary/chest: Effort normal Neurologic: Patient is alert Skin: Skin is warm Psychiatric: Patient has normal mood and affect    Ortho Exam: Ortho exam demonstrates full active and passive range of motion of the left shoulder.  On the right-hand side she has a lot of pain with range of motion with the rotator cuff strength.  Discrete tenderness present at the [facility] joint.  Deltoid is functional.  Motor sensory function to the arm is intact.  No masses lymphadenopathy or skin changes noted in the shoulder girdle region.  Does have pain with crossarm adduction.  No significant loss in asymmetry and passive external rotation between the right and left shoulder at this time.  Specialty Comments:  No specialty  comments available.  Imaging: No results found.   PMFS History: Patient Active Problem List   Diagnosis Date Noted  . BACK PAIN 11/19/2010  . INSOMNIA-SLEEP DISORDER-UNSPEC 08/27/2010  . ANXIETY 03/24/2009  . EATING DISORDER, UNSPECIFIED 03/24/2009  . DEPRESSION 03/24/2009  . MIGRAINE HEADACHE 03/24/2009  . GERD 03/24/2009  . GASTRIC ULCER 03/24/2009  . FATIGUE 03/24/2009   Past Medical History:  Diagnosis Date  . Asthma   . Bipolar 2 disorder ([place])   . BRCA negative 12/2019   MyRisk neg; IBIS=13.4%/riskscore=25.3%  . Depression   . Family history of breast cancer   .  Family history of ovarian cancer   . Fatty liver   . Gastric ulcer   . Migraines   . Pituitary cyst ([place])   . Seizures ([place])     Family History  Problem Relation Age of Onset  . Cervical cancer Mother 76  . Ovarian cancer Mother 87  . Breast cancer Maternal Grandmother 58  . Breast cancer Maternal Aunt 35    Past Surgical History:  Procedure Laterality Date  . ABDOMINAL HYSTERECTOMY    . CHOLECYSTECTOMY     Social History   Occupational History  . Not on file  Tobacco Use  . Smoking status: Former Smoker    Packs/day: 0.50    Types: Cigarettes  . Smokeless tobacco: Never Used  Vaping Use  . Vaping Use: Never used  Substance and Sexual Activity  . Alcohol use: Not Currently  . Drug use: No  . Sexual activity: Yes    Birth control/protection: Surgical

## 2021-01-19 ENCOUNTER — Other Ambulatory Visit: Payer: Self-pay | Admitting: Surgical

## 2021-01-19 DIAGNOSIS — M19011 Primary osteoarthritis, right shoulder: Secondary | ICD-10-CM | POA: Diagnosis not present

## 2021-01-19 MED ORDER — METHOCARBAMOL 500 MG PO TABS: 500.0000 mg | ORAL_TABLET | Freq: Three times a day (TID) | ORAL | 0 refills | Status: DC | PRN

## 2021-01-19 MED ORDER — OXYCODONE-ACETAMINOPHEN 5-325 MG PO TABS: 1.0000 | ORAL_TABLET | ORAL | 0 refills | Status: DC | PRN

## 2021-01-21 ENCOUNTER — Inpatient Hospital Stay: Payer: 59 | Admitting: Orthopedic Surgery

## 2021-01-25 ENCOUNTER — Other Ambulatory Visit: Payer: Self-pay

## 2021-01-26 ENCOUNTER — Ambulatory Visit (INDEPENDENT_AMBULATORY_CARE_PROVIDER_SITE_OTHER): Payer: 59 | Admitting: Orthopedic Surgery

## 2021-01-26 DIAGNOSIS — S42034S Nondisplaced fracture of lateral end of right clavicle, sequela: Secondary | ICD-10-CM

## 2021-01-26 MED ORDER — METHOCARBAMOL 500 MG PO TABS: 500.0000 mg | ORAL_TABLET | Freq: Three times a day (TID) | ORAL | 0 refills | Status: DC | PRN

## 2021-01-26 MED ORDER — OXYCODONE-ACETAMINOPHEN 5-325 MG PO TABS: 1.0000 | ORAL_TABLET | Freq: Four times a day (QID) | ORAL | 0 refills | Status: DC | PRN

## 2021-01-26 NOTE — Telephone Encounter (Signed)
Pls advise.  

## 2021-01-26 NOTE — Telephone Encounter (Signed)
Refilled today

## 2021-01-27 ENCOUNTER — Encounter: Payer: Self-pay | Admitting: Orthopedic Surgery

## 2021-01-27 NOTE — Telephone Encounter (Signed)
Can you refuse med refill?

## 2021-01-27 NOTE — Progress Notes (Signed)
Post-Op Visit Note   Patient: Andrea Hood           Date of Birth: [birth year]           MRN: [id number] Visit Date: 01/26/2021 PCP: Su Ley, MD   Assessment & Plan:  Chief Complaint:  Chief Complaint  Patient presents with  . Right Shoulder - Routine Post Op   Visit Diagnoses:  1. Closed nondisplaced fracture of acromial end of right clavicle, sequela     Plan: Patient is a 45 year old female who presents s/p right clavicle open distal clavicle excision on 01/19/2021.  She states that she has been in the sling since surgery and has severe pain anytime she moves her right shoulder even a little bit.  She has difficulty lifting her arm due to pain.  She is waking with pain about every 2 hours and currently has run out of pain medication.  She is able to sleep in her bed with a wedge pillow.  On exam she has healing incision without any evidence of infection or dehiscence.  2+ radial pulse of the right upper extremity.  Axillary nerve intact with deltoid firing.  She has severe pain with any passive motion that is attempted so plan to defer checking her passive motion until next office visit in 2 weeks.  Highly recommended that patient move the right arm as much as she can tolerate and come out of the sling when she is at home.  She would benefit from using the sling when she is out and about to prevent other people from causing her pain by bumping into her arm, etc.  She will do pendulums as she can tolerate as well.  Follow-up in 2 weeks for clinical recheck.  Follow-Up Instructions: No follow-ups on file.   Orders:  No orders of the defined types were placed in this encounter.  Meds ordered this encounter  Medications  . oxyCODONE-acetaminophen (PERCOCET) 5-325 MG tablet    Sig: Take 1 tablet by mouth every 6 (six) hours as needed for severe pain.    Dispense:  30 tablet    Refill:  0  . methocarbamol (ROBAXIN) 500 MG tablet    Sig: Take 1 tablet (500 mg total) by mouth  every 8 (eight) hours as needed.    Dispense:  30 tablet    Refill:  0    Imaging: No results found.  PMFS History: Patient Active Problem List   Diagnosis Date Noted  . BACK PAIN 11/19/2010  . INSOMNIA-SLEEP DISORDER-UNSPEC 08/27/2010  . ANXIETY 03/24/2009  . EATING DISORDER, UNSPECIFIED 03/24/2009  . DEPRESSION 03/24/2009  . MIGRAINE HEADACHE 03/24/2009  . GERD 03/24/2009  . GASTRIC ULCER 03/24/2009  . FATIGUE 03/24/2009   Past Medical History:  Diagnosis Date  . Asthma   . Bipolar 2 disorder ([place])   . BRCA negative 12/2019   MyRisk neg; IBIS=13.4%/riskscore=25.3%  . Depression   . Family history of breast cancer   . Family history of ovarian cancer   . Fatty liver   . Gastric ulcer   . Migraines   . Pituitary cyst ([place])   . Seizures ([place])     Family History  Problem Relation Age of Onset  . Cervical cancer Mother 16  . Ovarian cancer Mother 66  . Breast cancer Maternal Grandmother 53  . Breast cancer Maternal Aunt 35    Past Surgical History:  Procedure Laterality Date  . ABDOMINAL HYSTERECTOMY    . CHOLECYSTECTOMY  Social History   Occupational History  . Not on file  Tobacco Use  . Smoking status: Former Smoker    Packs/day: 0.50    Types: Cigarettes  . Smokeless tobacco: Never Used  Vaping Use  . Vaping Use: Never used  Substance and Sexual Activity  . Alcohol use: Not Currently  . Drug use: No  . Sexual activity: Yes    Birth control/protection: Surgical

## 2021-01-29 ENCOUNTER — Other Ambulatory Visit: Payer: Self-pay

## 2021-01-29 ENCOUNTER — Telehealth: Payer: Self-pay

## 2021-01-29 NOTE — Telephone Encounter (Signed)
Done

## 2021-01-29 NOTE — Telephone Encounter (Signed)
Patient called she is requesting a copy of absence of leave paper work to be emailed to her call [phone] Email: [email or web address]

## 2021-02-02 MED ORDER — OXYCODONE-ACETAMINOPHEN 5-325 MG PO TABS: 1.0000 | ORAL_TABLET | Freq: Three times a day (TID) | ORAL | 0 refills | Status: DC | PRN

## 2021-02-02 NOTE — Telephone Encounter (Signed)
Please advise 

## 2021-02-02 NOTE — Telephone Encounter (Signed)
Sent in

## 2021-02-06 ENCOUNTER — Telehealth: Payer: Self-pay | Admitting: Orthopedic Surgery

## 2021-02-06 NOTE — Telephone Encounter (Signed)
Patient called requesting medical release form, short term disability, $25.00 check payment to [employer]. Accept 02/06/21

## 2021-02-09 ENCOUNTER — Encounter: Payer: Self-pay | Admitting: Orthopedic Surgery

## 2021-02-09 NOTE — Telephone Encounter (Signed)
We can check it out pls remind me thx

## 2021-02-10 ENCOUNTER — Other Ambulatory Visit: Payer: Self-pay

## 2021-02-10 MED ORDER — OXYCODONE-ACETAMINOPHEN 5-325 MG PO TABS: 1.0000 | ORAL_TABLET | Freq: Two times a day (BID) | ORAL | 0 refills | Status: DC | PRN

## 2021-02-10 NOTE — Telephone Encounter (Signed)
Sent this in 3 hours ago is there a problem with RX?

## 2021-02-10 NOTE — Telephone Encounter (Signed)
Please advise 

## 2021-02-11 ENCOUNTER — Encounter: Payer: 59 | Admitting: Orthopedic Surgery

## 2021-02-12 ENCOUNTER — Telehealth: Payer: Self-pay

## 2021-02-12 NOTE — Telephone Encounter (Signed)
Received mychart message regarding patients appointment from yesterday patient cancelled her appointment due to August Saucer being a few minutes behind patient is a post op can you work patient into the schedule she is requesting  Monday am Tuesday am Thursday am and Friday am ** no wednesdays call back:[phone]

## 2021-02-12 NOTE — Telephone Encounter (Signed)
Ok to work her in next Thursday or Friday morning

## 2021-02-19 ENCOUNTER — Ambulatory Visit: Payer: Self-pay

## 2021-02-19 ENCOUNTER — Other Ambulatory Visit: Payer: Self-pay

## 2021-02-19 ENCOUNTER — Ambulatory Visit (INDEPENDENT_AMBULATORY_CARE_PROVIDER_SITE_OTHER): Payer: 59 | Admitting: Orthopedic Surgery

## 2021-02-19 ENCOUNTER — Encounter: Payer: Self-pay | Admitting: Orthopedic Surgery

## 2021-02-19 DIAGNOSIS — M25462 Effusion, left knee: Secondary | ICD-10-CM

## 2021-02-19 DIAGNOSIS — M25562 Pain in left knee: Secondary | ICD-10-CM | POA: Diagnosis not present

## 2021-02-19 DIAGNOSIS — G8929 Other chronic pain: Secondary | ICD-10-CM

## 2021-02-19 DIAGNOSIS — S42034S Nondisplaced fracture of lateral end of right clavicle, sequela: Secondary | ICD-10-CM

## 2021-02-19 MED ORDER — BUPIVACAINE HCL 0.25 % IJ SOLN
4.0000 mL | INTRAMUSCULAR | Status: AC | PRN
  Administered 2021-02-19: 4 mL via INTRA_ARTICULAR

## 2021-02-19 MED ORDER — LIDOCAINE HCL 1 % IJ SOLN
5.0000 mL | INTRAMUSCULAR | Status: AC | PRN
  Administered 2021-02-19: 5 mL

## 2021-02-19 MED ORDER — BETAMETHASONE SOD PHOS & ACET 6 (3-3) MG/ML IJ SUSP
6.0000 mg | INTRAMUSCULAR | Status: AC | PRN
  Administered 2021-02-19: 6 mg via INTRA_ARTICULAR

## 2021-02-19 NOTE — Progress Notes (Signed)
Office Visit Note   Patient: Andrea Hood           Date of Birth: [birth year]           MRN: [id number] Visit Date: 02/19/2021 Requested by: Su Ley, MD [address] [place],  [place] [address] PCP: Su Ley, MD  Subjective: Chief Complaint  Patient presents with  . Right Shoulder - Routine Post Op    HPI: Andrea Hood is a 45 year old patient underwent right clavicle excision for nonunion fracture 01/19/2021.  She is been in a sling.  Reports some occasional numbness and tingling in her small and ring finger.  Also reports left knee pain.  MRI scan from May 2021 shows mild patellar chondromalacia otherwise intact.              ROS: All systems reviewed are negative as they relate to the chief complaint within the history of present illness.  Patient denies  fevers or chills.   Assessment & Plan: Visit Diagnoses:  1. Chronic pain of left knee   2. 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  Collateral crucial ligaments are stable.  Aspiration injection performed on that.  Regarding the shoulder we will need to start physical therapy for range of motion and strengthening.  2 times a week for 6 weeks of stairs.  Discontinue sling.  No lifting more than 5 pounds for the next 2 weeks.  Follow-up in 4 weeks for clinical recheck  Follow-Up Instructions: Return in about 4 weeks (around 03/19/2021).   Orders:  Orders Placed This Encounter  Procedures  . XR KNEE 3 VIEW LEFT  . Ambulatory referral to Physical Therapy   No orders of the defined types were placed in this encounter.     Procedures: Large Joint Inj: R knee on 02/19/2021 12:55 PM Indications: diagnostic evaluation, joint swelling and pain Details: 18 G 1.5 in needle, superolateral approach  Arthrogram: No  Medications: 5 mL lidocaine 1 %; 4 mL bupivacaine 0.25 %; 6 mg  betamethasone acetate-betamethasone sodium phosphate 6 (3-3) MG/ML Outcome: tolerated well, no immediate complications Procedure, treatment alternatives, risks and benefits explained, specific risks discussed. Consent was given by the patient. Immediately prior to procedure a time out was called to verify the correct patient, procedure, equipment, support staff and site/side marked as required. Patient was prepped and draped in the usual sterile fashion.       Clinical Data: No additional findings.  Objective: Vital Signs: There were no vitals taken for this visit.  Physical Exam:   Constitutional: Patient appears well-developed HEENT:  Head: Normocephalic Eyes:EOM are normal Neck: Normal range of motion Cardiovascular: Normal rate Pulmonary/chest: Effort normal Neurologic: Patient is alert Skin: Skin is warm Psychiatric: Patient has normal mood and affect    Ortho Exam: Ortho exam demonstrates full active and passive range of motion of the left knee.  Mild effusion is present.  Patellofemoral crepitus which is mild is present bilaterally.  Collateral pressure ligaments are stable.  Ankle dorsiflexion intact.  Pedal pulses palpable.  No groin pain with internal or external [place] of the leg.  Patella tracking intact.  Right shoulder is examined.  She has fairly reasonable passive range of motion.  Incision is mildly tender but no redness or erythema present.  Rotator cuff strength is intact.  Specialty Comments:  No specialty comments available.  Imaging: XR KNEE 3 VIEW LEFT  Result Date: 02/19/2021 AP lateral merchant radiographs left knee reviewed.  Alignment intact.  No acute fracture.  No degenerative joint disease with osteophytes or joint space narrowing in the medial lateral or patellofemoral compartment    PMFS History: Patient Active Problem List   Diagnosis Date Noted  . BACK PAIN 11/19/2010  . INSOMNIA-SLEEP DISORDER-UNSPEC 08/27/2010  . ANXIETY 03/24/2009  .  EATING DISORDER, UNSPECIFIED 03/24/2009  . DEPRESSION 03/24/2009  . MIGRAINE HEADACHE 03/24/2009  . GERD 03/24/2009  . GASTRIC ULCER 03/24/2009  . FATIGUE 03/24/2009   Past Medical History:  Diagnosis Date  . Asthma   . Bipolar 2 disorder ([place])   . BRCA negative 12/2019   MyRisk neg; IBIS=13.4%/riskscore=25.3%  . Depression   . Family history of breast cancer   . Family history of ovarian cancer   . Fatty liver   . Gastric ulcer   . Migraines   . Pituitary cyst ([place])   . Seizures ([place])     Family History  Problem Relation Age of Onset  . Cervical cancer Mother 82  . Ovarian cancer Mother 21  . Breast cancer Maternal Grandmother 77  . Breast cancer Maternal Aunt 35    Past Surgical History:  Procedure Laterality Date  . ABDOMINAL HYSTERECTOMY    . CHOLECYSTECTOMY     Social History   Occupational History  . Not on file  Tobacco Use  . Smoking status: Former Smoker    Packs/day: 0.50    Types: Cigarettes  . Smokeless tobacco: Never Used  Vaping Use  . Vaping Use: Never used  Substance and Sexual Activity  . Alcohol use: Not Currently  . Drug use: No  . Sexual activity: Yes    Birth control/protection: Surgical

## 2021-02-22 ENCOUNTER — Other Ambulatory Visit: Payer: Self-pay

## 2021-02-23 MED ORDER — OXYCODONE-ACETAMINOPHEN 5-325 MG PO TABS: 1.0000 | ORAL_TABLET | Freq: Every day | ORAL | 0 refills | Status: DC | PRN

## 2021-02-23 MED ORDER — METHOCARBAMOL 500 MG PO TABS: 500.0000 mg | ORAL_TABLET | Freq: Three times a day (TID) | ORAL | 0 refills | Status: DC | PRN

## 2021-02-24 ENCOUNTER — Ambulatory Visit: Payer: 59 | Admitting: Physical Therapy

## 2021-03-05 ENCOUNTER — Ambulatory Visit: Payer: 59 | Admitting: Physical Therapy

## 2021-03-06 ENCOUNTER — Ambulatory Visit: Payer: 59 | Admitting: Rehabilitative and Restorative Service Providers"

## 2021-03-18 ENCOUNTER — Other Ambulatory Visit: Payer: Self-pay

## 2021-03-19 MED ORDER — OXYCODONE-ACETAMINOPHEN 5-325 MG PO TABS: 1.0000 | ORAL_TABLET | Freq: Every day | ORAL | 0 refills | Status: DC | PRN

## 2021-03-23 ENCOUNTER — Encounter: Payer: 59 | Admitting: Orthopedic Surgery

## 2021-03-24 ENCOUNTER — Ambulatory Visit: Payer: 59 | Admitting: Rehabilitative and Restorative Service Providers"

## 2021-03-26 ENCOUNTER — Ambulatory Visit: Payer: 59 | Admitting: Physical Therapy

## 2021-04-02 ENCOUNTER — Other Ambulatory Visit: Payer: Self-pay

## 2021-04-02 ENCOUNTER — Encounter: Payer: 59 | Admitting: Orthopedic Surgery

## 2021-04-03 ENCOUNTER — Other Ambulatory Visit: Payer: Self-pay | Admitting: Surgical

## 2021-04-03 MED ORDER — HYDROCODONE-ACETAMINOPHEN 5-325 MG PO TABS: 1.0000 | ORAL_TABLET | Freq: Every day | ORAL | 0 refills | Status: DC | PRN

## 2021-04-03 NOTE — Telephone Encounter (Signed)
Sent in RX for Norco 5mg  daily prn as a step down from oxy.  Working toward discontinuing opioid medication altogether

## 2021-04-03 NOTE — Telephone Encounter (Signed)
Please advise 

## 2021-04-10 ENCOUNTER — Other Ambulatory Visit: Payer: Self-pay | Admitting: Obstetrics and Gynecology

## 2021-04-10 DIAGNOSIS — B009 Herpesviral infection, unspecified: Secondary | ICD-10-CM

## 2021-04-15 ENCOUNTER — Other Ambulatory Visit: Payer: Self-pay

## 2021-04-16 ENCOUNTER — Other Ambulatory Visit: Payer: Self-pay

## 2021-04-16 MED ORDER — HYDROCODONE-ACETAMINOPHEN 5-325 MG PO TABS: 1.0000 | ORAL_TABLET | Freq: Every day | ORAL | 0 refills | Status: DC | PRN

## 2021-04-16 NOTE — Telephone Encounter (Signed)
Responded to in different message

## 2021-04-26 ENCOUNTER — Other Ambulatory Visit: Payer: Self-pay

## 2021-04-28 ENCOUNTER — Other Ambulatory Visit: Payer: Self-pay

## 2021-04-29 ENCOUNTER — Telehealth: Payer: Self-pay | Admitting: Orthopedic Surgery

## 2021-04-29 MED ORDER — HYDROCODONE-ACETAMINOPHEN 5-325 MG PO TABS: 1.0000 | ORAL_TABLET | Freq: Every day | ORAL | 0 refills | Status: DC | PRN

## 2021-04-29 NOTE — Telephone Encounter (Signed)
Pt sent a mychart message about a refill of hydrocodone. Please send to pharmacy on file. Pt asking for a call when meds have been call in. Pt phone number is [phone].

## 2021-04-29 NOTE — Telephone Encounter (Signed)
Approved in another message

## 2021-04-29 NOTE — Telephone Encounter (Signed)
Approved in another message, sent to betsy 04/29/21

## 2021-05-20 ENCOUNTER — Other Ambulatory Visit: Payer: Self-pay

## 2021-05-22 ENCOUNTER — Other Ambulatory Visit: Payer: Self-pay

## 2021-05-22 NOTE — Telephone Encounter (Signed)
Pt calling to verify and check on the status of her prescription refill request. The best pharmacy is [facility]) and the best call back if needed is [phone].

## 2021-05-24 ENCOUNTER — Other Ambulatory Visit: Payer: Self-pay

## 2021-05-25 NOTE — Telephone Encounter (Signed)
I'm happy to prescribe something for pain but i'd rather avoid opioid medications as she is fairly far out from procedure to still need narcotics.  I can prescribe an anti-inflammatory like Mobic or Celebrex which should be helpful if she is okay with that

## 2021-05-26 ENCOUNTER — Encounter: Payer: Self-pay | Admitting: Orthopedic Surgery

## 2021-05-26 NOTE — Telephone Encounter (Signed)
I don't think taking opioid medication is a great plan long-term with how far she is out from procedure.  I'd rather try a combination of Tylenol and Meloxicam or Celebrex to see if that will help with her pain.  Can you see if she is okay with that plan?

## 2021-05-27 ENCOUNTER — Other Ambulatory Visit: Payer: Self-pay | Admitting: Surgical

## 2021-05-27 MED ORDER — TRAMADOL HCL 50 MG PO TABS: 50.0000 mg | ORAL_TABLET | Freq: Two times a day (BID) | ORAL | 0 refills | Status: DC | PRN

## 2021-05-27 NOTE — Telephone Encounter (Signed)
Sent in tramadol and called patient. She was offered to try tylenol and topical NSAID vs trying Cymbalta but she wanted to try tramadol.  Do not want to continue long term but can use it up to her next appointment. Hydrocodone dced

## 2021-06-04 ENCOUNTER — Encounter: Payer: Self-pay | Admitting: Orthopedic Surgery

## 2021-06-04 ENCOUNTER — Other Ambulatory Visit: Payer: Self-pay | Admitting: Surgical

## 2021-06-05 ENCOUNTER — Other Ambulatory Visit: Payer: Self-pay | Admitting: Surgical

## 2021-06-05 NOTE — Telephone Encounter (Signed)
Sent in

## 2021-06-15 ENCOUNTER — Other Ambulatory Visit: Payer: Self-pay | Admitting: Surgical

## 2021-06-15 ENCOUNTER — Other Ambulatory Visit: Payer: Self-pay

## 2021-06-15 DIAGNOSIS — S42034S Nondisplaced fracture of lateral end of right clavicle, sequela: Secondary | ICD-10-CM

## 2021-06-16 ENCOUNTER — Other Ambulatory Visit: Payer: Self-pay

## 2021-06-16 DIAGNOSIS — M25562 Pain in left knee: Secondary | ICD-10-CM

## 2021-06-16 DIAGNOSIS — S42034S Nondisplaced fracture of lateral end of right clavicle, sequela: Secondary | ICD-10-CM

## 2021-06-16 DIAGNOSIS — G8929 Other chronic pain: Secondary | ICD-10-CM

## 2021-06-17 ENCOUNTER — Other Ambulatory Visit: Payer: Self-pay | Admitting: Orthopedic Surgery

## 2021-06-17 DIAGNOSIS — S42034S Nondisplaced fracture of lateral end of right clavicle, sequela: Secondary | ICD-10-CM

## 2021-06-17 NOTE — Telephone Encounter (Signed)
Pt called requesting a refill of tramadol. Please send to pharmacy on file. Pt phone number is [phone].

## 2021-06-17 NOTE — Telephone Encounter (Signed)
Last filled 06/05/2021 Qty: 30 tablets Refills: 0 Patient is taking one tablet by mouth every 8 hours as needed.

## 2021-06-18 MED ORDER — TRAMADOL HCL 50 MG PO TABS: 50.0000 mg | ORAL_TABLET | Freq: Three times a day (TID) | ORAL | 0 refills | Status: DC | PRN

## 2021-06-25 ENCOUNTER — Other Ambulatory Visit: Payer: Self-pay

## 2021-06-25 DIAGNOSIS — S42034S Nondisplaced fracture of lateral end of right clavicle, sequela: Secondary | ICD-10-CM

## 2021-06-26 NOTE — Telephone Encounter (Signed)
This medication was just refilled on 06/18/2021.

## 2021-06-30 ENCOUNTER — Other Ambulatory Visit: Payer: Self-pay | Admitting: Surgical

## 2021-06-30 DIAGNOSIS — S42034S Nondisplaced fracture of lateral end of right clavicle, sequela: Secondary | ICD-10-CM

## 2021-07-01 ENCOUNTER — Other Ambulatory Visit: Payer: Self-pay

## 2021-07-01 DIAGNOSIS — S42034S Nondisplaced fracture of lateral end of right clavicle, sequela: Secondary | ICD-10-CM

## 2021-07-01 NOTE — Telephone Encounter (Signed)
Needs appt prior to refill

## 2021-07-04 MED ORDER — TRAMADOL HCL 50 MG PO TABS: 50.0000 mg | ORAL_TABLET | Freq: Three times a day (TID) | ORAL | 0 refills | Status: AC | PRN

## 2021-07-10 ENCOUNTER — Other Ambulatory Visit: Payer: Self-pay | Admitting: Surgical

## 2021-07-10 DIAGNOSIS — S42034S Nondisplaced fracture of lateral end of right clavicle, sequela: Secondary | ICD-10-CM

## 2021-07-11 ENCOUNTER — Other Ambulatory Visit: Payer: Self-pay

## 2021-07-11 DIAGNOSIS — S42034S Nondisplaced fracture of lateral end of right clavicle, sequela: Secondary | ICD-10-CM

## 2021-07-12 NOTE — Telephone Encounter (Signed)
Have told her multiple times that she needs an appointment prior to refilling medication.  She told me that she wanted one refill prior to bridge the gap until her appointment so I did refill again recently.  However, upon checking chart she has no scheduled appointment.  I will not be refilling this medication until she is seen in-person. Recommend OTC Motrin and tylenol for pain control until then. Has not been seen since May 5th

## 2021-07-13 ENCOUNTER — Telehealth: Payer: 59 | Admitting: Physician Assistant

## 2021-07-13 ENCOUNTER — Other Ambulatory Visit: Payer: Self-pay

## 2021-07-13 DIAGNOSIS — G8929 Other chronic pain: Secondary | ICD-10-CM

## 2021-07-13 DIAGNOSIS — S42034S Nondisplaced fracture of lateral end of right clavicle, sequela: Secondary | ICD-10-CM

## 2021-07-13 NOTE — Progress Notes (Signed)
Based on what you shared with me, I feel your condition warrants further evaluation and I recommend that you be seen in a face to face visit. Giving chronicity and severity of pain, pain worse with breathing and recent surgery, you need to be evaluated in person either with the provider that did your procedure, your PCP, or a local Urgent Care provider.    NOTE: There will be NO CHARGE for this eVisit   If you are having a true medical emergency please call 911.      For an urgent face to face visit, [facility] has six urgent care centers for your convenience:     [facility] Health Urgent Care Center at [facility] Directions [phone] [address] [place], [place] [address]    [facility] Health Urgent Care Center [facility]) Get Driving Directions [phone] [address] [place], [place] [address]  [facility] Health Urgent Care Center [facility]) Get Driving Directions [phone] [address] [place],  [place]  [address]  [facility] Health Urgent Care at [facility] Get Driving Directions [phone] [address], [address] [place], [place] [address]   [facility] Health Urgent Care at [facility] Get Driving Directions  [phone] [address] [place], [place] [address]   [facility] Health Urgent Care at [facility] Directions [phone] [address] [place], [place] [address]  Your MyChart E-visit questionnaire answers were reviewed by a board certified advanced clinical practitioner to complete your personal care plan based on your specific symptoms.  Thank you for using e-Visits.

## 2021-07-13 NOTE — Telephone Encounter (Signed)
Appreciate that she scheduled her appointment but I don't think it is appropriate to refill any opioid medication until she is seen again in person. If she needs sooner appt she is welcome to see me on Thursday or Friday

## 2021-07-14 ENCOUNTER — Encounter: Payer: Self-pay | Admitting: Orthopedic Surgery

## 2021-07-14 NOTE — Telephone Encounter (Signed)
Pt portal message sent to pt already informing her

## 2021-07-29 ENCOUNTER — Ambulatory Visit: Payer: 59 | Admitting: Orthopedic Surgery

## 2021-08-24 ENCOUNTER — Emergency Department
Admission: EM | Admit: 2021-08-24 | Discharge: 2021-08-24 | Disposition: A | Discharge status: Home / Self Care | Payer: 59 | Attending: Emergency Medicine | Admitting: Emergency Medicine

## 2021-08-24 ENCOUNTER — Emergency Department: Payer: 59

## 2021-08-24 ENCOUNTER — Other Ambulatory Visit: Payer: Self-pay

## 2021-08-24 DIAGNOSIS — R0789 Other chest pain: Secondary | ICD-10-CM | POA: Diagnosis present

## 2021-08-24 DIAGNOSIS — Z87891 Personal history of nicotine dependence: Secondary | ICD-10-CM | POA: Diagnosis not present

## 2021-08-24 DIAGNOSIS — R079 Chest pain, unspecified: Secondary | ICD-10-CM

## 2021-08-24 DIAGNOSIS — J45909 Unspecified asthma, uncomplicated: Secondary | ICD-10-CM | POA: Insufficient documentation

## 2021-08-24 LAB — COMPREHENSIVE METABOLIC PANEL
ALT: 35 U/L (ref 0–44)
AST: 40 U/L (ref 15–41)
Albumin: 4.2 g/dL (ref 3.5–5.0)
Alkaline Phosphatase: 72 U/L (ref 38–126)
Anion gap: 9 (ref 5–15)
BUN: 16 mg/dL (ref 6–20)
CO2: 25 mmol/L (ref 22–32)
Calcium: 9.1 mg/dL (ref 8.9–10.3)
Chloride: 104 mmol/L (ref 98–111)
Creatinine, Ser: 0.74 mg/dL (ref 0.44–1.00)
GFR, Estimated: >60 mL/min (ref >60)
Glucose, Bld: 85 mg/dL (ref 70–99)
Potassium: 3.6 mmol/L (ref 3.5–5.1)
Sodium: 138 mmol/L (ref 135–145)
Total Bilirubin: 0.7 mg/dL (ref 0.3–1.2)
Total Protein: 7.1 g/dL (ref 6.5–8.1)

## 2021-08-24 LAB — BASIC METABOLIC PANEL
Anion gap: 9 (ref 5–15)
BUN: 19 mg/dL (ref 6–20)
CO2: 25 mmol/L (ref 22–32)
Calcium: 9.2 mg/dL (ref 8.9–10.3)
Chloride: 104 mmol/L (ref 98–111)
Creatinine, Ser: 0.72 mg/dL (ref 0.44–1.00)
GFR, Estimated: >60 mL/min (ref >60)
Glucose, Bld: 97 mg/dL (ref 70–99)
Potassium: 3.3 mmol/L — ABNORMAL LOW (ref 3.5–5.1)
Sodium: 138 mmol/L (ref 135–145)

## 2021-08-24 LAB — CBC
HCT: 38.9 % (ref 36.0–46.0)
Hemoglobin: 12.4 g/dL (ref 12.0–15.0)
MCH: 27.4 pg (ref 26.0–34.0)
MCHC: 31.9 g/dL (ref 30.0–36.0)
MCV: 85.9 fL (ref 80.0–100.0)
Platelets: 276 10*3/uL (ref 150–400)
RBC: 4.53 MIL/uL (ref 3.87–5.11)
RDW: 13.9 % (ref 11.5–15.5)
WBC: 7.8 10*3/uL (ref 4.0–10.5)
nRBC: 0 % (ref 0.0–0.2)

## 2021-08-24 LAB — TROPONIN I (HIGH SENSITIVITY)
Troponin I (High Sensitivity): <2 ng/L (ref <18)
Troponin I (High Sensitivity): 3 ng/L (ref <18)

## 2021-08-24 LAB — D-DIMER, QUANTITATIVE: D-Dimer, Quant: 0.71 ug/mL-FEU — ABNORMAL HIGH (ref 0.00–0.50)

## 2021-08-24 LAB — LIPASE, BLOOD: Lipase: 30 U/L (ref 11–51)

## 2021-08-24 MED ORDER — IOHEXOL 350 MG/ML SOLN
75.0000 mL | Freq: Once | INTRAVENOUS | Status: AC | PRN
  Administered 2021-08-24: 75 mL via INTRAVENOUS
  Filled 2021-08-24: qty 75

## 2021-08-24 NOTE — ED Triage Notes (Signed)
Pt to ED for centralized chest pain that started a few days ago and worsened today. +shob. Denies n/v.  Ambulatory

## 2021-08-24 NOTE — Discharge Instructions (Signed)
Your CT scan of your chest your EKG and blood work was all reassuring today.  You likely have some inflammation of your lungs related to your recent viral infection which is causing your pain.  If you continue to have symptoms, please follow-up with your primary care provider.

## 2021-08-24 NOTE — ED Provider Notes (Signed)
[facility]  ____________________________________________   Event Date/Time   First MD Initiated Contact with Patient 08/24/21 1341     (approximate)  I have reviewed the triage vital signs and the nursing notes.   HISTORY  Chief Complaint Chest Pain    HPI Andrea Hood is a 45 y.o. female with past medical history of asthma, bipolar disorder, migraines, seizures who presents with chest pain.  Symptoms started about 2 to 3 days ago.  She endorses sharp pressure-like sensation in the mid chest that is intermittent.  No clear exacerbating factor other than deep breathing.  She does have some mild dyspnea.  Denies fevers chills.  Did have a viral syndrome 2 weeks previously which has largely resolved.  She does endorse some upper abdominal pain, which started today.  She has had some nausea but no vomiting or diarrhea.         Past Medical History:  Diagnosis Date   Asthma    Bipolar 2 disorder ([place])    BRCA negative 12/2019   MyRisk neg; IBIS=13.4%/riskscore=25.3%   Depression    Family history of breast cancer    Family history of ovarian cancer    Fatty liver    Gastric ulcer    Migraines    Pituitary cyst ([place])    Seizures ([place])     Patient Active Problem List   Diagnosis Date Noted   BACK PAIN 11/19/2010   INSOMNIA-SLEEP DISORDER-UNSPEC 08/27/2010   ANXIETY 03/24/2009   EATING DISORDER, UNSPECIFIED 03/24/2009   DEPRESSION 03/24/2009   MIGRAINE HEADACHE 03/24/2009   GERD 03/24/2009   GASTRIC ULCER 03/24/2009   FATIGUE 03/24/2009    Past Surgical History:  Procedure Laterality Date   ABDOMINAL HYSTERECTOMY     CHOLECYSTECTOMY      Prior to Admission medications   Medication Sig Start Date End Date Taking? Authorizing Provider  albuterol (PROVENTIL HFA;VENTOLIN HFA) 108 (90 BASE) MCG/ACT inhaler Inhale 2 puffs into the lungs every 6 (six) hours as needed. Shortness of breath and wheezing    [provider]  baclofen  (LIORESAL) 10 MG tablet Take 1 tablet (10 mg total) by mouth daily. 12/08/20 12/08/21  Caryn Section Linden Dolin, PA-C  cholecalciferol (VITAMIN D) 1000 UNITS tablet Take 1,000 Units by mouth daily.    [provider]  clonazePAM (KLONOPIN) 1 MG tablet TAKE 1 TABLET BY MOUTH TWICE DAILY 02/07/11   Copland, Frederico Hamman, MD  gabapentin (NEURONTIN) 100 MG capsule Take 200 mg by mouth 3 (three) times daily. 10/14/19   [provider]  methocarbamol (ROBAXIN) 500 MG tablet Take 1 tablet (500 mg total) by mouth every 8 (eight) hours as needed. 02/23/21   Magnant, Gerrianne Scale, PA-C  phentermine 37.5 MG capsule Take 37.5 mg by mouth every morning. 12/08/19   [provider]  Spacer/Aero-Holding Chambers (AEROCHAMBER MV) inhaler by Does not apply route. 02/19/15   [provider]  traMADol (ULTRAM) 50 MG tablet Take 1 tablet (50 mg total) by mouth every 8 (eight) hours as needed. 07/04/21   Magnant, Charles L, PA-C  valACYclovir (VALTREX) 500 MG tablet TAKE 1 TABLET(500 MG) BY MOUTH DAILY 04/10/21   Schuman, Christanna R, MD  vitamin B-12 (CYANOCOBALAMIN) 1000 MCG tablet Take 1,000 mcg by mouth daily.    [provider]    Allergies Depakote [divalproex sodium], Lamictal [lamotrigine], and Topiramate  Family History  Problem Relation Age of Onset   Cervical cancer Mother 39   Ovarian cancer Mother 29  Breast cancer Maternal Grandmother 70   Breast cancer Maternal Aunt 35    Social History Social History   Tobacco Use   Smoking status: Former    Packs/day: 0.50    Types: Cigarettes   Smokeless tobacco: Never  Vaping Use   Vaping Use: Never used  Substance Use Topics   Alcohol use: Not Currently   Drug use: No    Review of Systems   Review of Systems  Constitutional:  Negative for appetite change, chills and fever.  Respiratory:  Positive for chest tightness and shortness of breath.   Cardiovascular:  Positive for chest pain. Negative for palpitations and leg  swelling.  Gastrointestinal:  Positive for abdominal pain and nausea. Negative for diarrhea and vomiting.  All other systems reviewed and are negative.  Physical Exam Updated Vital Signs BP 124/81 (BP Location: Left Arm)   Pulse 84   Temp 98 F (36.7 C) (Oral)   Resp 18   Ht _0  (1.626 m)   Wt 77.1 kg   SpO2 100%   BMI 29.18 kg/m   Physical Exam Vitals and nursing note reviewed.  Constitutional:      General: She is not in acute distress.    Appearance: Normal appearance. She is well-developed.  HENT:     Head: Normocephalic and atraumatic.  Eyes:     General: No scleral icterus.    Conjunctiva/sclera: Conjunctivae normal.  Cardiovascular:     Rate and Rhythm: Tachycardia present.     Heart sounds: Normal heart sounds.  Pulmonary:     Effort: Pulmonary effort is normal. No respiratory distress.     Breath sounds: No stridor.  Abdominal:     Palpations: Abdomen is soft.     Tenderness: There is abdominal tenderness. There is no guarding.     Comments: Tenderness to palpation the right upper quadrant  Musculoskeletal:        General: No deformity or signs of injury.     Cervical back: Normal range of motion.  Skin:    General: Skin is dry.     Coloration: Skin is not jaundiced or pale.  Neurological:     General: No focal deficit present.     Mental Status: She is alert and oriented to person, place, and time. Mental status is at baseline.  Psychiatric:        Mood and Affect: Mood normal.        Behavior: Behavior normal.     LABS (all labs ordered are listed, but only abnormal results are displayed)  Labs Reviewed  BASIC METABOLIC PANEL - Abnormal; Notable for the following components:      Result Value   Potassium 3.3 (*)    All other components within normal limits  D-DIMER, QUANTITATIVE (NOT AT [facility]) - Abnormal; Notable for the following components:   D-Dimer, Quant 0.71 (*)    All other components within normal limits  CBC  COMPREHENSIVE METABOLIC  PANEL  LIPASE, BLOOD  POC URINE PREG, ED  TROPONIN I (HIGH SENSITIVITY)  TROPONIN I (HIGH SENSITIVITY)   ____________________________________________  EKG  Sinus tachycardia, normal axis, normal intervals, no acute ischemic changes ____________________________________________  RADIOLOGY Almeta Monas, personally viewed and evaluated these images (plain radiographs) as part of my medical decision making, as well as reviewing the written report by the radiologist.  ED MD interpretation:  I reviewed the CXR which does not show any acute cardiopulmonary process      ____________________________________________   PROCEDURES  Procedure(s) performed (including Critical Care):  Procedures   ____________________________________________   INITIAL IMPRESSION / ASSESSMENT AND PLAN / ED COURSE     Patient is a 45 year old female presenting with chest pain x2 to 3 days.  Initial vital signs notable for mild tachycardia otherwise within normal limits.  She appears somewhat anxious on exam but overall well.  Lungs are clear.  She notably does have some right upper quadrant tenderness on exam and does endorse some abdominal pain since yesterday which she says is separate from her chest pain.  In terms of her chest pain it is pleuritic in nature, differential diagnosis includes pleurisy related to her prior viral infection but do need to consider pulmonary embolism as well.  She has no risk factors but unfortunately we cannot apply the [facility] rule given she is tachycardic.  We will send a D-dimer to further stratify.  We will also add on LFTs and lipase.  Patient notably does not have a gallbladder.   Dimer was positive so obtain a CT angio which is negative for acute PE.  Patient's LFTs and lipase are normal.  Suspect pleurisy related to recent viral syndrome.  Unclear what the etiology of her abdominal pain is but with her reassuring labs and history of cholecystectomy I have low suspicion  for acute biliary disease or other surgical process.  Advised the patient to follow-up with her PCP if she continues to have symptoms.  Clinical Course as of 08/24/21 1647  Mon Aug 24, 2021  1541 D-Dimer, Quant(!): 0.71 [KM]    Clinical Course User Index [KM] Rada Hay, MD     ____________________________________________   FINAL CLINICAL IMPRESSION(S) / ED DIAGNOSES  Final diagnoses:  Chest pain, unspecified type     ED Discharge Orders     None        Note:  This document was prepared using Dragon voice recognition software and may include unintentional dictation errors.    Rada Hay, MD 08/24/21 [phone]

## 2021-09-04 ENCOUNTER — Ambulatory Visit: Payer: 59 | Admitting: Surgical

## 2021-11-26 ENCOUNTER — Ambulatory Visit: Payer: 59 | Admitting: Orthopedic Surgery

## 2021-11-27 ENCOUNTER — Ambulatory Visit: Payer: 59 | Admitting: Surgical

## 2022-04-29 ENCOUNTER — Other Ambulatory Visit: Payer: Self-pay

## 2022-04-29 DIAGNOSIS — B009 Herpesviral infection, unspecified: Secondary | ICD-10-CM

## 2022-04-29 NOTE — Telephone Encounter (Signed)
Reached out to patient and advised office visit would be needed for refill request. Patient verbalized understanding. KW

## 2022-04-29 NOTE — Telephone Encounter (Signed)
[facility] pharmacy faxed over refill request for valtrex. Patient last had medication filled 04/10/21 and last office visit 02/10/21. Please advise if okay to fill? KW

## 2022-05-26 ENCOUNTER — Emergency Department: Payer: Managed Care, Other (non HMO)

## 2022-05-26 ENCOUNTER — Other Ambulatory Visit: Payer: Self-pay

## 2022-05-26 ENCOUNTER — Emergency Department
Admission: EM | Admit: 2022-05-26 | Discharge: 2022-05-26 | Disposition: A | Discharge status: Home / Self Care | Payer: Managed Care, Other (non HMO) | Attending: Emergency Medicine | Admitting: Emergency Medicine

## 2022-05-26 ENCOUNTER — Encounter: Payer: Self-pay | Admitting: Emergency Medicine

## 2022-05-26 DIAGNOSIS — I639 Cerebral infarction, unspecified: Secondary | ICD-10-CM | POA: Diagnosis not present

## 2022-05-26 DIAGNOSIS — J45909 Unspecified asthma, uncomplicated: Secondary | ICD-10-CM | POA: Diagnosis not present

## 2022-05-26 DIAGNOSIS — G43109 Migraine with aura, not intractable, without status migrainosus: Secondary | ICD-10-CM | POA: Diagnosis not present

## 2022-05-26 DIAGNOSIS — G43809 Other migraine, not intractable, without status migrainosus: Secondary | ICD-10-CM | POA: Diagnosis not present

## 2022-05-26 DIAGNOSIS — R799 Abnormal finding of blood chemistry, unspecified: Secondary | ICD-10-CM | POA: Insufficient documentation

## 2022-05-26 DIAGNOSIS — R519 Headache, unspecified: Secondary | ICD-10-CM | POA: Diagnosis present

## 2022-05-26 LAB — COMPREHENSIVE METABOLIC PANEL
ALT: 46 U/L — ABNORMAL HIGH (ref 0–44)
AST: 31 U/L (ref 15–41)
Albumin: 4.8 g/dL (ref 3.5–5.0)
Alkaline Phosphatase: 80 U/L (ref 38–126)
Anion gap: 8 (ref 5–15)
BUN: 11 mg/dL (ref 6–20)
CO2: 28 mmol/L (ref 22–32)
Calcium: 9.5 mg/dL (ref 8.9–10.3)
Chloride: 104 mmol/L (ref 98–111)
Creatinine, Ser: 0.8 mg/dL (ref 0.44–1.00)
GFR, Estimated: >60 mL/min (ref >60)
Glucose, Bld: 103 mg/dL — ABNORMAL HIGH (ref 70–99)
Potassium: 3.5 mmol/L (ref 3.5–5.1)
Sodium: 140 mmol/L (ref 135–145)
Total Bilirubin: 0.5 mg/dL (ref 0.3–1.2)
Total Protein: 8.3 g/dL — ABNORMAL HIGH (ref 6.5–8.1)

## 2022-05-26 LAB — CBC
HCT: 39.8 % (ref 36.0–46.0)
Hemoglobin: 12.9 g/dL (ref 12.0–15.0)
MCH: 27.1 pg (ref 26.0–34.0)
MCHC: 32.4 g/dL (ref 30.0–36.0)
MCV: 83.6 fL (ref 80.0–100.0)
Platelets: 279 10*3/uL (ref 150–400)
RBC: 4.76 MIL/uL (ref 3.87–5.11)
RDW: 14.3 % (ref 11.5–15.5)
WBC: 8.2 10*3/uL (ref 4.0–10.5)
nRBC: 0 % (ref 0.0–0.2)

## 2022-05-26 LAB — DIFFERENTIAL
Abs Immature Granulocytes: 0.02 10*3/uL (ref 0.00–0.07)
Basophils Absolute: 0 10*3/uL (ref 0.0–0.1)
Basophils Relative: 1 %
Eosinophils Absolute: 0.1 10*3/uL (ref 0.0–0.5)
Eosinophils Relative: 1 %
Immature Granulocytes: 0 %
Lymphocytes Relative: 37 %
Lymphs Abs: 3 10*3/uL (ref 0.7–4.0)
Monocytes Absolute: 0.4 10*3/uL (ref 0.1–1.0)
Monocytes Relative: 5 %
Neutro Abs: 4.6 10*3/uL (ref 1.7–7.7)
Neutrophils Relative %: 56 %

## 2022-05-26 LAB — APTT: aPTT: 26 seconds (ref 24–36)

## 2022-05-26 LAB — PROTIME-INR
INR: 1 (ref 0.8–1.2)
Prothrombin Time: 12.6 seconds (ref 11.4–15.2)

## 2022-05-26 LAB — ETHANOL: Alcohol, Ethyl (B): <10 mg/dL (ref <10)

## 2022-05-26 LAB — CBG MONITORING, ED: Glucose-Capillary: 85 mg/dL (ref 70–99)

## 2022-05-26 MED ORDER — LORAZEPAM 2 MG/ML IJ SOLN
1.0000 mg | Freq: Once | INTRAMUSCULAR | Status: AC
  Administered 2022-05-26: 1 mg via INTRAVENOUS
  Filled 2022-05-26: qty 1

## 2022-05-26 MED ORDER — DIPHENHYDRAMINE HCL 50 MG/ML IJ SOLN
50.0000 mg | Freq: Once | INTRAMUSCULAR | Status: AC
Start: 2022-05-26 — End: 2022-05-26
  Administered 2022-05-26: 50 mg via INTRAVENOUS
  Filled 2022-05-26: qty 1

## 2022-05-26 MED ORDER — SODIUM CHLORIDE 0.9 % IV BOLUS
1000.0000 mL | Freq: Once | INTRAVENOUS | Status: AC
  Administered 2022-05-26: 1000 mL via INTRAVENOUS

## 2022-05-26 MED ORDER — ACETAMINOPHEN 500 MG PO TABS
1000.0000 mg | ORAL_TABLET | Freq: Once | ORAL | Status: AC
  Administered 2022-05-26: 1000 mg via ORAL
  Filled 2022-05-26: qty 2

## 2022-05-26 MED ORDER — SODIUM CHLORIDE 0.9% FLUSH: 3.0000 mL | Freq: Once | INTRAVENOUS | Status: DC

## 2022-05-26 MED ORDER — GABAPENTIN 100 MG PO CAPS: 100.0000 mg | ORAL_CAPSULE | Freq: Two times a day (BID) | ORAL | 0 refills | Status: AC | PRN

## 2022-05-26 MED ORDER — METOCLOPRAMIDE HCL 5 MG/ML IJ SOLN
10.0000 mg | Freq: Once | INTRAMUSCULAR | Status: AC
Start: 2022-05-26 — End: 2022-05-26
  Administered 2022-05-26: 10 mg via INTRAVENOUS
  Filled 2022-05-26: qty 2

## 2022-05-26 MED ORDER — IOHEXOL 350 MG/ML SOLN
75.0000 mL | Freq: Once | INTRAVENOUS | Status: AC | PRN
  Administered 2022-05-26: 75 mL via INTRAVENOUS

## 2022-05-26 NOTE — Progress Notes (Signed)
  Chaplain On-Call responded to Code Stroke notification at 1205 hours.  At 1220 hours, the patient has not arrived to the ED.  Chaplain will refer to the Afternoon Chaplain whose shift begins at 1230.  Chaplain Evelena Peat M.Div., [facility]

## 2022-05-26 NOTE — Consult Note (Signed)
Neurology Consultation Reason for Consult: concern for stroke   CC: tingling  History is obtained from:patien  HPI: Andrea Hood is a 46 y.o. female with ahistory of migraines with visual spots who presents with tinglinginher bilateralhands,decerased r face sensation and occipital headache with photophobia.     LKW: 10:30 tpa given?: no, mild symptoms   Past Medical History:  Diagnosis Date   Asthma    Bipolar 2 disorder ([place])    BRCA negative 12/2019   MyRisk neg; IBIS=13.4%/riskscore=25.3%   Depression    Family history of breast cancer    Family history of ovarian cancer    Fatty liver    Gastric ulcer    Migraines    Pituitary cyst ([place])    Seizures ([place])      Family History  Problem Relation Age of Onset   Cervical cancer Mother 31   Ovarian cancer Mother 51   Breast cancer Maternal Grandmother 70   Breast cancer Maternal Aunt 35     Social History:  reports that she has quit smoking. Her smoking use included cigarettes. She smoked an average of .5 packs per day. She has never used smokeless tobacco. She reports that she does not currently use alcohol. She reports that she does not use drugs.   Exam: Current vital signs: BP 125/89 (BP Location: Right Arm)   Pulse 82   Temp 98 F (36.7 C) (Oral)   Resp 16   Ht _0  (1.6 m)   Wt 76.7 kg   SpO2 99%   BMI 29.94 kg/m  Vital signs in last 24 hours: Temp:  [98 F (36.7 C)] 98 F (36.7 C) (08/09 1154) Pulse Rate:  [82-96] 82 (08/09 1416) Resp:  [16] 16 (08/09 1416) BP: (120-140)/(78-101) 125/89 (08/09 1416) SpO2:  [98 %-100 %] 99 % (08/09 1416) Weight:  [76.7 kg] 76.7 kg (08/09 1156)   Physical Exam  Constitutional: Appears well-developed and well-nourished.  Psych: Affect appropriate to situation Eyes: No scleral injection HENT: No OP obstruction MSK: no joint deformities.  Cardiovascular: Normal rate and regular rhythm.  Respiratory: Effort normal, non-labored breathing GI: Soft.  No  distension. There is no tenderness.  Skin: WDI  Neuro: Mental Status: Patient is awake, alert, oriented to person, place, month, year, and situation. Patient is able to give a clear and coherent history. No signs of aphasia or neglect Cranial Nerves: II: Visual Fields are full. Pupils are equal, round, and reactive to light.   III,IV, VI: EOMI without ptosis or diploplia.  V: Facial sensation is symmetric to temperature VII: Facial movement is symmetric.  VIII: hearing is intact to voice X: Uvula elevates symmetrically XI: Shoulder shrug is symmetric. XII: tongue is midline without atrophy or fasciculations.  Motor: Tone is normal. Bulk is normal. 5/5 strength was present in all four extremities. , though her right leg drifts slightly Sensory: Sensation is diminished in the right face Cerebellar: FNF and HKS are intact bilaterally      I have reviewed labs in epic and the results pertinent to this consultation are: Cr 0.8  I have reviewed the images obtained:CT/CTA - negative  Impression: 46 yo F with tingling, numbness and headache. With positive symptoms, and unusual characteristics, my suspicion for ischemia is low,  but I do think furhter evaluation with MRI is prudent. If negative, would treat as complicated migraine.  Recommendations: 1) MRI brain   Roland Rack, MD [facility] [phone]  If 7pm- 7am, please page neurology on call  as listed in [place].

## 2022-05-26 NOTE — ED Notes (Signed)
Tele neuro chart activated by this RN.

## 2022-05-26 NOTE — Progress Notes (Signed)
Code stroke activated. Pt in CT upon activation.  

## 2022-05-26 NOTE — ED Notes (Signed)
Called Care link to initiate Code Stroke @ 1203 spoke to [facility] who will activate page

## 2022-05-26 NOTE — Progress Notes (Signed)
Pt returned from CT °

## 2022-05-26 NOTE — ED Provider Notes (Signed)
[facility] Provider Note    Event Date/Time   First MD Initiated Contact with Patient 05/26/22 1313     (approximate)  History   Chief Complaint: Headache and Numbness  HPI  Andrea Hood is a 46 y.o. female with a past medical history of bipolar, asthma, migraines, seizures, presents emergency department with complaints of a posterior headache trouble speaking as well as feeling like her body is "frozen."  Going to the patient symptoms started around 10:30 AM, patient is brought to the emergency department room as a code stroke as she was within the TNK window.  Patient was seen by myself as well as neurology.  Patient states a history of migraines but states that is been 10+ years since she has had a significant migraine.  Physical Exam   Triage Vital Signs: ED Triage Vitals  Enc Vitals Group     BP 05/26/22 1154 (!) 140/78     Pulse Rate 05/26/22 1154 96     Resp 05/26/22 1154 16     Temp 05/26/22 1154 98 F (36.7 C)     Temp Source 05/26/22 1154 Oral     SpO2 05/26/22 1154 100 %     Weight 05/26/22 1156 169 lb (76.7 kg)     Height 05/26/22 1156 5\' 3"  (1.6 m)     Head Circumference --      Peak Flow --      Pain Score 05/26/22 1156 9     Pain Loc --      Pain Edu? --      Excl. in GC? --     Most recent vital signs: Vitals:   05/26/22 1300 05/26/22 1416  BP: (!) 120/91 125/89  Pulse: 92 82  Resp:  16  Temp:    SpO2: 99% 99%    General: Awake, no distress.  Slightly slurred speech at times.  Otherwise no concerning findings on exam. CV:  Good peripheral perfusion.  Regular rate and rhythm  Resp:  Normal effort.  Equal breath sounds bilaterally.  Abd:  No distention.  Soft, nontender.  No rebound or guarding.    ED Results / Procedures / Treatments   EKG  EKG viewed and interpreted by myself shows sinus tachycardia 104 bpm with a narrow QRS, left axis deviation, largely normal intervals with no concerning ST  changes.  RADIOLOGY  CT scan of the head is negative for acute abnormality. I have personally reviewed the and interpreted the MRI imaging I do not see any obvious evaluation on T2 flair imaging.  MRI read as negative by radiology.  MEDICATIONS ORDERED IN ED: Medications  sodium chloride flush (NS) 0.9 % injection 3 mL (has no administration in time range)  iohexol (OMNIPAQUE) 350 MG/ML injection 75 mL (75 mLs Intravenous Contrast Given 05/26/22 1229)  LORazepam (ATIVAN) injection 1 mg (1 mg Intravenous Given 05/26/22 1313)  metoCLOPramide (REGLAN) injection 10 mg (10 mg Intravenous Given 05/26/22 1409)  diphenhydrAMINE (BENADRYL) injection 50 mg (50 mg Intravenous Given 05/26/22 1406)  acetaminophen (TYLENOL) tablet 1,000 mg (1,000 mg Oral Given 05/26/22 1402)  sodium chloride 0.9 % bolus 1,000 mL (1,000 mLs Intravenous New Bag/Given 05/26/22 1409)    IMPRESSION / MDM / ASSESSMENT AND PLAN / ED COURSE  I reviewed the triage vital signs and the nursing notes.  Patient's presentation is most consistent with acute presentation with potential threat to life or bodily function.  Patient presents to the emergency department as a code stroke  with difficulty speaking and feeling of being "frozen" not being able to move her body.  Patient was seen in conjunction with myself and neurology.  Patient is able to speak clearly in the emergency department every once in a while have some slightly slurred speech.  The remainder of the neurological exam is normal.  Patient CT scan is normal and MRI is normal.  Neurology feels symptoms are most consistent with a complicated migraine.  Patient has received migraine medication and she is feeling much better.  We will discharge with neurology follow-up.  Please see neurology note for NIH stroke scale.  The remainder the patient's medical workup was nonrevealing including a normal CBC normal chemistry.  Negative ethanol.  Patient is feeling better after medications and is  ready for discharge.  FINAL CLINICAL IMPRESSION(S) / ED DIAGNOSES   Complicated migraine  Rx / DC Orders   Gabapentin Neurology follow-up  Note:  This document was prepared using Dragon voice recognition software and may include unintentional dictation errors.   Minna Antis, MD 05/26/22 1553

## 2022-05-26 NOTE — ED Triage Notes (Signed)
Patient arrives ambulatory c/o bad headache in the base of her skull onset of earlier this morning (approx 10:30) while at work. C/o numbness and tingling between her shoulder blades and feels like she is having a hard time finding her words. Has decreased sensation to right side of face.

## 2023-04-15 DIAGNOSIS — G8929 Other chronic pain: Secondary | ICD-10-CM | POA: Diagnosis not present

## 2023-04-15 DIAGNOSIS — E6609 Other obesity due to excess calories: Secondary | ICD-10-CM | POA: Diagnosis not present

## 2023-04-15 DIAGNOSIS — M25562 Pain in left knee: Secondary | ICD-10-CM | POA: Diagnosis not present

## 2023-04-15 DIAGNOSIS — Z6833 Body mass index (BMI) 33.0-33.9, adult: Secondary | ICD-10-CM | POA: Diagnosis not present

## 2023-05-20 ENCOUNTER — Ambulatory Visit: Payer: Managed Care, Other (non HMO) | Admitting: Orthopedic Surgery

## 2023-07-30 ENCOUNTER — Emergency Department: Payer: Worker's Compensation

## 2023-07-30 ENCOUNTER — Emergency Department
Admission: EM | Admit: 2023-07-30 | Discharge: 2023-07-30 | Disposition: A | Discharge status: Home / Self Care | Payer: Worker's Compensation | Attending: Emergency Medicine | Admitting: Emergency Medicine

## 2023-07-30 ENCOUNTER — Other Ambulatory Visit: Payer: Self-pay

## 2023-07-30 DIAGNOSIS — X501XXA Overexertion from prolonged static or awkward postures, initial encounter: Secondary | ICD-10-CM | POA: Insufficient documentation

## 2023-07-30 DIAGNOSIS — M25512 Pain in left shoulder: Secondary | ICD-10-CM

## 2023-07-30 DIAGNOSIS — S4992XA Unspecified injury of left shoulder and upper arm, initial encounter: Secondary | ICD-10-CM | POA: Diagnosis present

## 2023-07-30 DIAGNOSIS — Y990 Civilian activity done for income or pay: Secondary | ICD-10-CM | POA: Diagnosis not present

## 2023-07-30 DIAGNOSIS — X58XXXA Exposure to other specified factors, initial encounter: Secondary | ICD-10-CM | POA: Insufficient documentation

## 2023-07-30 DIAGNOSIS — M5412 Radiculopathy, cervical region: Secondary | ICD-10-CM | POA: Insufficient documentation

## 2023-07-30 MED ORDER — HYDROCODONE-ACETAMINOPHEN 5-325 MG PO TABS: 1.0000 | ORAL_TABLET | Freq: Every day | ORAL | 0 refills | Status: AC | PRN

## 2023-07-30 MED ORDER — CYCLOBENZAPRINE HCL 5 MG PO TABS
5.0000 mg | ORAL_TABLET | Freq: Three times a day (TID) | ORAL | 0 refills | Status: AC | PRN
Start: 2023-07-30 — End: ?

## 2023-07-30 MED ORDER — HYDROCODONE-ACETAMINOPHEN 5-325 MG PO TABS
1.0000 | ORAL_TABLET | Freq: Once | ORAL | Status: AC
  Administered 2023-07-30: 1 via ORAL
  Filled 2023-07-30: qty 1

## 2023-07-30 NOTE — Discharge Instructions (Addendum)
You were evaluated in the ED for left shoulder pain.  Your x-rays are normal there are no broken bones or dislocations.  You will need to closely follow-up with orthopedics in the next few days for further evaluation.  Pick up pain medication from your pharmacy.  Do not take Tylenol with the pain medication described.  Limit your activity and elevate your arm above heart level frequently throughout the day.

## 2023-07-30 NOTE — ED Notes (Signed)
Pt A&O x4, no obvious distress noted, respirations regular/unlabored. Pt verbalizes understanding of discharge instructions. Pt able to ambulate from ED independently.   

## 2023-07-30 NOTE — ED Provider Notes (Signed)
[facility] Emergency Department Provider Note     Event Date/Time   First MD Initiated Contact with Patient 07/30/23 1401     (approximate)   History   Shoulder Injury   HPI  Andrea Hood is a 47 y.o. female presents to the ED with complaint of left shoulder pain x 2 days.  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  She describes pain as "burning" with radiation to her fourth and fifth fingers causing  tingling. Denies decreased range of motion.      Physical Exam   Triage Vital Signs: ED Triage Vitals  Encounter Vitals Group     BP 07/30/23 1257 129/64     Systolic BP Percentile --      Diastolic BP Percentile --      Pulse Rate 07/30/23 1257 99     Resp 07/30/23 1257 17     Temp 07/30/23 1257 98.6 F (37 C)     Temp Source 07/30/23 1257 Oral     SpO2 07/30/23 1257 98 %     Weight 07/30/23 1258 173 lb (78.5 kg)     Height 07/30/23 1258 5\' 4"  (1.626 m)     Head Circumference --      Peak Flow --      Pain Score 07/30/23 1258 8     Pain Loc --      Pain Education --      Exclude from Growth Chart --     Most recent vital signs: Vitals:   07/30/23 1257  BP: 129/64  Pulse: 99  Resp: 17  Temp: 98.6 F (37 C)  SpO2: 98%    General: Well appearing. Alert and oriented. INAD.   Head:  NCAT.  Eyes:  PERRLA. EOMI.  Neck:   No midline cervical spine tenderness to palpation. Full ROM without difficulty.  Tenderness with right neck rotation. (+) right Spurling test.  CV:  Good peripheral perfusion. RRR.  RESP:  Normal effort. LCTAB. No retractions.  BACK:  Spinous process is midline without deformity or tenderness. MSK:   Full ROM in all joints.  Left Shoulder abduction limited due to pain.  Tenderness with palpation over left scapula.  No swelling, deformity or tenderness.  NEURO: Cranial nerves intact. No focal deficits.  Sensation and motor function intact.  Neurovascular status intact.  ED Results / Procedures / Treatments   Labs (all labs ordered are listed, but only abnormal results are displayed) Labs Reviewed - No data to display  RADIOLOGY  I personally viewed and evaluated these images as part of my medical decision making, as well as reviewing the written report by the radiologist.  ED Provider Interpretation: Left shoulder x-ray appears normal no acute bony abnormalities noted.  DG Shoulder Left  Result Date: 07/30/2023 CLINICAL DATA:  Numbness, pain EXAM: LEFT SHOULDER - 2+ VIEW COMPARISON:  None Available. FINDINGS: No fracture or dislocation of the left shoulder. Mild glenohumeral arthrosis. Acromioclavicular joint is preserved. IMPRESSION: No fracture or dislocation of the left shoulder. Mild glenohumeral arthrosis. Electronically Signed   By: Jearld Lesch M.D.   On: 07/30/2023 14:09    PROCEDURES:  Critical Care performed: No  Procedures  MEDICATIONS ORDERED IN ED: Medications  HYDROcodone-acetaminophen (NORCO/VICODIN) 5-325 MG per tablet 1 tablet (1 tablet Oral Given 07/30/23 1420)   IMPRESSION / MDM / ASSESSMENT AND  PLAN / ED COURSE  I reviewed the triage vital signs and the nursing notes.                               47 y.o. female presents to the emergency department for evaluation and treatment of acute left shoulder pain. See HPI for further details.   Differential diagnosis includes, but is not limited to cervical radiculopathy, cubital tunnel syndrome, muscle strain, fracture less likely but considered.  Patient's presentation is most consistent with acute complicated illness / injury requiring diagnostic workup.  Patient is alert and oriented.  She is afebrile and hemodynamically stable.  No visible deformity noted to left shoulder.  Physical exam findings are stated above.  Left shoulder x-ray is reassuring. presentation is clinically consistent with cervical  radiculopathy given positive Spurling's test of reproduced pain.  Further evaluation and management advised.  Patient is encouraged to follow-up with orthopedic in 3 days.  She is in stable condition for discharge home. She is placed in a sling.  Work note provided.  ED precautions discussed. Patient verbalizes understanding. All questions and concerns were addressed during ED visit.     FINAL CLINICAL IMPRESSION(S) / ED DIAGNOSES   Final diagnoses:  Cervical radiculopathy  Acute pain of left shoulder   Rx / DC Orders   ED Discharge Orders          Ordered    HYDROcodone-acetaminophen (NORCO/VICODIN) 5-325 MG tablet  Daily PRN        07/30/23 1423    cyclobenzaprine (FLEXERIL) 5 MG tablet  3 times daily PRN        07/30/23 1429             Note:  This document was prepared using Dragon voice recognition software and may include unintentional dictation errors.    Romeo Apple, Kallie Depolo A, PA-C 07/30/23 1447    Sharman Cheek, MD 07/31/23 2310

## 2023-07-30 NOTE — ED Triage Notes (Signed)
Pt sts that she was at work pulling another pt and when she did so she felt a pop in her left shoulder. Pt sts that she has numbness running down her left arm and into the fingers.

## 2023-08-02 ENCOUNTER — Ambulatory Visit (INDEPENDENT_AMBULATORY_CARE_PROVIDER_SITE_OTHER): Payer: Worker's Compensation | Admitting: Orthopaedic Surgery

## 2023-08-02 ENCOUNTER — Encounter: Payer: Self-pay | Admitting: Physician Assistant

## 2023-08-02 DIAGNOSIS — M25512 Pain in left shoulder: Secondary | ICD-10-CM | POA: Insufficient documentation

## 2023-08-02 MED ORDER — PREDNISONE 10 MG (21) PO TBPK: ORAL_TABLET | ORAL | 3 refills | Status: AC

## 2023-08-02 NOTE — Progress Notes (Signed)
Office Visit Note   Patient: Andrea Hood           Date of Birth: 09-16-1976           MRN: [id number] Visit Date: 08/02/2023              Requested by: Jackson Latino, MD [address] [facility] [place],  [place] [address] PCP: Jackson Latino, MD   Assessment & Plan: Visit Diagnoses:  1. Acute pain of left shoulder     Plan: [name] is a 47 year old female with left shoulder pain impression is acute strain.  Low suspicion for structural abnormality.  Recommend prednisone and a course of outpatient physical therapy.  Work note provided for 6 weeks.  Recheck in 6 weeks.  Patient gets anaphylaxis with NSAIDs.  Total face to face encounter time was greater than 45 minutes and over half of this time was spent in counseling and/or coordination of care.   Follow-Up Instructions: Return in about 6 weeks (around 09/13/2023).   Orders:  No orders of the defined types were placed in this encounter.  Meds ordered this encounter  Medications   predniSONE (STERAPRED UNI-PAK 21 TAB) 10 MG (21) TBPK tablet    Sig: Take as directed    Dispense:  21 tablet    Refill:  3      Procedures: No procedures performed   Clinical Data: No additional findings.   Subjective: Chief Complaint  Patient presents with   Left Shoulder - Pain    HPI  Review of Systems  Constitutional: Negative.   HENT: Negative.    Eyes: Negative.   Respiratory: Negative.    Cardiovascular: Negative.   Endocrine: Negative.   Musculoskeletal: Negative.   Neurological: Negative.   Hematological: Negative.   Psychiatric/Behavioral: Negative.    All other systems reviewed and are negative.    Objective: Vital Signs: There were no vitals taken for this visit.  Physical Exam Vitals and nursing note reviewed.  Constitutional:      Appearance: Normal appearance. She is well-developed.  HENT:     Head: Atraumatic.     Nose: Nose normal.  Eyes:     Extraocular Movements:  Extraocular movements intact.  Cardiovascular:     Pulses: Normal pulses.  Pulmonary:     Effort: Pulmonary effort is normal.  Abdominal:     Palpations: Abdomen is soft.  Musculoskeletal:     Cervical back: Neck supple.  Skin:    General: Skin is warm and dry.     Capillary Refill: Capillary refill takes less than 2 seconds.  Neurological:     General: No focal deficit present.     Mental Status: She is alert and oriented to person, place, and time. Mental status is at baseline.  Psychiatric:        Mood and Affect: Mood normal.        Behavior: Behavior normal.        Thought Content: Thought content normal.        Judgment: Judgment normal.     Ortho Exam Exam of the left shoulder shows full passive range of motion with guarding with manual muscle testing of rotator cuff.  No focal findings.  Cervical spine exam is normal.  Negative Spurling's. Specialty Comments:  No specialty comments available.  Imaging: Outside x-rays independently reviewed and interpreted shows no acute or structural abnormalities.  Slight curvature to the acromion.   PMFS History: Patient Active Problem List  Diagnosis Date Noted   Pain in left shoulder 08/02/2023   Backache 11/19/2010   INSOMNIA-SLEEP DISORDER-UNSPEC 08/27/2010   Anxiety state 03/24/2009   EATING DISORDER, UNSPECIFIED 03/24/2009   DEPRESSION 03/24/2009   Migraine headache 03/24/2009   GERD 03/24/2009   Gastric ulcer 03/24/2009   FATIGUE 03/24/2009   Past Medical History:  Diagnosis Date   Asthma    Bipolar 2 disorder (HCC)    BRCA negative 12/2019   MyRisk neg; IBIS=13.4%/riskscore=25.3%   Depression    Family history of breast cancer    Family history of ovarian cancer    Fatty liver    Gastric ulcer    Migraines    Pituitary cyst (HCC)    Seizures (HCC)     Family History  Problem Relation Age of Onset   Cervical cancer Mother 89   Ovarian cancer Mother 3   Breast cancer Maternal Grandmother 23   Breast  cancer Maternal Aunt 35    Past Surgical History:  Procedure Laterality Date   ABDOMINAL HYSTERECTOMY     CHOLECYSTECTOMY     Social History   Occupational History   Not on file  Tobacco Use   Smoking status: Former    Current packs/day: 0.50    Types: Cigarettes   Smokeless tobacco: Never  Vaping Use   Vaping status: Never Used  Substance and Sexual Activity   Alcohol use: Not Currently   Drug use: No   Sexual activity: Yes    Birth control/protection: Surgical

## 2023-08-02 NOTE — Progress Notes (Signed)
Office Visit Note   Patient: Andrea Hood           Date of Birth: [birth year]           MRN: [id number] Visit Date: 08/02/2023              Requested by: Jackson Latino, MD [address] [facility] [place],  [place] [address] PCP: Jackson Latino, MD   Assessment & Plan: Visit Diagnoses:  1. Acute pain of left shoulder     Plan: Andrea Hood is a 47 year old female with acute left shoulder strain.  This sounds muscular in nature.  Have a very low suspicion for structural injury.  I recommend a period of rest and work restrictions.  I will place her on prednisone dose taper.  Patient gets anaphylaxis with NSAIDs.  I would like to recheck her in 6 weeks.  Total face to face encounter time was greater than 45 minutes and over half of this time was spent in counseling and/or coordination of care.   Follow-Up Instructions: Return in about 6 weeks (around 09/13/2023).   Orders:  No orders of the defined types were placed in this encounter.  Meds ordered this encounter  Medications   predniSONE (STERAPRED UNI-PAK 21 TAB) 10 MG (21) TBPK tablet    Sig: Take as directed    Dispense:  21 tablet    Refill:  3      Procedures: No procedures performed   Clinical Data: No additional findings.   Subjective: Chief Complaint  Patient presents with   Left Shoulder - Pain    HPI Andrea Hood is a 47 year old female here for evaluation of a Worker's Comp. left shoulder injury on 07/28/2023.  She states that she was lifting a patient at work and felt sharp stabbing pain that radiated down the scapula.  She reports some weakness and numbness and tingling in the ring and pinky finger.  Denies any true radicular symptoms.  She was in wearing a sling since she was evaluated the ER. Review of Systems  Constitutional: Negative.   HENT: Negative.    Eyes: Negative.   Respiratory: Negative.    Cardiovascular: Negative.   Endocrine: Negative.   Musculoskeletal: Negative.    Neurological: Negative.   Hematological: Negative.   Psychiatric/Behavioral: Negative.    All other systems reviewed and are negative.    Objective: Vital Signs: There were no vitals taken for this visit.  Physical Exam Vitals and nursing note reviewed.  Constitutional:      Appearance: She is well-developed.  HENT:     Head: Atraumatic.     Nose: Nose normal.  Eyes:     Extraocular Movements: Extraocular movements intact.  Cardiovascular:     Pulses: Normal pulses.  Pulmonary:     Effort: Pulmonary effort is normal.  Abdominal:     Palpations: Abdomen is soft.  Musculoskeletal:     Cervical back: Neck supple.  Skin:    General: Skin is warm.     Capillary Refill: Capillary refill takes less than 2 seconds.  Neurological:     Mental Status: She is alert. Mental status is at baseline.  Psychiatric:        Behavior: Behavior normal.        Thought Content: Thought content normal.        Judgment: Judgment normal.     Ortho Exam Exam of the left shoulder shows a significant guarding to passive range of motion.  She  has tenderness to palpation throughout the shoulder.  She has no focal motor or sensory deficits.  She has pain with any manual muscle testing. Specialty Comments:  No specialty comments available.  Imaging: No results found.   PMFS History: Patient Active Problem List   Diagnosis Date Noted   Pain in left shoulder 08/02/2023   Backache 11/19/2010   INSOMNIA-SLEEP DISORDER-UNSPEC 08/27/2010   Anxiety state 03/24/2009   EATING DISORDER, UNSPECIFIED 03/24/2009   DEPRESSION 03/24/2009   Migraine headache 03/24/2009   GERD 03/24/2009   Gastric ulcer 03/24/2009   FATIGUE 03/24/2009   Past Medical History:  Diagnosis Date   Asthma    Bipolar 2 disorder (HCC)    BRCA negative 12/2019   MyRisk neg; IBIS=13.4%/riskscore=25.3%   Depression    Family history of breast cancer    Family history of ovarian cancer    Fatty liver    Gastric ulcer     Migraines    Pituitary cyst (HCC)    Seizures (HCC)     Family History  Problem Relation Age of Onset   Cervical cancer Mother 33   Ovarian cancer Mother 24   Breast cancer Maternal Grandmother 24   Breast cancer Maternal Aunt 35    Past Surgical History:  Procedure Laterality Date   ABDOMINAL HYSTERECTOMY     CHOLECYSTECTOMY     Social History   Occupational History   Not on file  Tobacco Use   Smoking status: Former    Current packs/day: 0.50    Types: Cigarettes   Smokeless tobacco: Never  Vaping Use   Vaping status: Never Used  Substance and Sexual Activity   Alcohol use: Not Currently   Drug use: No   Sexual activity: Yes    Birth control/protection: Surgical

## 2023-08-09 ENCOUNTER — Encounter: Payer: Self-pay | Admitting: Orthopaedic Surgery

## 2023-08-09 ENCOUNTER — Other Ambulatory Visit: Payer: Self-pay | Admitting: Orthopaedic Surgery

## 2023-08-09 MED ORDER — TIZANIDINE HCL 4 MG PO TABS: 4.0000 mg | ORAL_TABLET | Freq: Four times a day (QID) | ORAL | 2 refills | Status: DC | PRN

## 2023-08-15 NOTE — Therapy (Signed)
OUTPATIENT PHYSICAL THERAPY EVALUATION   Patient Name: ZILLAH DUTTRY MRN: [id number] DOB:Nov 08, 1975, 47 y.o., female Today's Date: 08/16/2023  END OF SESSION:  PT End of Session - 08/16/23 1004     Visit Number 1    Number of Visits 20    Date for PT Re-Evaluation 10/25/23    PT Start Time 1019    PT Stop Time 1051    PT Time Calculation (min) 32 min    Activity Tolerance Patient tolerated treatment well    Behavior During Therapy [facility] for tasks assessed/performed             Past Medical History:  Diagnosis Date   Asthma    Bipolar 2 disorder (HCC)    BRCA negative 12/2019   MyRisk neg; IBIS=13.4%/riskscore=25.3%   Depression    Family history of breast cancer    Family history of ovarian cancer    Fatty liver    Gastric ulcer    Migraines    Pituitary cyst (HCC)    Seizures (HCC)    Past Surgical History:  Procedure Laterality Date   ABDOMINAL HYSTERECTOMY     CHOLECYSTECTOMY     Patient Active Problem List   Diagnosis Date Noted   Pain in left shoulder 08/02/2023   Backache 11/19/2010   INSOMNIA-SLEEP DISORDER-UNSPEC 08/27/2010   Anxiety state 03/24/2009   EATING DISORDER, UNSPECIFIED 03/24/2009   DEPRESSION 03/24/2009   Migraine headache 03/24/2009   GERD 03/24/2009   Gastric ulcer 03/24/2009   FATIGUE 03/24/2009    PCP: Jackson Latino MD  REFERRING PROVIDER: Tarry Kos, MD  REFERRING DIAG: Diagnosis M25.512 (ICD-10-CM) - Acute pain of left shoulder  THERAPY DIAG:  Acute pain of left shoulder  Cervicalgia  Muscle weakness (generalized)  Rationale for Evaluation and Treatment: Rehabilitation  ONSET DATE: 07/28/2023  SUBJECTIVE:                                                                                                                                                                                      SUBJECTIVE STATEMENT: Presented to the ED with complaint of left shoulder pain x 2 days on 07/30/2023.  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  She describes pain as "burning" with radiation to her fourth and fifth fingers causing tingling as described in ED visit.   Pt indicated having sharp pain that starts in Lt side of neck/superior shoulder.  Pt indicated constant pain symptoms in area that can sometimes go down into arm with numbness/tingling in hand at times.  Reported difficulty  c lifting/reaching and pulling things.  Difficulty sleeping reported. Denied vision changes/dizziness onset.    PERTINENT HISTORY: PMH: back pain,insomnia, depression,seisures,migranes, history of Rt shoulder surgery (clavicle excision)  PAIN:  NPRS scale: at worst 8/10, current 6/10, at best:  Pain location: Lt neck, shoulder, Lt arm pain Pain description: clawing, numbness/tingling.  Aggravating factors: reaching, lifting, constant symptoms, sleeping Relieving factors: heat, ice, medicine, rest  PRECAUTIONS: None  WEIGHT BEARING RESTRICTIONS: No  FALLS:  Has patient fallen in last 6 months? No  LIVING ENVIRONMENT: Lives in: House/apartment  OCCUPATION: MRI tech/assistant with patient positioning, wheelchair/bed movement.  (Hasn't work since initial pain  PLOF: Independent, walk, jogging, standing vibration machine.  Has dog.   PATIENT GOALS: Reduce pain, get back to work.    OBJECTIVE:   PATIENT SURVEYS:  08/16/2023 FOTO intake: 44    predicted:  61  COGNITION: 08/16/2023 Overall cognitive status: WFL     SENSATION: 08/16/2023 No specific testing today for dermatomes.   POSTURE: 08/16/2023 Increased tone noted Lt upper trap compared to Rt.  Mild forward head, rounded shoulders.   CERVICAL SCREEN 08/16/2023: Denied raidcular symptom changes.   ROM AROM (deg) 08/16/2023:  Flexion 50 c pain neck/superior shoulder  Extension 40  Right lateral flexion   Left lateral flexion   Right rotation 74 c  pain in neck/shoulder  Left rotation 70 no different symptoms   (Blank rows = not tested)   UPPER EXTREMITY ROM:   ROM Right 08/16/2023 Left 08/16/2023  Shoulder flexion WFL Approx. 100 deg against gravity c pain  Shoulder extension    Shoulder abduction    Shoulder adduction    Shoulder internal rotation    Shoulder external rotation    Elbow flexion    Elbow extension    Wrist flexion    Wrist extension    Wrist ulnar deviation    Wrist radial deviation    Wrist pronation    Wrist supination    (Blank rows = not tested)  UPPER EXTREMITY MMT:  08/16/2023:  No formal testing for Lt MMT due to pain/mobility restriction due to pain. All resisted static testing in neutral positioning was moderate force with pain noted.   MMT Right 08/16/2023 Left 08/16/2023  Shoulder flexion    Shoulder extension    Shoulder abduction    Shoulder adduction    Shoulder internal rotation    Shoulder external rotation    Middle trapezius    Lower trapezius    Elbow flexion    Elbow extension    Wrist flexion    Wrist extension    Wrist ulnar deviation    Wrist radial deviation    Wrist pronation    Wrist supination    Grip strength (lbs)    (Blank rows = not tested)  SPECIAL TESTS: 08/16/2023 (+) Spurling compression Lt and Rt for cervical/shoulder complaints (no change in distal arm symptoms (+) Cervical distraction for mild reduced Lt UE symptoms (-) drop arm Lt   JOINT MOBILITY TESTING:  08/16/2023 No specific testing today due to pain levels.   PALPATION:  08/16/2023 Trigger points and guarding noted in Lt upper trap, cervical paraspinals, levator scap, infraspinatus.  Concordant symptoms were noted locally and Lt infraspinatus did send symptoms into Lt UE.  TODAY'S TREATMENT:                                                                                                        DATE: 08/16/2023 Therex:    HEP instruction/performance c cues for techniques, handout provided.  See below for exercise list.  Performed during estim time conjunctly.  Manual Supine cervical intermittent distraction manually  Estim Pre mod Lt upper trap/superior shoulder to tolerance 10 mins    PATIENT EDUCATION: 08/16/2023 Education details: HEP, POC Person educated: Patient Education method: Programmer, multimedia, Demonstration, Verbal cues, and Handouts Education comprehension: verbalized understanding, returned demonstration, and verbal cues required  HOME EXERCISE PROGRAM: Access Code: [email or web address] URL: [email or web address] Date: 08/16/2023 Prepared by: Chyrel Masson  Exercises - Seated Scapular Retraction  - 3-5 x daily - 7 x weekly - 1 sets - 10 reps - 2-3 hold - Seated Cervical Retraction  - 2-3 x daily - 7 x weekly - 1 sets - 5-10 reps - 2-3 hold - Cervical Retraction at Wall  - 2-3 x daily - 7 x weekly - 1 sets - 5-10 reps - 5 hold - Seated Upper Trapezius Stretch (Mirrored)  - 2 x daily - 7 x weekly - 1 sets - 5 reps - 15 hold  ASSESSMENT:  CLINICAL IMPRESSION: Patient is a 47 y.o. who comes to clinic with complaints of Lt shoulder pain with mobility, strength and movement coordination deficits that impair their ability to perform usual daily and recreational functional activities without increase difficulty/symptoms at this time.  Patient to benefit from skilled PT services to address impairments and limitations to improve to previous level of function without restriction secondary to condition.   OBJECTIVE IMPAIRMENTS: decreased activity tolerance, decreased coordination, decreased endurance, decreased mobility, decreased ROM, decreased strength, increased fascial restrictions, impaired perceived functional ability, increased muscle spasms, impaired flexibility,  impaired UE functional use, improper body mechanics, postural dysfunction, and pain.   ACTIVITY LIMITATIONS: carrying, lifting, bending, sitting, standing, sleeping, bed mobility, bathing, toileting, dressing, self feeding, reach over head, hygiene/grooming, and caring for others  PARTICIPATION LIMITATIONS: meal prep, cleaning, laundry, interpersonal relationship, driving, shopping, community activity, and occupation  PERSONAL FACTORS:  PMH: back pain,insomnia, depression,seisures,migranes, history of Rt shoulder surgery (clavicle excision)  are also affecting patient's functional outcome.   REHAB POTENTIAL: Good  CLINICAL DECISION MAKING: Stable/uncomplicated  EVALUATION COMPLEXITY: Low   GOALS: Goals reviewed with patient? Yes  SHORT TERM GOALS: (target date for Short term goals are 3 weeks 09/06/2023)  1.Patient will demonstrate independent use of home exercise program to maintain progress from in clinic treatments. Goal status: New  LONG TERM GOALS: (target dates for all long term goals are 10 weeks  10/25/2023 )   1. Patient will demonstrate/report pain at worst less than or equal to 2/10 to facilitate minimal limitation in daily activity secondary to pain symptoms. Goal status: New   2. Patient will demonstrate independent use of home exercise program to facilitate ability to maintain/progress functional gains from skilled physical therapy services. Goal status: New   3. Patient will demonstrate FOTO outcome >  or = 61 % to indicate reduced disability due to condition. Goal status: New   4.  Patient will demonstrate Lt UE MMT 5/5 throughout to facilitate lifting, reaching, carrying at [facility] in daily activity.   Goal status: New   5.  Patient will demonstrate Lt [facility] joint AROM WFL s symptoms to facilitate usual overhead reaching, self care, dressing at PLOF.    Goal status: New   6.  Patient will demonstrate return to work at [employer] s limitation.  Goal status: New   7.   Patient will demonstrate cervical ROM WFL s symptoms to facilitate usual movement at PLOF.  Goal Status: New  PLAN:  PT FREQUENCY: 1-2x/week  PT DURATION: 10 weeks  PLANNED INTERVENTIONS: Can include 16109- PT Re-evaluation, 97110-Therapeutic exercises, 97530- Therapeutic activity, [id number]- Neuromuscular re-education, 97535- Self Care, 97140- Manual therapy, [phone]- Gait training, [phone]- Orthotic Fit/training, [phone]- Canalith repositioning, [id number]- Aquatic Therapy, 97014- Electrical stimulation (unattended), [id number]- Electrical stimulation (manual), [id number]- Vasopneumatic device, [id number]- Ultrasound, [id number]- Traction (mechanical), [id number]- Ionotophoresis 4mg /ml Dexamethasone, Patient/Family education, Balance training, Stair training, Taping, Dry Needling, Joint mobilization, Joint manipulation, Spinal manipulation, Spinal mobilization, Scar mobilization, Vestibular training, Visual/preceptual remediation/compensation, DME instructions, Cryotherapy, and Moist heat.  All performed as medically necessary.  All included unless contraindicated  PLAN FOR NEXT SESSION: Review HEP knowledge/results.   Cervical traction/distraction, possible myofascial release /dry needling option.    Check workers comp [employer].    Chyrel Masson, PT, DPT, OCS, ATC 08/16/23  11:24 AM

## 2023-08-16 ENCOUNTER — Other Ambulatory Visit: Payer: Self-pay

## 2023-08-16 ENCOUNTER — Ambulatory Visit (INDEPENDENT_AMBULATORY_CARE_PROVIDER_SITE_OTHER): Payer: Worker's Compensation | Admitting: Rehabilitative and Restorative Service Providers"

## 2023-08-16 ENCOUNTER — Encounter: Payer: Self-pay | Admitting: Rehabilitative and Restorative Service Providers"

## 2023-08-16 DIAGNOSIS — M542 Cervicalgia: Secondary | ICD-10-CM | POA: Diagnosis not present

## 2023-08-16 DIAGNOSIS — M25512 Pain in left shoulder: Secondary | ICD-10-CM | POA: Diagnosis not present

## 2023-08-16 DIAGNOSIS — M6281 Muscle weakness (generalized): Secondary | ICD-10-CM

## 2023-08-24 ENCOUNTER — Ambulatory Visit (INDEPENDENT_AMBULATORY_CARE_PROVIDER_SITE_OTHER): Payer: Worker's Compensation | Admitting: Physical Therapy

## 2023-08-24 ENCOUNTER — Encounter: Payer: Self-pay | Admitting: Physical Therapy

## 2023-08-24 DIAGNOSIS — M6281 Muscle weakness (generalized): Secondary | ICD-10-CM | POA: Diagnosis not present

## 2023-08-24 DIAGNOSIS — M25512 Pain in left shoulder: Secondary | ICD-10-CM | POA: Diagnosis not present

## 2023-08-24 DIAGNOSIS — M542 Cervicalgia: Secondary | ICD-10-CM | POA: Diagnosis not present

## 2023-08-24 NOTE — Therapy (Signed)
OUTPATIENT PHYSICAL THERAPY TREATMENT   Patient Name: Andrea Hood MRN: [id number] DOB:[birth year], 47 y.o., female Today's Date: 08/24/2023  END OF SESSION:  PT End of Session - 08/24/23 1016     Visit Number 2    Number of Visits 20    Date for PT Re-Evaluation 10/25/23    Authorization Type WC approved 12 visits    Authorization - Number of Visits 2    Progress Note Due on Visit 12    PT Start Time 1016    PT Stop Time 1054    PT Time Calculation (min) 38 min    Activity Tolerance Patient tolerated treatment well    Behavior During Therapy WFL for tasks assessed/performed              Past Medical History:  Diagnosis Date   Asthma    Bipolar 2 disorder (HCC)    BRCA negative 12/2019   MyRisk neg; IBIS=13.4%/riskscore=25.3%   Depression    Family history of breast cancer    Family history of ovarian cancer    Fatty liver    Gastric ulcer    Migraines    Pituitary cyst (HCC)    Seizures (HCC)    Past Surgical History:  Procedure Laterality Date   ABDOMINAL HYSTERECTOMY     CHOLECYSTECTOMY     Patient Active Problem List   Diagnosis Date Noted   Pain in left shoulder 08/02/2023   Backache 11/19/2010   INSOMNIA-SLEEP DISORDER-UNSPEC 08/27/2010   Anxiety state 03/24/2009   EATING DISORDER, UNSPECIFIED 03/24/2009   DEPRESSION 03/24/2009   Migraine headache 03/24/2009   GERD 03/24/2009   Gastric ulcer 03/24/2009   FATIGUE 03/24/2009    PCP: Jackson Latino MD  REFERRING PROVIDER: Tarry Kos, MD  REFERRING DIAG: Diagnosis M25.512 (ICD-10-CM) - Acute pain of left shoulder  THERAPY DIAG:  Cervicalgia  Acute pain of left shoulder  Muscle weakness (generalized)  Rationale for Evaluation and Treatment: Rehabilitation  ONSET DATE: 07/28/2023  SUBJECTIVE:                                                                                                                                                                                      SUBJECTIVE  STATEMENT: "I'm in a lot of pain today."  Interested in dry needling.   EVAL: Presented to the ED with complaint of left shoulder pain x 2 days on 07/30/2023.  Patient reports transferring a patient to a CT scan bed at work when she felt a pop in her left shoulder localized over the scapula region.  She describes pain as "burning" with radiation to her fourth and fifth fingers causing tingling as  described in ED visit.   Pt indicated having sharp pain that starts in Lt side of neck/superior shoulder.  Pt indicated constant pain symptoms in area that can sometimes go down into arm with numbness/tingling in hand at times.  Reported difficulty c lifting/reaching and pulling things.  Difficulty sleeping reported. Denied vision changes/dizziness onset.    PERTINENT HISTORY: PMH: back pain,insomnia, depression,seisures,migranes, history of Rt shoulder surgery (clavicle excision)  PAIN:  NPRS scale: at worst 8/10, current 6/10, at best:  Pain location: Lt neck, shoulder, Lt arm pain Pain description: clawing, numbness/tingling.  Aggravating factors: reaching, lifting, constant symptoms, sleeping Relieving factors: heat, ice, medicine, rest  PRECAUTIONS: None  WEIGHT BEARING RESTRICTIONS: No  FALLS:  Has patient fallen in last 6 months? No  LIVING ENVIRONMENT: Lives in: House/apartment  OCCUPATION: MRI tech/assistant with patient positioning, wheelchair/bed movement.  (Hasn't work since initial pain  PLOF: Independent, walk, jogging, standing vibration machine.  Has dog.   PATIENT GOALS: Reduce pain, get back to work.    OBJECTIVE:   PATIENT SURVEYS:  08/16/2023 FOTO intake: 44    predicted:  61  COGNITION: 08/16/2023 Overall cognitive status: WFL     SENSATION: 08/16/2023 No specific testing today for dermatomes.   POSTURE: 08/16/2023 Increased tone noted Lt upper trap compared to Rt.  Mild forward head, rounded shoulders.   CERVICAL SCREEN 08/16/2023: Denied  raidcular symptom changes.   ROM AROM (deg) 08/16/2023:  Flexion 50 c pain neck/superior shoulder  Extension 40  Right lateral flexion   Left lateral flexion   Right rotation 74 c pain in neck/shoulder  Left rotation 70 no different symptoms   (Blank rows = not tested)   UPPER EXTREMITY ROM:   ROM Right 08/16/2023 Left 08/16/2023  Shoulder flexion WFL Approx. 100 deg against gravity c pain  Shoulder extension    Shoulder abduction    Shoulder adduction    Shoulder internal rotation    Shoulder external rotation    Elbow flexion    Elbow extension    Wrist flexion    Wrist extension    Wrist ulnar deviation    Wrist radial deviation    Wrist pronation    Wrist supination    (Blank rows = not tested)  UPPER EXTREMITY MMT:  08/16/2023:  No formal testing for Lt MMT due to pain/mobility restriction due to pain. All resisted static testing in neutral positioning was moderate force with pain noted.   MMT Right 08/16/2023 Left 08/16/2023  Shoulder flexion    Shoulder extension    Shoulder abduction    Shoulder adduction    Shoulder internal rotation    Shoulder external rotation    Middle trapezius    Lower trapezius    Elbow flexion    Elbow extension    Wrist flexion    Wrist extension    Wrist ulnar deviation    Wrist radial deviation    Wrist pronation    Wrist supination    Grip strength (lbs)    (Blank rows = not tested)  SPECIAL TESTS: 08/16/2023 (+) Spurling compression Lt and Rt for cervical/shoulder complaints (no change in distal arm symptoms (+) Cervical distraction for mild reduced Lt UE symptoms (-) drop arm Lt   JOINT MOBILITY TESTING:  08/16/2023 No specific testing today due to pain levels.   PALPATION:  08/16/2023 Trigger points and guarding noted in Lt upper trap, cervical paraspinals, levator scap, infraspinatus.  Concordant symptoms were noted locally and Lt infraspinatus did send symptoms into  Lt UE.                                                                                                                                                                                                    TODAY'S TREATMENT DATE: 08/24/23 TherEx UBE L1 x 2 min  Manual STM with compression to Lt cervical paraspinals and upper trap; skilled palpation and monitoring of soft tissue during DN Trigger Point Dry-Needling  Treatment instructions: Expect mild to moderate muscle soreness. S/S of pneumothorax if dry needled over a lung field, and to seek immediate medical attention should they occur. Patient verbalized understanding of these instructions and education.  Patient Consent Given: Yes Education handout provided: Previously provided (provided at eval) Muscles treated: Lt cervical paraspinals; Lt upper trap Electrical stimulation performed: No Parameters: N/A Treatment response/outcome: twitch responses with reproduction of pain; increased soreness following DN  Modalities Premod to Lt neck and upper trap x 15 min with moist heat, intensity to tolerance    08/16/2023 Therex:    HEP instruction/performance c cues for techniques, handout provided.  See below for exercise list.  Performed during estim time conjunctly.  Manual Supine cervical intermittent distraction manually  Estim Pre mod Lt upper trap/superior shoulder to tolerance 10 mins    PATIENT EDUCATION: 08/16/2023 Education details: HEP, POC Person educated: Patient Education method: Programmer, multimedia, Demonstration, Verbal cues, and Handouts Education comprehension: verbalized understanding, returned demonstration, and verbal cues required  HOME EXERCISE PROGRAM: Access Code: [email or web address] URL: [email or web address] Date: 08/16/2023 Prepared by: Chyrel Masson  Exercises - Seated Scapular Retraction  - 3-5 x daily - 7 x weekly - 1 sets - 10 reps - 2-3 hold - Seated Cervical Retraction  - 2-3 x daily - 7 x weekly - 1 sets - 5-10 reps - 2-3  hold - Cervical Retraction at Wall  - 2-3 x daily - 7 x weekly - 1 sets - 5-10 reps - 5 hold - Seated Upper Trapezius Stretch (Mirrored)  - 2 x daily - 7 x weekly - 1 sets - 5 reps - 15 hold  ASSESSMENT:  CLINICAL IMPRESSION: Pt tolerated session well today with trial of manual therapy and DN.  No goals met as only 2nd visit; and will continue to benefit from PT to maximize function.   OBJECTIVE IMPAIRMENTS: decreased activity tolerance, decreased coordination, decreased endurance, decreased mobility, decreased ROM, decreased strength, increased fascial restrictions, impaired perceived functional ability, increased muscle spasms, impaired flexibility, impaired UE functional use, improper body mechanics, postural dysfunction, and pain.   ACTIVITY LIMITATIONS: carrying, lifting, bending, sitting, standing, sleeping, bed mobility, bathing, toileting,  dressing, self feeding, reach over head, hygiene/grooming, and caring for others  PARTICIPATION LIMITATIONS: meal prep, cleaning, laundry, interpersonal relationship, driving, shopping, community activity, and occupation  PERSONAL FACTORS:  PMH: back pain,insomnia, depression,seisures,migranes, history of Rt shoulder surgery (clavicle excision)  are also affecting patient's functional outcome.   REHAB POTENTIAL: Good  CLINICAL DECISION MAKING: Stable/uncomplicated  EVALUATION COMPLEXITY: Low   GOALS: Goals reviewed with patient? Yes  SHORT TERM GOALS: (target date for Short term goals are 3 weeks 09/06/2023)  1.Patient will demonstrate independent use of home exercise program to maintain progress from in clinic treatments. Goal status: New  LONG TERM GOALS: (target dates for all long term goals are 10 weeks  10/25/2023 )   1. Patient will demonstrate/report pain at worst less than or equal to 2/10 to facilitate minimal limitation in daily activity secondary to pain symptoms. Goal status: New   2. Patient will demonstrate independent use  of home exercise program to facilitate ability to maintain/progress functional gains from skilled physical therapy services. Goal status: New   3. Patient will demonstrate FOTO outcome > or = 61 % to indicate reduced disability due to condition. Goal status: New   4.  Patient will demonstrate Lt UE MMT 5/5 throughout to facilitate lifting, reaching, carrying at [facility] in daily activity.   Goal status: New   5.  Patient will demonstrate Lt [facility] joint AROM WFL s symptoms to facilitate usual overhead reaching, self care, dressing at PLOF.    Goal status: New   6.  Patient will demonstrate return to work at [employer] s limitation.  Goal status: New   7.  Patient will demonstrate cervical ROM WFL s symptoms to facilitate usual movement at PLOF.  Goal Status: New  PLAN:  PT FREQUENCY: 1-2x/week  PT DURATION: 10 weeks  PLANNED INTERVENTIONS: Can include 13086- PT Re-evaluation, 97110-Therapeutic exercises, 97530- Therapeutic activity, [id number]- Neuromuscular re-education, 97535- Self Care, 97140- Manual therapy, [phone]- Gait training, [phone]- Orthotic Fit/training, [phone]- Canalith repositioning, [id number]- Aquatic Therapy, 97014- Electrical stimulation (unattended), [id number]- Electrical stimulation (manual), [id number]- Vasopneumatic device, [id number]- Ultrasound, [id number]- Traction (mechanical), [id number]- Ionotophoresis 4mg /ml Dexamethasone, Patient/Family education, Balance training, Stair training, Taping, Dry Needling, Joint mobilization, Joint manipulation, Spinal manipulation, Spinal mobilization, Scar mobilization, Vestibular training, Visual/preceptual remediation/compensation, DME instructions, Cryotherapy, and Moist heat.  All performed as medically necessary.  All included unless contraindicated  PLAN FOR NEXT SESSION: how was DN, review/progress HEP as able,   Cervical traction/distraction PRN Check workers comp approval information.    Clarita Crane, PT, DPT 08/24/23 11:00 AM

## 2023-08-31 ENCOUNTER — Encounter: Payer: Self-pay | Admitting: Physical Therapy

## 2023-08-31 ENCOUNTER — Ambulatory Visit (INDEPENDENT_AMBULATORY_CARE_PROVIDER_SITE_OTHER): Payer: Worker's Compensation | Admitting: Physical Therapy

## 2023-08-31 DIAGNOSIS — M6281 Muscle weakness (generalized): Secondary | ICD-10-CM | POA: Diagnosis not present

## 2023-08-31 DIAGNOSIS — M25512 Pain in left shoulder: Secondary | ICD-10-CM

## 2023-08-31 DIAGNOSIS — M542 Cervicalgia: Secondary | ICD-10-CM

## 2023-08-31 NOTE — Therapy (Signed)
OUTPATIENT PHYSICAL THERAPY TREATMENT   Patient Name: Andrea Hood MRN: [id number] DOB:1976-01-29, 47 y.o., female Today's Date: 08/31/2023  END OF SESSION:  PT End of Session - 08/31/23 0836     Visit Number 3    Number of Visits 20    Date for PT Re-Evaluation 10/25/23    Authorization Type WC approved 12 visits    Authorization - Number of Visits 3    Progress Note Due on Visit 12    PT Start Time 0803    PT Stop Time 0843    PT Time Calculation (min) 40 min    Activity Tolerance Patient tolerated treatment well    Behavior During Therapy [facility] for tasks assessed/performed               Past Medical History:  Diagnosis Date   Asthma    Bipolar 2 disorder (HCC)    BRCA negative 12/2019   MyRisk neg; IBIS=13.4%/riskscore=25.3%   Depression    Family history of breast cancer    Family history of ovarian cancer    Fatty liver    Gastric ulcer    Migraines    Pituitary cyst (HCC)    Seizures (HCC)    Past Surgical History:  Procedure Laterality Date   ABDOMINAL HYSTERECTOMY     CHOLECYSTECTOMY     Patient Active Problem List   Diagnosis Date Noted   Pain in left shoulder 08/02/2023   Backache 11/19/2010   INSOMNIA-SLEEP DISORDER-UNSPEC 08/27/2010   Anxiety state 03/24/2009   EATING DISORDER, UNSPECIFIED 03/24/2009   DEPRESSION 03/24/2009   Migraine headache 03/24/2009   GERD 03/24/2009   Gastric ulcer 03/24/2009   FATIGUE 03/24/2009    PCP: Jackson Latino MD  REFERRING PROVIDER: Tarry Kos, MD  REFERRING DIAG: Diagnosis M25.512 (ICD-10-CM) - Acute pain of left shoulder  THERAPY DIAG:  Cervicalgia  Acute pain of left shoulder  Muscle weakness (generalized)  Rationale for Evaluation and Treatment: Rehabilitation  ONSET DATE: 07/28/2023  SUBJECTIVE:                                                                                                                                                                                       SUBJECTIVE STATEMENT: Pt feels DN helped but her pain has returned. Pt wishing to try DN again today.    EVAL: Presented to the ED with complaint of left shoulder pain x 2 days on 07/30/2023.  Patient reports transferring a patient to a CT scan bed at work when she felt a pop in her left shoulder localized over the scapula region.  She describes pain as "burning" with radiation to her fourth  and fifth fingers causing tingling as described in ED visit.   Pt indicated having sharp pain that starts in Lt side of neck/superior shoulder.  Pt indicated constant pain symptoms in area that can sometimes go down into arm with numbness/tingling in hand at times.  Reported difficulty c lifting/reaching and pulling things.  Difficulty sleeping reported. Denied vision changes/dizziness onset.    PERTINENT HISTORY: PMH: back pain,insomnia, depression,seisures,migranes, history of Rt shoulder surgery (clavicle excision)  PAIN:  NPRS scale: 6-7/10 pain  Pain location: Lt neck, shoulder, Lt arm pain Pain description: clawing, numbness/tingling.  Aggravating factors: reaching, lifting, constant symptoms, sleeping Relieving factors: heat, ice, medicine, rest  PRECAUTIONS: None  WEIGHT BEARING RESTRICTIONS: No  FALLS:  Has patient fallen in last 6 months? No  LIVING ENVIRONMENT: Lives in: House/apartment  OCCUPATION: MRI tech/assistant with patient positioning, wheelchair/bed movement.  (Hasn't work since initial pain  PLOF: Independent, walk, jogging, standing vibration machine.  Has dog.   PATIENT GOALS: Reduce pain, get back to work.    OBJECTIVE:   PATIENT SURVEYS:  08/16/2023 FOTO intake: 44    predicted:  61  COGNITION: 08/16/2023 Overall cognitive status: WFL     SENSATION: 08/16/2023 No specific testing today for dermatomes.   POSTURE: 08/16/2023 Increased tone noted Lt upper trap compared to Rt.  Mild forward head, rounded shoulders.   CERVICAL SCREEN 08/16/2023:  Denied raidcular symptom changes.   ROM AROM (deg) 08/16/2023:  Flexion 50 c pain neck/superior shoulder  Extension 40  Right lateral flexion   Left lateral flexion   Right rotation 74 c pain in neck/shoulder  Left rotation 70 no different symptoms   (Blank rows = not tested)   UPPER EXTREMITY ROM:   ROM Right 08/16/2023 Left 08/16/2023  Shoulder flexion WFL Approx. 100 deg against gravity c pain  Shoulder extension    Shoulder abduction    Shoulder adduction    Shoulder internal rotation    Shoulder external rotation    Elbow flexion    Elbow extension    Wrist flexion    Wrist extension    Wrist ulnar deviation    Wrist radial deviation    Wrist pronation    Wrist supination    (Blank rows = not tested)  UPPER EXTREMITY MMT:  08/16/2023:  No formal testing for Lt MMT due to pain/mobility restriction due to pain. All resisted static testing in neutral positioning was moderate force with pain noted.   MMT Right 08/16/2023 Left 08/16/2023  Shoulder flexion    Shoulder extension    Shoulder abduction    Shoulder adduction    Shoulder internal rotation    Shoulder external rotation    Middle trapezius    Lower trapezius    Elbow flexion    Elbow extension    Wrist flexion    Wrist extension    Wrist ulnar deviation    Wrist radial deviation    Wrist pronation    Wrist supination    Grip strength (lbs)    (Blank rows = not tested)  SPECIAL TESTS: 08/16/2023 (+) Spurling compression Lt and Rt for cervical/shoulder complaints (no change in distal arm symptoms (+) Cervical distraction for mild reduced Lt UE symptoms (-) drop arm Lt   JOINT MOBILITY TESTING:  08/16/2023 No specific testing today due to pain levels.   PALPATION:  08/16/2023 Trigger points and guarding noted in Lt upper trap, cervical paraspinals, levator scap, infraspinatus.  Concordant symptoms were noted locally and Lt infraspinatus did send symptoms  into Lt UE.                                                                                                                                                                                                    TODAY'S TREATMENT DATE: 08/31/23:  TherEx Upper trap stretch x 2 bil holding 10 sec Cervical rotation reviewed Manual STM with compression to Lt cervical paraspinals, left deltoid, bil suboccipitals, and left upper trap; skilled palpation and monitoring of soft tissue during DN STM using massage cream following DN Trigger Point Dry-Needling  Treatment instructions: Expect mild to moderate muscle soreness. S/S of pneumothorax if dry needled over a lung field, and to seek immediate medical attention should they occur. Patient verbalized understanding of these instructions and education.  Patient Consent Given: Yes Education handout provided: Previously provided (provided at eval) Muscles treated: Lt cervical paraspinals; Lt upper trap, left posterior deltoid, bil sub occipitials Electrical stimulation performed: No Parameters: N/A Treatment response/outcome: twitch responses with reproduction of pain; increased soreness following DN  Modalities Moist heat x 10 minutes at end of session to cervical spine and bil upper traps      08/24/23 TherEx UBE L1 x 2 min  Manual STM with compression to Lt cervical paraspinals and upper trap; skilled palpation and monitoring of soft tissue during DN Trigger Point Dry-Needling  Treatment instructions: Expect mild to moderate muscle soreness. S/S of pneumothorax if dry needled over a lung field, and to seek immediate medical attention should they occur. Patient verbalized understanding of these instructions and education.  Patient Consent Given: Yes Education handout provided: Previously provided (provided at eval) Muscles treated: Lt cervical paraspinals; Lt upper trap Electrical stimulation performed: No Parameters: N/A Treatment response/outcome: twitch responses with  reproduction of pain; increased soreness following DN  Modalities Premod to Lt neck and upper trap x 15 min with moist heat, intensity to tolerance    08/16/2023 Therex:    HEP instruction/performance c cues for techniques, handout provided.  See below for exercise list.  Performed during estim time conjunctly.  Manual Supine cervical intermittent distraction manually  Estim Pre mod Lt upper trap/superior shoulder to tolerance 10 mins    PATIENT EDUCATION: 08/16/2023 Education details: HEP, POC Person educated: Patient Education method: Programmer, multimedia, Demonstration, Verbal cues, and Handouts Education comprehension: verbalized understanding, returned demonstration, and verbal cues required  HOME EXERCISE PROGRAM: Access Code: [email or web address] URL: [email or web address] Date: 08/16/2023 Prepared by: Chyrel Masson  Exercises - Seated Scapular Retraction  - 3-5 x daily - 7 x weekly - 1 sets - 10 reps - 2-3 hold - Seated Cervical Retraction  -  2-3 x daily - 7 x weekly - 1 sets - 5-10 reps - 2-3 hold - Cervical Retraction at Wall  - 2-3 x daily - 7 x weekly - 1 sets - 5-10 reps - 5 hold - Seated Upper Trapezius Stretch (Mirrored)  - 2 x daily - 7 x weekly - 1 sets - 5 reps - 15 hold  ASSESSMENT:  CLINICAL IMPRESSION: Pt with good response to DN her last visit and wishing to perform again. Pt reporting more pain today upon arrival and STM added to pt's treatment this visit. Pt reporting less pain at end of session. Continue skilled PT interventions.    OBJECTIVE IMPAIRMENTS: decreased activity tolerance, decreased coordination, decreased endurance, decreased mobility, decreased ROM, decreased strength, increased fascial restrictions, impaired perceived functional ability, increased muscle spasms, impaired flexibility, impaired UE functional use, improper body mechanics, postural dysfunction, and pain.   ACTIVITY LIMITATIONS: carrying, lifting, bending, sitting, standing,  sleeping, bed mobility, bathing, toileting, dressing, self feeding, reach over head, hygiene/grooming, and caring for others  PARTICIPATION LIMITATIONS: meal prep, cleaning, laundry, interpersonal relationship, driving, shopping, community activity, and occupation  PERSONAL FACTORS:  PMH: back pain,insomnia, depression,seisures,migranes, history of Rt shoulder surgery (clavicle excision)  are also affecting patient's functional outcome.   REHAB POTENTIAL: Good  CLINICAL DECISION MAKING: Stable/uncomplicated  EVALUATION COMPLEXITY: Low   GOALS: Goals reviewed with patient? Yes  SHORT TERM GOALS: (target date for Short term goals are 3 weeks 09/06/2023)  1.Patient will demonstrate independent use of home exercise program to maintain progress from in clinic treatments. Goal status: New  LONG TERM GOALS: (target dates for all long term goals are 10 weeks  10/25/2023 )   1. Patient will demonstrate/report pain at worst less than or equal to 2/10 to facilitate minimal limitation in daily activity secondary to pain symptoms. Goal status: New   2. Patient will demonstrate independent use of home exercise program to facilitate ability to maintain/progress functional gains from skilled physical therapy services. Goal status: New   3. Patient will demonstrate FOTO outcome > or = 61 % to indicate reduced disability due to condition. Goal status: New   4.  Patient will demonstrate Lt UE MMT 5/5 throughout to facilitate lifting, reaching, carrying at [facility] in daily activity.   Goal status: New   5.  Patient will demonstrate Lt [facility] joint AROM WFL s symptoms to facilitate usual overhead reaching, self care, dressing at PLOF.    Goal status: New   6.  Patient will demonstrate return to work at [employer] s limitation.  Goal status: New   7.  Patient will demonstrate cervical ROM WFL s symptoms to facilitate usual movement at PLOF.  Goal Status: New  PLAN:  PT FREQUENCY: 1-2x/week  PT DURATION:  10 weeks  PLANNED INTERVENTIONS: Can include 59563- PT Re-evaluation, 97110-Therapeutic exercises, 97530- Therapeutic activity, [id number]- Neuromuscular re-education, 97535- Self Care, 97140- Manual therapy, [phone]- Gait training, [phone]- Orthotic Fit/training, [phone]- Canalith repositioning, [id number]- Aquatic Therapy, 97014- Electrical stimulation (unattended), [id number]- Electrical stimulation (manual), [id number]- Vasopneumatic device, [id number]- Ultrasound, [id number]- Traction (mechanical), [id number]- Ionotophoresis 4mg /ml Dexamethasone, Patient/Family education, Balance training, Stair training, Taping, Dry Needling, Joint mobilization, Joint manipulation, Spinal manipulation, Spinal mobilization, Scar mobilization, Vestibular training, Visual/preceptual remediation/compensation, DME instructions, Cryotherapy, and Moist heat.  All performed as medically necessary.  All included unless contraindicated  PLAN FOR NEXT SESSION: how was DN, review/progress HEP as able,    Manual as needed   Narda Deserea, PT, MPT 08/31/23 10:08 AM   08/31/23 10:08 AM

## 2023-09-02 ENCOUNTER — Ambulatory Visit (INDEPENDENT_AMBULATORY_CARE_PROVIDER_SITE_OTHER): Payer: Self-pay | Admitting: Rehabilitative and Restorative Service Providers"

## 2023-09-02 ENCOUNTER — Encounter: Payer: Self-pay | Admitting: Rehabilitative and Restorative Service Providers"

## 2023-09-02 DIAGNOSIS — M6281 Muscle weakness (generalized): Secondary | ICD-10-CM | POA: Diagnosis not present

## 2023-09-02 DIAGNOSIS — M25512 Pain in left shoulder: Secondary | ICD-10-CM

## 2023-09-02 DIAGNOSIS — M542 Cervicalgia: Secondary | ICD-10-CM

## 2023-09-02 NOTE — Therapy (Signed)
OUTPATIENT PHYSICAL THERAPY TREATMENT   Patient Name: Andrea Hood MRN: [id number] DOB:Feb 06, 1976, 47 y.o., female Today's Date: 09/02/2023  END OF SESSION:  PT End of Session - 09/02/23 0811     Visit Number 4    Number of Visits 20    Date for PT Re-Evaluation 10/25/23    Authorization Type WC approved 12 visits    Authorization - Visit Number 4    Authorization - Number of Visits 12    Progress Note Due on Visit 12    PT Start Time 0759    PT Stop Time 0838    PT Time Calculation (min) 39 min    Activity Tolerance Patient tolerated treatment well    Behavior During Therapy [facility] for tasks assessed/performed                Past Medical History:  Diagnosis Date   Asthma    Bipolar 2 disorder (HCC)    BRCA negative 12/2019   MyRisk neg; IBIS=13.4%/riskscore=25.3%   Depression    Family history of breast cancer    Family history of ovarian cancer    Fatty liver    Gastric ulcer    Migraines    Pituitary cyst (HCC)    Seizures (HCC)    Past Surgical History:  Procedure Laterality Date   ABDOMINAL HYSTERECTOMY     CHOLECYSTECTOMY     Patient Active Problem List   Diagnosis Date Noted   Pain in left shoulder 08/02/2023   Backache 11/19/2010   INSOMNIA-SLEEP DISORDER-UNSPEC 08/27/2010   Anxiety state 03/24/2009   EATING DISORDER, UNSPECIFIED 03/24/2009   DEPRESSION 03/24/2009   Migraine headache 03/24/2009   GERD 03/24/2009   Gastric ulcer 03/24/2009   FATIGUE 03/24/2009    PCP: Jackson Latino MD  REFERRING PROVIDER: Tarry Kos, MD  REFERRING DIAG: Diagnosis M25.512 (ICD-10-CM) - Acute pain of left shoulder  THERAPY DIAG:  Cervicalgia  Acute pain of left shoulder  Muscle weakness (generalized)  Rationale for Evaluation and Treatment: Rehabilitation  ONSET DATE: 07/28/2023  SUBJECTIVE:                                                                                                                                                                                       SUBJECTIVE STATEMENT: Pt feels DN helped but her pain has returned. Pt wishing to try DN again today.    EVAL: Presented to the ED with complaint of left shoulder pain x 2 days on 07/30/2023.  Patient reports transferring a patient to a CT scan bed at work when she felt a pop in her left shoulder localized over the scapula region.  She  describes pain as "burning" with radiation to her fourth and fifth fingers causing tingling as described in ED visit.   Pt indicated having sharp pain that starts in Lt side of neck/superior shoulder.  Pt indicated constant pain symptoms in area that can sometimes go down into arm with numbness/tingling in hand at times.  Reported difficulty c lifting/reaching and pulling things.  Difficulty sleeping reported. Denied vision changes/dizziness onset.    PERTINENT HISTORY: PMH: back pain,insomnia, depression,seisures,migranes, history of Rt shoulder surgery (clavicle excision)  PAIN:  NPRS scale: 6/10 current, 4/10 Pain location: Lt neck, shoulder, Lt arm pain Pain description: clawing, numbness/tingling.  Aggravating factors: reaching, lifting, constant symptoms, sleeping Relieving factors: heat, ice, medicine, rest  PRECAUTIONS: None  WEIGHT BEARING RESTRICTIONS: No  FALLS:  Has patient fallen in last 6 months? No  LIVING ENVIRONMENT: Lives in: House/apartment  OCCUPATION: MRI tech/assistant with patient positioning, wheelchair/bed movement.  (Hasn't work since initial pain  PLOF: Independent, walk, jogging, standing vibration machine.  Has dog.   PATIENT GOALS: Reduce pain, get back to work.    OBJECTIVE:   PATIENT SURVEYS:  08/16/2023 FOTO intake: 44    predicted:  61  COGNITION: 08/16/2023 Overall cognitive status: WFL     SENSATION: 08/16/2023 No specific testing today for dermatomes.   POSTURE: 08/16/2023 Increased tone noted Lt upper trap compared to Rt.  Mild forward head, rounded shoulders.    CERVICAL SCREEN 08/16/2023: Denied raidcular symptom changes.   ROM AROM (deg) 08/16/2023: 09/02/2023  Flexion 50 c pain neck/superior shoulder 40  Extension 40 45  Right lateral flexion    Left lateral flexion    Right rotation 74 c pain in neck/shoulder 70  Left rotation 70 no different symptoms 75   (Blank rows = not tested)   UPPER EXTREMITY ROM:   ROM Right 08/16/2023 Left 08/16/2023  Shoulder flexion WFL Approx. 100 deg against gravity c pain  Shoulder extension    Shoulder abduction    Shoulder adduction    Shoulder internal rotation    Shoulder external rotation    Elbow flexion    Elbow extension    Wrist flexion    Wrist extension    Wrist ulnar deviation    Wrist radial deviation    Wrist pronation    Wrist supination    (Blank rows = not tested)  UPPER EXTREMITY MMT:  08/16/2023:  No formal testing for Lt MMT due to pain/mobility restriction due to pain. All resisted static testing in neutral positioning was moderate force with pain noted.   MMT Right 08/16/2023 Left 08/16/2023  Shoulder flexion    Shoulder extension    Shoulder abduction    Shoulder adduction    Shoulder internal rotation    Shoulder external rotation    Middle trapezius    Lower trapezius    Elbow flexion    Elbow extension    Wrist flexion    Wrist extension    Wrist ulnar deviation    Wrist radial deviation    Wrist pronation    Wrist supination    Grip strength (lbs)    (Blank rows = not tested)  SPECIAL TESTS: 08/16/2023 (+) Spurling compression Lt and Rt for cervical/shoulder complaints (no change in distal arm symptoms (+) Cervical distraction for mild reduced Lt UE symptoms (-) drop arm Lt   JOINT MOBILITY TESTING:  08/16/2023 No specific testing today due to pain levels.   PALPATION:  08/16/2023 Trigger points and guarding noted in Lt upper trap, cervical  paraspinals, levator scap, infraspinatus.  Concordant symptoms were noted locally and Lt  infraspinatus did send symptoms into Lt UE.                                                                                                                                                                                                  TODAY'S TREATMENT                                                                    DATE:   09/02/2023:  Manual: Supine cervical intermittent distraction manually.  Sub occipital direct pressure.  Moist heat on Lt upper trap during.   Trigger Point Dry-Needling  Treatment instructions: Expect mild to moderate muscle soreness. S/S of pneumothorax if dry needled over a lung field, and to seek immediate medical attention should they occur. Patient verbalized understanding of these instructions and education.  Patient Consent Given: Yes Education handout provided: Previously provided (provided at eval) Muscles treated: Lt upper trap Electrical stimulation performed: No Parameters: N/A Treatment response/outcome: twitch responses with reproduction of pain; increased soreness following DN  Therex: UBE fwd 4 mins , backward 3 mins lvl 2.0 Seated scapular retraction 2-3 sec hold x 10 Seated Lt upper trap self stretch 15 sec x 3 Standing green band rows 2 x 10 bilaterally Cervical ROM check     TODAY'S TREATMENT                                                                    DATE:   08/31/23:  TherEx Upper trap stretch x 2 bil holding 10 sec Cervical rotation reviewed  Manual STM with compression to Lt cervical paraspinals, left deltoid, bil suboccipitals, and left upper trap; skilled palpation and monitoring of soft tissue during DN STM using massage cream following DN  Trigger Point Dry-Needling  Treatment instructions: Expect mild to moderate muscle soreness. S/S of pneumothorax if dry needled over a lung field, and to seek immediate medical attention should they occur. Patient verbalized understanding of these instructions and  education.  Patient Consent Given: Yes Education handout provided: Previously provided (provided at  eval) Muscles treated: Lt cervical paraspinals; Lt upper trap, left posterior deltoid, bil sub occipitials Electrical stimulation performed: No Parameters: N/A Treatment response/outcome: twitch responses with reproduction of pain; increased soreness following DN  Modalities Moist heat x 10 minutes at end of session to cervical spine and bil upper traps      TODAY'S TREATMENT                                                                    DATE:  08/24/23 TherEx UBE L1 x 2 min  Manual STM with compression to Lt cervical paraspinals and upper trap; skilled palpation and monitoring of soft tissue during DN Trigger Point Dry-Needling  Treatment instructions: Expect mild to moderate muscle soreness. S/S of pneumothorax if dry needled over a lung field, and to seek immediate medical attention should they occur. Patient verbalized understanding of these instructions and education.  Patient Consent Given: Yes Education handout provided: Previously provided (provided at eval) Muscles treated: Lt cervical paraspinals; Lt upper trap Electrical stimulation performed: No Parameters: N/A Treatment response/outcome: twitch responses with reproduction of pain; increased soreness following DN  Modalities Premod to Lt neck and upper trap x 15 min with moist heat, intensity to tolerance    TODAY'S TREATMENT                                                                    DATE:  08/16/2023 Therex:    HEP instruction/performance c cues for techniques, handout provided.  See below for exercise list.  Performed during estim time conjunctly.  Manual Supine cervical intermittent distraction manually  Estim Pre mod Lt upper trap/superior shoulder to tolerance 10 mins    PATIENT EDUCATION: 08/16/2023 Education details: HEP, POC Person educated: Patient Education method: Programmer, multimedia,  Demonstration, Verbal cues, and Handouts Education comprehension: verbalized understanding, returned demonstration, and verbal cues required  HOME EXERCISE PROGRAM: Access Code: [email or web address] URL: [email or web address] Date: 08/16/2023 Prepared by: Chyrel Masson  Exercises - Seated Scapular Retraction  - 3-5 x daily - 7 x weekly - 1 sets - 10 reps - 2-3 hold - Seated Cervical Retraction  - 2-3 x daily - 7 x weekly - 1 sets - 5-10 reps - 2-3 hold - Cervical Retraction at Wall  - 2-3 x daily - 7 x weekly - 1 sets - 5-10 reps - 5 hold - Seated Upper Trapezius Stretch (Mirrored)  - 2 x daily - 7 x weekly - 1 sets - 5 reps - 15 hold  ASSESSMENT:  CLINICAL IMPRESSION: Pt has reported some reduction of severity of symptoms at times since start of skilled PT services.  Presentation of guarded movements with symptoms related to cervical mobility still noted at this time.  Continued skilled PT services indicated to help address symptoms and progress mobility, activity tolerance to improve towards PLOF.  OBJECTIVE IMPAIRMENTS: decreased activity tolerance, decreased coordination, decreased endurance, decreased mobility, decreased ROM, decreased strength, increased fascial restrictions, impaired perceived functional ability, increased muscle spasms, impaired flexibility, impaired UE functional use, improper body  mechanics, postural dysfunction, and pain.   ACTIVITY LIMITATIONS: carrying, lifting, bending, sitting, standing, sleeping, bed mobility, bathing, toileting, dressing, self feeding, reach over head, hygiene/grooming, and caring for others  PARTICIPATION LIMITATIONS: meal prep, cleaning, laundry, interpersonal relationship, driving, shopping, community activity, and occupation  PERSONAL FACTORS:  PMH: back pain,insomnia, depression,seisures,migranes, history of Rt shoulder surgery (clavicle excision)  are also affecting patient's functional outcome.   REHAB POTENTIAL:  Good  CLINICAL DECISION MAKING: Stable/uncomplicated  EVALUATION COMPLEXITY: Low   GOALS: Goals reviewed with patient? Yes  SHORT TERM GOALS: (target date for Short term goals are 3 weeks 09/06/2023)  1.Patient will demonstrate independent use of home exercise program to maintain progress from in clinic treatments. Goal status: Met  LONG TERM GOALS: (target dates for all long term goals are 10 weeks  10/25/2023 )   1. Patient will demonstrate/report pain at worst less than or equal to 2/10 to facilitate minimal limitation in daily activity secondary to pain symptoms. Goal status: on going 09/02/2023   2. Patient will demonstrate independent use of home exercise program to facilitate ability to maintain/progress functional gains from skilled physical therapy services. Goal status: on going 09/02/2023   3. Patient will demonstrate FOTO outcome > or = 61 % to indicate reduced disability due to condition. Goal status: on going 09/02/2023   4.  Patient will demonstrate Lt UE MMT 5/5 throughout to facilitate lifting, reaching, carrying at [facility] in daily activity.   Goal status: on going 09/02/2023   5.  Patient will demonstrate Lt [facility] joint AROM WFL s symptoms to facilitate usual overhead reaching, self care, dressing at PLOF.    Goal status: on going 09/02/2023   6.  Patient will demonstrate return to work at [employer] s limitation.  Goal status: on going 09/02/2023   7.  Patient will demonstrate cervical ROM WFL s symptoms to facilitate usual movement at PLOF.  Goal Status: on going 09/02/2023  PLAN:  PT FREQUENCY: 1-2x/week  PT DURATION: 10 weeks  PLANNED INTERVENTIONS: Can include [id number]- PT Re-evaluation, 97110-Therapeutic exercises, 97530- Therapeutic activity, 97112- Neuromuscular re-education, 97535- Self Care, 97140- Manual therapy, [phone]- Gait training, [phone]- Orthotic Fit/training, [phone]- Canalith repositioning, [id number]- Aquatic Therapy, 97014- Electrical stimulation  (unattended), [id number]- Electrical stimulation (manual), [id number]- Vasopneumatic device, [id number]- Ultrasound, [id number]- Traction (mechanical), [id number]- Ionotophoresis 4mg /ml Dexamethasone, Patient/Family education, Balance training, Stair training, Taping, Dry Needling, Joint mobilization, Joint manipulation, Spinal manipulation, Spinal mobilization, Scar mobilization, Vestibular training, Visual/preceptual remediation/compensation, DME instructions, Cryotherapy, and Moist heat.  All performed as medically necessary.  All included unless contraindicated  PLAN FOR NEXT SESSION: DN as necessary/requested.  Progressive mobility improvements, strengthening for scapular and UE use improvements.    Chyrel Masson, PT, DPT, OCS, ATC 09/02/23  8:36 AM

## 2023-09-05 ENCOUNTER — Other Ambulatory Visit: Payer: Self-pay | Admitting: Physician Assistant

## 2023-09-05 ENCOUNTER — Ambulatory Visit (INDEPENDENT_AMBULATORY_CARE_PROVIDER_SITE_OTHER): Payer: Worker's Compensation | Admitting: Rehabilitative and Restorative Service Providers"

## 2023-09-05 ENCOUNTER — Encounter: Payer: Self-pay | Admitting: Rehabilitative and Restorative Service Providers"

## 2023-09-05 DIAGNOSIS — M542 Cervicalgia: Secondary | ICD-10-CM

## 2023-09-05 DIAGNOSIS — M6281 Muscle weakness (generalized): Secondary | ICD-10-CM | POA: Diagnosis not present

## 2023-09-05 DIAGNOSIS — M25512 Pain in left shoulder: Secondary | ICD-10-CM

## 2023-09-05 MED ORDER — TIZANIDINE HCL 4 MG PO TABS: 4.0000 mg | ORAL_TABLET | Freq: Two times a day (BID) | ORAL | 0 refills | Status: AC | PRN

## 2023-09-05 NOTE — Therapy (Signed)
OUTPATIENT PHYSICAL THERAPY TREATMENT   Patient Name: Andrea Hood MRN: [id number] DOB:May 12, 1976, 47 y.o., female Today's Date: 09/05/2023  END OF SESSION:  PT End of Session - 09/05/23 1052     Visit Number 5    Number of Visits 20    Date for PT Re-Evaluation 10/25/23    Authorization Type WC approved 12 visits    Authorization - Visit Number 5    Authorization - Number of Visits 12    Progress Note Due on Visit 12    PT Start Time 1053    PT Stop Time 1132    PT Time Calculation (min) 39 min    Activity Tolerance Patient limited by pain    Behavior During Therapy [facility] for tasks assessed/performed                 Past Medical History:  Diagnosis Date   Asthma    Bipolar 2 disorder (HCC)    BRCA negative 12/2019   MyRisk neg; IBIS=13.4%/riskscore=25.3%   Depression    Family history of breast cancer    Family history of ovarian cancer    Fatty liver    Gastric ulcer    Migraines    Pituitary cyst (HCC)    Seizures (HCC)    Past Surgical History:  Procedure Laterality Date   ABDOMINAL HYSTERECTOMY     CHOLECYSTECTOMY     Patient Active Problem List   Diagnosis Date Noted   Pain in left shoulder 08/02/2023   Backache 11/19/2010   INSOMNIA-SLEEP DISORDER-UNSPEC 08/27/2010   Anxiety state 03/24/2009   EATING DISORDER, UNSPECIFIED 03/24/2009   DEPRESSION 03/24/2009   Migraine headache 03/24/2009   GERD 03/24/2009   Gastric ulcer 03/24/2009   FATIGUE 03/24/2009    PCP: Jackson Latino MD  REFERRING PROVIDER: Tarry Kos, MD  REFERRING DIAG: Diagnosis M25.512 (ICD-10-CM) - Acute pain of left shoulder  THERAPY DIAG:  Cervicalgia  Acute pain of left shoulder  Muscle weakness (generalized)  Rationale for Evaluation and Treatment: Rehabilitation  ONSET DATE: 07/28/2023  SUBJECTIVE:                                                                                                                                                                                       SUBJECTIVE STATEMENT: Pt indicated having trouble of the weekend with same areas hurting more.  Pt indicated Friday after visit seemed to be improved some.    PERTINENT HISTORY: PMH: back pain,insomnia, depression,seisures,migranes, history of Rt shoulder surgery (clavicle excision)  PAIN:  NPRS scale: arrival 8/10.  Pain location: Lt neck, shoulder, Lt arm pain Pain description: clawing, numbness/tingling.  Aggravating factors: reaching,  lifting, constant symptoms, sleeping Relieving factors: heat, ice, medicine, rest  PRECAUTIONS: None  WEIGHT BEARING RESTRICTIONS: No  FALLS:  Has patient fallen in last 6 months? No  LIVING ENVIRONMENT: Lives in: House/apartment  OCCUPATION: MRI tech/assistant with patient positioning, wheelchair/bed movement.  (Hasn't work since initial pain  PLOF: Independent, walk, jogging, standing vibration machine.  Has dog.   PATIENT GOALS: Reduce pain, get back to work.    OBJECTIVE:   PATIENT SURVEYS:  08/16/2023 FOTO intake: 44    predicted:  61  COGNITION: 08/16/2023 Overall cognitive status: WFL     SENSATION: 08/16/2023 No specific testing today for dermatomes.   POSTURE: 08/16/2023 Increased tone noted Lt upper trap compared to Rt.  Mild forward head, rounded shoulders.   CERVICAL SCREEN 08/16/2023: Denied raidcular symptom changes.   ROM AROM (deg) 08/16/2023: 09/02/2023  Flexion 50 c pain neck/superior shoulder 40  Extension 40 45  Right lateral flexion    Left lateral flexion    Right rotation 74 c pain in neck/shoulder 70  Left rotation 70 no different symptoms 75   (Blank rows = not tested)   UPPER EXTREMITY ROM:   ROM Right 08/16/2023 Left 08/16/2023  Shoulder flexion WFL Approx. 100 deg against gravity c pain  Shoulder extension    Shoulder abduction    Shoulder adduction    Shoulder internal rotation    Shoulder external rotation    Elbow flexion    Elbow extension    Wrist  flexion    Wrist extension    Wrist ulnar deviation    Wrist radial deviation    Wrist pronation    Wrist supination    (Blank rows = not tested)  UPPER EXTREMITY MMT:  08/16/2023:  No formal testing for Lt MMT due to pain/mobility restriction due to pain. All resisted static testing in neutral positioning was moderate force with pain noted.   MMT Right 08/16/2023 Left 08/16/2023  Shoulder flexion    Shoulder extension    Shoulder abduction    Shoulder adduction    Shoulder internal rotation    Shoulder external rotation    Middle trapezius    Lower trapezius    Elbow flexion    Elbow extension    Wrist flexion    Wrist extension    Wrist ulnar deviation    Wrist radial deviation    Wrist pronation    Wrist supination    Grip strength (lbs)    (Blank rows = not tested)  SPECIAL TESTS: 08/16/2023 (+) Spurling compression Lt and Rt for cervical/shoulder complaints (no change in distal arm symptoms (+) Cervical distraction for mild reduced Lt UE symptoms (-) drop arm Lt   JOINT MOBILITY TESTING:  08/16/2023 No specific testing today due to pain levels.   PALPATION:  08/16/2023 Trigger points and guarding noted in Lt upper trap, cervical paraspinals, levator scap, infraspinatus.  Concordant symptoms were noted locally and Lt infraspinatus did send symptoms into Lt UE.  TODAY'S TREATMENT                                                                    DATE:   09/05/2023:  Manual: Prone cPA upper/mid thoracic g3, regional PA mid thoracic.  Performed with estim on.  Skilled palpation with dry needling.   Trigger Point Dry-Needling  Treatment instructions: Expect mild to moderate muscle soreness. S/S of pneumothorax if dry needled over a lung field, and to seek immediate  medical attention should they occur. Patient verbalized understanding of these instructions and education.  Patient Consent Given: Yes Education handout provided: Previously provided (provided at eval) Muscles treated: Lt upper trap, T4-T6 Lt multifidi, paraspinals.  Electrical stimulation performed: No Parameters: N/A Treatment response/outcome: twitch responses with reproduction of pain; increased soreness following DN  Modalities Premod to Lt neck and upper trap x 15 min with moist heat, intensity to tolerance in prone post needling (done during manual intervention time)  Therex: Prone scap retraction 5 sec hold x 10 Prone scap retraction c GH extension hold bilateral 5 sec hold x 10  Seated Lt upper trap stretch 15 sec x 3  Nustep Lvl 5 UE/LE 5 mins for general mobility  Backward shoulder rolls x 10  Forward small pball rolls up wall x 10 with both UE   TODAY'S TREATMENT                                                                    DATE:   09/02/2023:  Manual: Supine cervical intermittent distraction manually.  Sub occipital direct pressure.  Moist heat on Lt upper trap during.   Trigger Point Dry-Needling  Treatment instructions: Expect mild to moderate muscle soreness. S/S of pneumothorax if dry needled over a lung field, and to seek immediate medical attention should they occur. Patient verbalized understanding of these instructions and education.  Patient Consent Given: Yes Education handout provided: Previously provided (provided at eval) Muscles treated: Lt upper trap Electrical stimulation performed: No Parameters: N/A Treatment response/outcome: twitch responses with reproduction of pain; increased soreness following DN  Therex: UBE fwd 4 mins , backward 3 mins lvl 2.0 Seated scapular retraction 2-3 sec hold x 10 Seated Lt upper trap self stretch 15 sec x 3 Standing green band rows 2 x 10 bilaterally Cervical ROM check  TODAY'S TREATMENT                                                                     DATE:   08/31/23:  TherEx Upper trap stretch x 2 bil holding 10 sec Cervical rotation reviewed  Manual STM with compression to Lt cervical paraspinals, left deltoid, bil suboccipitals, and left upper trap; skilled palpation and monitoring of soft tissue during DN STM using massage cream following DN  Trigger Point Dry-Needling  Treatment instructions: Expect mild to moderate muscle soreness. S/S of pneumothorax if dry needled over a lung field, and to seek immediate medical attention should they occur. Patient verbalized understanding of these instructions and education.  Patient Consent Given: Yes Education handout provided: Previously provided (provided at eval) Muscles treated: Lt cervical paraspinals; Lt upper trap, left posterior deltoid, bil sub occipitials Electrical stimulation performed: No Parameters: N/A Treatment response/outcome: twitch responses with reproduction of pain; increased soreness following DN  Modalities Moist heat x 10 minutes at end of session to cervical spine and bil upper traps   TODAY'S TREATMENT                                                                    DATE:  08/24/23 TherEx UBE L1 x 2 min  Manual STM with compression to Lt cervical paraspinals and upper trap; skilled palpation and monitoring of soft tissue during DN Trigger Point Dry-Needling  Treatment instructions: Expect mild to moderate muscle soreness. S/S of pneumothorax if dry needled over a lung field, and to seek immediate medical attention should they occur. Patient verbalized understanding of these instructions and education.  Patient Consent Given: Yes Education handout provided: Previously provided (provided at eval) Muscles treated: Lt cervical paraspinals; Lt upper trap Electrical stimulation performed: No Parameters: N/A Treatment response/outcome: twitch responses with reproduction of pain; increased soreness following  DN  Modalities Premod to Lt neck and upper trap x 15 min with moist heat, intensity to tolerance   PATIENT EDUCATION: 08/16/2023 Education details: HEP, POC Person educated: Patient Education method: Programmer, multimedia, Facilities manager, Verbal cues, and Handouts Education comprehension: verbalized understanding, returned demonstration, and verbal cues required  HOME EXERCISE PROGRAM: Access Code: [email or web address] URL: [email or web address] Date: 08/16/2023 Prepared by: Chyrel Masson  Exercises - Seated Scapular Retraction  - 3-5 x daily - 7 x weekly - 1 sets - 10 reps - 2-3 hold - Seated Cervical Retraction  - 2-3 x daily - 7 x weekly - 1 sets - 5-10 reps - 2-3 hold - Cervical Retraction at Wall  - 2-3 x daily - 7 x weekly - 1 sets - 5-10 reps - 5 hold - Seated Upper Trapezius Stretch (Mirrored)  - 2 x daily - 7 x weekly - 1 sets - 5 reps - 15 hold  ASSESSMENT:  CLINICAL IMPRESSION: Due to elevated symptoms upon arrival, continued focus on manual, needling and modality use to reduce severity and irritability of symptoms.   Tolerance to active movements poor to fair overall continued at this time due to symptoms.  Continued skilled PT services indicated at this time to progress towards improved activity tolerance.   OBJECTIVE IMPAIRMENTS: decreased activity tolerance, decreased coordination, decreased endurance, decreased mobility, decreased ROM, decreased strength, increased fascial restrictions, impaired perceived functional ability, increased muscle spasms, impaired flexibility, impaired UE functional use, improper body mechanics, postural dysfunction, and pain.   ACTIVITY LIMITATIONS: carrying, lifting, bending, sitting, standing, sleeping, bed mobility, bathing, toileting, dressing, self feeding, reach over head, hygiene/grooming, and caring for others  PARTICIPATION LIMITATIONS: meal prep, cleaning, laundry, interpersonal relationship, driving, shopping, community activity, and  occupation  PERSONAL FACTORS:  PMH: back pain,insomnia, depression,seisures,migranes, history of Rt shoulder surgery (clavicle excision)  are also affecting patient's functional outcome.  REHAB POTENTIAL: Good  CLINICAL DECISION MAKING: Stable/uncomplicated  EVALUATION COMPLEXITY: Low   GOALS: Goals reviewed with patient? Yes  SHORT TERM GOALS: (target date for Short term goals are 3 weeks 09/06/2023)  1.Patient will demonstrate independent use of home exercise program to maintain progress from in clinic treatments. Goal status: Met  LONG TERM GOALS: (target dates for all long term goals are 10 weeks  10/25/2023 )   1. Patient will demonstrate/report pain at worst less than or equal to 2/10 to facilitate minimal limitation in daily activity secondary to pain symptoms. Goal status: on going 09/02/2023   2. Patient will demonstrate independent use of home exercise program to facilitate ability to maintain/progress functional gains from skilled physical therapy services. Goal status: on going 09/02/2023   3. Patient will demonstrate FOTO outcome > or = 61 % to indicate reduced disability due to condition. Goal status: on going 09/02/2023   4.  Patient will demonstrate Lt UE MMT 5/5 throughout to facilitate lifting, reaching, carrying at [facility] in daily activity.   Goal status: on going 09/02/2023   5.  Patient will demonstrate Lt [facility] joint AROM WFL s symptoms to facilitate usual overhead reaching, self care, dressing at PLOF.    Goal status: on going 09/02/2023   6.  Patient will demonstrate return to work at [facility] s limitation.  Goal status: on going 09/02/2023   7.  Patient will demonstrate cervical ROM WFL s symptoms to facilitate usual movement at PLOF.  Goal Status: on going 09/02/2023  PLAN:  PT FREQUENCY: 1-2x/week  PT DURATION: 10 weeks  PLANNED INTERVENTIONS: Can include 16109- PT Re-evaluation, 97110-Therapeutic exercises, 97530- Therapeutic activity, 97112-  Neuromuscular re-education, 97535- Self Care, 97140- Manual therapy, [phone]- Gait training, [phone]- Orthotic Fit/training, [phone]- Canalith repositioning, [id number]- Aquatic Therapy, 97014- Electrical stimulation (unattended), [id number]- Electrical stimulation (manual), [id number]- Vasopneumatic device, [id number]- Ultrasound, [id number]- Traction (mechanical), [id number]- Ionotophoresis 4mg /ml Dexamethasone, Patient/Family education, Balance training, Stair training, Taping, Dry Needling, Joint mobilization, Joint manipulation, Spinal manipulation, Spinal mobilization, Scar mobilization, Vestibular training, Visual/preceptual remediation/compensation, DME instructions, Cryotherapy, and Moist heat.  All performed as medically necessary.  All included unless contraindicated  PLAN FOR NEXT SESSION: Recheck arm strength.  Isometrics for shoulder added to general mobility.     Chyrel Masson, PT, DPT, OCS, ATC 09/05/23  11:33 AM

## 2023-09-07 ENCOUNTER — Encounter: Payer: Self-pay | Admitting: Rehabilitative and Restorative Service Providers"

## 2023-09-12 ENCOUNTER — Ambulatory Visit (INDEPENDENT_AMBULATORY_CARE_PROVIDER_SITE_OTHER): Payer: Worker's Compensation | Admitting: Rehabilitative and Restorative Service Providers"

## 2023-09-12 ENCOUNTER — Encounter: Payer: Self-pay | Admitting: Rehabilitative and Restorative Service Providers"

## 2023-09-12 DIAGNOSIS — M25512 Pain in left shoulder: Secondary | ICD-10-CM

## 2023-09-12 DIAGNOSIS — M542 Cervicalgia: Secondary | ICD-10-CM | POA: Diagnosis not present

## 2023-09-12 DIAGNOSIS — M6281 Muscle weakness (generalized): Secondary | ICD-10-CM

## 2023-09-12 NOTE — Therapy (Signed)
OUTPATIENT PHYSICAL THERAPY TREATMENT / PROGRESS NOTE   Patient Name: Andrea Hood MRN: [id number] DOB:02-12-1976, 47 y.o., female Today's Date: 09/12/2023  Progress Note Reporting Period 08/16/2023 to 09/12/2023  See note below for Objective Data and Assessment of Progress/Goals.      END OF SESSION:  PT End of Session - 09/12/23 0844     Visit Number 6    Number of Visits 20    Date for PT Re-Evaluation 10/25/23    Authorization Type WC approved 12 visits    Authorization - Number of Visits 12    Progress Note Due on Visit 12    PT Start Time 0846    PT Stop Time 0912    PT Time Calculation (min) 26 min    Activity Tolerance Patient limited by pain    Behavior During Therapy [facility] for tasks assessed/performed                  Past Medical History:  Diagnosis Date   Asthma    Bipolar 2 disorder (HCC)    BRCA negative 12/2019   MyRisk neg; IBIS=13.4%/riskscore=25.3%   Depression    Family history of breast cancer    Family history of ovarian cancer    Fatty liver    Gastric ulcer    Migraines    Pituitary cyst (HCC)    Seizures (HCC)    Past Surgical History:  Procedure Laterality Date   ABDOMINAL HYSTERECTOMY     CHOLECYSTECTOMY     Patient Active Problem List   Diagnosis Date Noted   Pain in left shoulder 08/02/2023   Backache 11/19/2010   INSOMNIA-SLEEP DISORDER-UNSPEC 08/27/2010   Anxiety state 03/24/2009   EATING DISORDER, UNSPECIFIED 03/24/2009   DEPRESSION 03/24/2009   Migraine headache 03/24/2009   GERD 03/24/2009   Gastric ulcer 03/24/2009   FATIGUE 03/24/2009    PCP: Jackson Latino MD  REFERRING PROVIDER: Tarry Kos, MD  REFERRING DIAG: Diagnosis M25.512 (ICD-10-CM) - Acute pain of left shoulder  THERAPY DIAG:  Cervicalgia  Acute pain of left shoulder  Muscle weakness (generalized)  Rationale for Evaluation and Treatment: Rehabilitation  ONSET DATE: 07/28/2023  SUBJECTIVE:                                                                                                                                                                                       SUBJECTIVE STATEMENT: Pt indicated increased pain in last 3 days or so.  Pt indicated arm pain shooting as well.   Pt indicated the couple days after last visit was pain but not as bad as now.  Reported 7/10 then.   Reported global  rating of change at quite a bit worse -5.    PERTINENT HISTORY: PMH: back pain,insomnia, depression,seisures,migranes, history of Rt shoulder surgery (clavicle excision)  PAIN:  NPRS scale: arrival 9/10.  Pain location: Lt neck, shoulder, Lt arm pain Pain description: clawing, numbness/tingling.  Aggravating factors: reaching, lifting, constant symptoms, sleeping Relieving factors: heat, ice, medicine, rest  PRECAUTIONS: None  WEIGHT BEARING RESTRICTIONS: No  FALLS:  Has patient fallen in last 6 months? No  LIVING ENVIRONMENT: Lives in: House/apartment  OCCUPATION: MRI tech/assistant with patient positioning, wheelchair/bed movement.  (Hasn't work since initial pain  PLOF: Independent, walk, jogging, standing vibration machine.  Has dog.   PATIENT GOALS: Reduce pain, get back to work.    OBJECTIVE:   PATIENT SURVEYS:  08/16/2023 FOTO intake: 44    predicted:  61  COGNITION: 08/16/2023 Overall cognitive status: WFL     SENSATION: 08/16/2023 No specific testing today for dermatomes.   POSTURE: 08/16/2023 Increased tone noted Lt upper trap compared to Rt.  Mild forward head, rounded shoulders.   CERVICAL SCREEN 08/16/2023: Denied raidcular symptom changes.   ROM AROM (deg) 08/16/2023: 09/02/2023 09/12/2023  Flexion 50 c pain neck/superior shoulder 40 20 c pain  Extension 40 45 35 c pain  Right lateral flexion     Left lateral flexion     Right rotation 74 c pain in neck/shoulder 70 50 c pain  Left rotation 70 no different symptoms 75 55 c pain but less than left   (Blank rows = not  tested)   UPPER EXTREMITY ROM:   ROM Right 08/16/2023 Left 08/16/2023 Left 09/12/2023  Shoulder flexion WFL Approx. 100 deg against gravity c pain 105 AROM against gravity with pain  Shoulder extension     Shoulder abduction     Shoulder adduction     Shoulder internal rotation     Shoulder external rotation     Elbow flexion     Elbow extension     Wrist flexion     Wrist extension     Wrist ulnar deviation     Wrist radial deviation     Wrist pronation     Wrist supination     (Blank rows = not tested)  UPPER EXTREMITY MMT:  09/12/2023:  No formal testing for Lt MMT due to pain/mobility restriction due to pain. All resisted static testing in neutral positioning was moderate force with pain noted.   08/16/2023:  No formal testing for Lt MMT due to pain/mobility restriction due to pain. All resisted static testing in neutral positioning was moderate force with pain noted.   MMT Right 08/16/2023 Left 08/16/2023  Shoulder flexion    Shoulder extension    Shoulder abduction    Shoulder adduction    Shoulder internal rotation    Shoulder external rotation    Middle trapezius    Lower trapezius    Elbow flexion    Elbow extension    Wrist flexion    Wrist extension    Wrist ulnar deviation    Wrist radial deviation    Wrist pronation    Wrist supination    Grip strength (lbs)    (Blank rows = not tested)  SPECIAL TESTS: 09/12/2023:  (+) Spurlings compression for cervical/shoulder complaints, Distraction worsened arm symptoms as well today when performed in supine.   08/16/2023 (+) Spurling compression Lt and Rt for cervical/shoulder complaints (no change in distal arm symptoms (+) Cervical distraction for mild reduced Lt UE symptoms (-) drop arm Lt  JOINT MOBILITY TESTING:  08/16/2023 No specific testing today due to pain levels.   PALPATION:  08/16/2023 Trigger points and guarding noted in Lt upper trap, cervical paraspinals, levator scap,  infraspinatus.  Concordant symptoms were noted locally and Lt infraspinatus did send symptoms into Lt UE.                                                                                                                                                                                                  TODAY'S TREATMENT                                                                    DATE:   09/12/2023:  Manual: Supine cervical distraction intermittent manually (stopped due to arm symptoms increase after several minutes)  Therex Supine cervical retraction isometric into table 5 sec hold x 10  Modalities Premod to Lt neck and upper trap x 10 min with moist heat in sitting     TODAY'S TREATMENT                                                                    DATE:   09/05/2023:  Manual: Prone cPA upper/mid thoracic g3, regional PA mid thoracic.  Performed with estim on.  Skilled palpation with dry needling.   Trigger Point Dry-Needling  Treatment instructions: Expect mild to moderate muscle soreness. S/S of pneumothorax if dry needled over a lung field, and to seek immediate medical attention should they occur. Patient verbalized understanding of these instructions and education.  Patient Consent Given: Yes Education handout provided: Previously provided (provided at eval) Muscles treated: Lt upper trap, T4-T6 Lt multifidi, paraspinals.  Electrical stimulation performed: No Parameters: N/A Treatment response/outcome: twitch responses with reproduction of pain; increased soreness following DN  Modalities Premod to Lt neck and upper trap x 15 min with moist heat, intensity to tolerance in prone post needling (done during manual intervention time)  Therex: Prone scap retraction 5 sec hold x 10 Prone scap retraction c GH extension hold bilateral 5 sec hold x 10  Seated Lt upper trap stretch 15 sec x  3  Nustep Lvl 5 UE/LE 5 mins for general mobility  Backward shoulder rolls x 10   Forward small pball rolls up wall x 10 with both UE   TODAY'S TREATMENT                                                                    DATE:   09/02/2023:  Manual: Supine cervical intermittent distraction manually.  Sub occipital direct pressure.  Moist heat on Lt upper trap during.   Trigger Point Dry-Needling  Treatment instructions: Expect mild to moderate muscle soreness. S/S of pneumothorax if dry needled over a lung field, and to seek immediate medical attention should they occur. Patient verbalized understanding of these instructions and education.  Patient Consent Given: Yes Education handout provided: Previously provided (provided at eval) Muscles treated: Lt upper trap Electrical stimulation performed: No Parameters: N/A Treatment response/outcome: twitch responses with reproduction of pain; increased soreness following DN  Therex: UBE fwd 4 mins , backward 3 mins lvl 2.0 Seated scapular retraction 2-3 sec hold x 10 Seated Lt upper trap self stretch 15 sec x 3 Standing green band rows 2 x 10 bilaterally Cervical ROM check  TODAY'S TREATMENT                                                                    DATE:   08/31/23:  TherEx Upper trap stretch x 2 bil holding 10 sec Cervical rotation reviewed  Manual STM with compression to Lt cervical paraspinals, left deltoid, bil suboccipitals, and left upper trap; skilled palpation and monitoring of soft tissue during DN STM using massage cream following DN  Trigger Point Dry-Needling  Treatment instructions: Expect mild to moderate muscle soreness. S/S of pneumothorax if dry needled over a lung field, and to seek immediate medical attention should they occur. Patient verbalized understanding of these instructions and education.  Patient Consent Given: Yes Education handout provided: Previously provided (provided at eval) Muscles treated: Lt cervical paraspinals; Lt upper trap, left posterior deltoid, bil sub  occipitials Electrical stimulation performed: No Parameters: N/A Treatment response/outcome: twitch responses with reproduction of pain; increased soreness following DN  Modalities Moist heat x 10 minutes at end of session to cervical spine and bil upper traps    PATIENT EDUCATION: 08/16/2023 Education details: HEP, POC Person educated: Patient Education method: Programmer, multimedia, Facilities manager, Verbal cues, and Handouts Education comprehension: verbalized understanding, returned demonstration, and verbal cues required  HOME EXERCISE PROGRAM: Access Code: [email or web address] URL: [email or web address] Date: 08/16/2023 Prepared by: Chyrel Masson  Exercises - Seated Scapular Retraction  - 3-5 x daily - 7 x weekly - 1 sets - 10 reps - 2-3 hold - Seated Cervical Retraction  - 2-3 x daily - 7 x weekly - 1 sets - 5-10 reps - 2-3 hold - Cervical Retraction at Wall  - 2-3 x daily - 7 x weekly - 1 sets - 5-10 reps - 5 hold - Seated Upper Trapezius Stretch (Mirrored)  - 2 x daily - 7 x weekly - 1 sets -  5 reps - 15 hold  ASSESSMENT:  CLINICAL IMPRESSION: Pt has attended 6 visits overall during course of treatment without having any substantial improvement in pain presentation/limitations.  Cervical mobility recheck today was reduced mobility with increased pain compared to previous measurements due to arrival symptoms elevated.  Global rating of change -5 quite a bit worse at this time.  Due to presentation, recommend hold of skilled PT services with return to MD tomorrow for reassessment.  May continue skilled PT in future pending MD recommendations.   At this time, cervical/shoulder involvement noted in clinical presentation.   OBJECTIVE IMPAIRMENTS: decreased activity tolerance, decreased coordination, decreased endurance, decreased mobility, decreased ROM, decreased strength, increased fascial restrictions, impaired perceived functional ability, increased muscle spasms, impaired  flexibility, impaired UE functional use, improper body mechanics, postural dysfunction, and pain.   ACTIVITY LIMITATIONS: carrying, lifting, bending, sitting, standing, sleeping, bed mobility, bathing, toileting, dressing, self feeding, reach over head, hygiene/grooming, and caring for others  PARTICIPATION LIMITATIONS: meal prep, cleaning, laundry, interpersonal relationship, driving, shopping, community activity, and occupation  PERSONAL FACTORS:  PMH: back pain,insomnia, depression,seisures,migranes, history of Rt shoulder surgery (clavicle excision)  are also affecting patient's functional outcome.   REHAB POTENTIAL: Good  CLINICAL DECISION MAKING: Stable/uncomplicated  EVALUATION COMPLEXITY: Low   GOALS: Goals reviewed with patient? Yes  SHORT TERM GOALS: (target date for Short term goals are 3 weeks 09/06/2023)  1.Patient will demonstrate independent use of home exercise program to maintain progress from in clinic treatments. Goal status: Met  LONG TERM GOALS: (target dates for all long term goals are 10 weeks  10/25/2023 )   1. Patient will demonstrate/report pain at worst less than or equal to 2/10 to facilitate minimal limitation in daily activity secondary to pain symptoms. Goal status: on going 09/02/2023   2. Patient will demonstrate independent use of home exercise program to facilitate ability to maintain/progress functional gains from skilled physical therapy services. Goal status: on going 09/02/2023   3. Patient will demonstrate FOTO outcome > or = 61 % to indicate reduced disability due to condition. Goal status: on going 09/02/2023   4.  Patient will demonstrate Lt UE MMT 5/5 throughout to facilitate lifting, reaching, carrying at [facility] in daily activity.   Goal status: on going 09/02/2023   5.  Patient will demonstrate Lt [facility] joint AROM WFL s symptoms to facilitate usual overhead reaching, self care, dressing at PLOF.    Goal status: on going 09/02/2023   6.   Patient will demonstrate return to work at [employer] [facility] s limitation.  Goal status: on going 09/02/2023   7.  Patient will demonstrate cervical ROM WFL s symptoms to facilitate usual movement at PLOF.  Goal Status: on going 09/02/2023  PLAN:  PT FREQUENCY: 1-2x/week  PT DURATION: 10 weeks  PLANNED INTERVENTIONS: Can include 96295- PT Re-evaluation, 97110-Therapeutic exercises, 97530- Therapeutic activity, 97112- Neuromuscular re-education, 97535- Self Care, 97140- Manual therapy, [phone]- Gait training, [phone]- Orthotic Fit/training, [phone]- Canalith repositioning, [id number]- Aquatic Therapy, 97014- Electrical stimulation (unattended), [id number]- Electrical stimulation (manual), [id number]- Vasopneumatic device, [id number]- Ultrasound, [id number]- Traction (mechanical), [id number]- Ionotophoresis 4mg /ml Dexamethasone, Patient/Family education, Balance training, Stair training, Taping, Dry Needling, Joint mobilization, Joint manipulation, Spinal manipulation, Spinal mobilization, Scar mobilization, Vestibular training, Visual/preceptual remediation/compensation, DME instructions, Cryotherapy, and Moist heat.  All performed as medically necessary.  All included unless contraindicated  PLAN FOR NEXT SESSION: Return to MD office. Hold PT pending MD recommendations.    Chyrel Masson, PT, DPT, OCS, ATC 09/12/23  9:07 AM

## 2023-09-13 ENCOUNTER — Ambulatory Visit: Payer: Self-pay | Admitting: Orthopaedic Surgery

## 2023-09-13 ENCOUNTER — Encounter: Payer: Self-pay | Admitting: Orthopaedic Surgery

## 2023-09-13 ENCOUNTER — Ambulatory Visit (INDEPENDENT_AMBULATORY_CARE_PROVIDER_SITE_OTHER): Payer: Worker's Compensation | Admitting: Orthopaedic Surgery

## 2023-09-13 ENCOUNTER — Other Ambulatory Visit (INDEPENDENT_AMBULATORY_CARE_PROVIDER_SITE_OTHER): Payer: Self-pay

## 2023-09-13 DIAGNOSIS — M5412 Radiculopathy, cervical region: Secondary | ICD-10-CM

## 2023-09-13 DIAGNOSIS — M25512 Pain in left shoulder: Secondary | ICD-10-CM | POA: Diagnosis not present

## 2023-09-13 NOTE — Progress Notes (Addendum)
`  Office Visit Note   Patient: Andrea Hood           Date of Birth: 06-04-1976           MRN: [id number] Visit Date: 09/13/2023              Requested by: Jackson Latino, MD [address] [facility] [place],  [place] [address] PCP: Jackson Latino, MD   Assessment & Plan: Visit Diagnoses:  1. Acute pain of left shoulder   2. Cervical radiculopathy     Plan: [name] is a 47 year old female with Worker's Comp. injury on the left shoulder.  She has been to physical therapy for the last 6 weeks and has not had significant improvement.  Difficult to say if her symptoms are coming from the neck or the shoulder therefore we will order an MRI of the cervical spine and the shoulder.  Work note for desk duty was provided.  Follow-up after the MRI.  Case manager present today.  Total face to face encounter time was greater than 25 minutes and over half of this time was spent in counseling and/or coordination of care.  Follow-Up Instructions: No follow-ups on file.   Orders:  Orders Placed This Encounter  Procedures   XR Cervical Spine 2 or 3 views   MR Cervical Spine w/o contrast   MR Shoulder Left w/o contrast   No orders of the defined types were placed in this encounter.     Procedures: No procedures performed   Clinical Data: No additional findings.   Subjective: Chief Complaint  Patient presents with   Left Shoulder - Pain    HPI Andrea Hood returns today for follow-up evaluation of left shoulder pain.  She has been to physical therapy for about 6 sessions and feels slight improvement but not significant.  Dry needling aggravated her symptoms.  She reports pain that radiates down her arm.  Review of Systems  Constitutional: Negative.   HENT: Negative.    Eyes: Negative.   Respiratory: Negative.    Cardiovascular: Negative.   Endocrine: Negative.   Musculoskeletal: Negative.   Neurological: Negative.   Hematological: Negative.    Psychiatric/Behavioral: Negative.    All other systems reviewed and are negative.    Objective: Vital Signs: There were no vitals taken for this visit.  Physical Exam Vitals and nursing note reviewed.  Constitutional:      Appearance: She is well-developed.  HENT:     Head: Normocephalic and atraumatic.  Pulmonary:     Effort: Pulmonary effort is normal.  Abdominal:     Palpations: Abdomen is soft.  Musculoskeletal:     Cervical back: Neck supple.  Skin:    General: Skin is warm.     Capillary Refill: Capillary refill takes less than 2 seconds.  Neurological:     Mental Status: She is alert and oriented to person, place, and time.  Psychiatric:        Behavior: Behavior normal.        Thought Content: Thought content normal.        Judgment: Judgment normal.     Ortho Exam Exam of the left shoulder is unchanged from prior visit.  She has pain mainly to the posterior trapezius region.  She has an equivocal Spurling sign.  No focal motor or sensory deficits.  Range of motion is guarded due to pain.  Specialty Comments:  No specialty comments available.  Imaging: No results found.  PMFS History: Patient Active Problem List   Diagnosis Date Noted   Pain in left shoulder 08/02/2023   Backache 11/19/2010   INSOMNIA-SLEEP DISORDER-UNSPEC 08/27/2010   Anxiety state 03/24/2009   EATING DISORDER, UNSPECIFIED 03/24/2009   DEPRESSION 03/24/2009   Migraine headache 03/24/2009   GERD 03/24/2009   Gastric ulcer 03/24/2009   FATIGUE 03/24/2009   Past Medical History:  Diagnosis Date   Asthma    Bipolar 2 disorder (HCC)    BRCA negative 12/2019   MyRisk neg; IBIS=13.4%/riskscore=25.3%   Depression    Family history of breast cancer    Family history of ovarian cancer    Fatty liver    Gastric ulcer    Migraines    Pituitary cyst (HCC)    Seizures (HCC)     Family History  Problem Relation Age of Onset   Cervical cancer Mother 60   Ovarian cancer Mother 88    Breast cancer Maternal Grandmother 36   Breast cancer Maternal Aunt 35    Past Surgical History:  Procedure Laterality Date   ABDOMINAL HYSTERECTOMY     CHOLECYSTECTOMY     Social History   Occupational History   Not on file  Tobacco Use   Smoking status: Former    Current packs/day: 0.50    Types: Cigarettes   Smokeless tobacco: Never  Vaping Use   Vaping status: Never Used  Substance and Sexual Activity   Alcohol use: Not Currently   Drug use: No   Sexual activity: Yes    Birth control/protection: Surgical

## 2023-09-14 ENCOUNTER — Encounter: Payer: Self-pay | Admitting: Rehabilitative and Restorative Service Providers"

## 2023-09-19 ENCOUNTER — Encounter: Payer: Self-pay | Admitting: Physical Therapy

## 2023-09-26 ENCOUNTER — Encounter: Payer: Self-pay | Admitting: Rehabilitative and Restorative Service Providers"

## 2023-10-26 ENCOUNTER — Encounter: Payer: Self-pay | Admitting: Orthopaedic Surgery

## 2023-10-31 NOTE — Progress Notes (Signed)
 Office Visit Note   Patient: Andrea Hood           Date of Birth: [birth year]           MRN: [id number] Visit Date: 11/01/2023              Requested by: Andrea Anes, MD [address] [facility] [place],  [place] [address] PCP: Andrea Anes, MD   Assessment & Plan: Visit Diagnoses:  1. Superior labrum anterior-to-posterior (SLAP) tear of left shoulder     Plan: MRI of the cervical spine is fairly unremarkable.  MRI of the left shoulder shows an extensive SLAP tear she has lots of trouble with ADLs such as putting on her bra and driving.  Has trouble sleeping at night.  Based on these findings and the treatment options Andrea Hood has elected to move forward with arthroscopic surgery with biceps tenodesis.  Risk benefits prognosis reviewed.  Work note provided.  She will begin physical therapy 1 week postop.  Nurse case manager was present today.  Total face to face encounter time was greater than 25 minutes and over half of this time was spent in counseling and/or coordination of care.  Follow-Up Instructions: No follow-ups on file.   Orders:  No orders of the defined types were placed in this encounter.  No orders of the defined types were placed in this encounter.     Procedures: No procedures performed   Clinical Data: No additional findings.   Subjective: No chief complaint on file.   HPI Andrea Hood returns today to discuss C-spine and left shoulder MRI scan. Review of Systems  Constitutional: Negative.   HENT: Negative.    Eyes: Negative.   Respiratory: Negative.    Cardiovascular: Negative.   Endocrine: Negative.   Musculoskeletal: Negative.   Neurological: Negative.   Hematological: Negative.   Psychiatric/Behavioral: Negative.    All other systems reviewed and are negative.    Objective: Vital Signs: There were no vitals taken for this visit.  Physical Exam Vitals and nursing note reviewed.  Constitutional:      Appearance:  She is well-developed.  HENT:     Head: Normocephalic and atraumatic.  Pulmonary:     Effort: Pulmonary effort is normal.  Abdominal:     Palpations: Abdomen is soft.  Musculoskeletal:     Cervical back: Neck supple.  Skin:    General: Skin is warm.     Capillary Refill: Capillary refill takes less than 2 seconds.  Neurological:     Mental Status: She is alert and oriented to person, place, and time.  Psychiatric:        Behavior: Behavior normal.        Thought Content: Thought content normal.        Judgment: Judgment normal.    Ortho Exam Examinations of the cervical spine and left shoulder are unchanged. Specialty Comments:  No specialty comments available.  Imaging: No results found.   PMFS History: Patient Active Problem List   Diagnosis Date Noted   Pain in left shoulder 08/02/2023   Backache 11/19/2010   INSOMNIA-SLEEP DISORDER-UNSPEC 08/27/2010   Anxiety state 03/24/2009   EATING DISORDER, UNSPECIFIED 03/24/2009   DEPRESSION 03/24/2009   Migraine headache 03/24/2009   GERD 03/24/2009   Gastric ulcer 03/24/2009   FATIGUE 03/24/2009   Past Medical History:  Diagnosis Date   Asthma    Bipolar 2 disorder (HCC)    BRCA negative 12/2019   MyRisk neg;  IBIS=13.4%/riskscore=25.3%   Depression    Family history of breast cancer    Family history of ovarian cancer    Fatty liver    Gastric ulcer    Migraines    Pituitary cyst (HCC)    Seizures (HCC)     Family History  Problem Relation Age of Onset   Cervical cancer Mother 42   Ovarian cancer Mother 43   Breast cancer Maternal Grandmother 46   Breast cancer Maternal Aunt 35    Past Surgical History:  Procedure Laterality Date   ABDOMINAL HYSTERECTOMY     CHOLECYSTECTOMY     Social History   Occupational History   Not on file  Tobacco Use   Smoking status: Former    Current packs/day: 0.50    Types: Cigarettes   Smokeless tobacco: Never  Vaping Use   Vaping status: Never Used  Substance  and Sexual Activity   Alcohol use: Not Currently   Drug use: No   Sexual activity: Yes    Birth control/protection: Surgical

## 2023-11-01 ENCOUNTER — Ambulatory Visit (INDEPENDENT_AMBULATORY_CARE_PROVIDER_SITE_OTHER): Payer: Self-pay | Admitting: Orthopaedic Surgery

## 2023-11-01 DIAGNOSIS — S43432A Superior glenoid labrum lesion of left shoulder, initial encounter: Secondary | ICD-10-CM | POA: Diagnosis not present

## 2023-11-01 NOTE — Addendum Note (Signed)
 Addended by: Wendi Maya on: 11/01/2023 02:09 PM   Modules accepted: Orders

## 2023-11-15 ENCOUNTER — Other Ambulatory Visit: Payer: Self-pay | Admitting: Physician Assistant

## 2023-11-15 MED ORDER — HYDROCODONE-ACETAMINOPHEN 5-325 MG PO TABS: 1.0000 | ORAL_TABLET | Freq: Three times a day (TID) | ORAL | 0 refills | Status: DC | PRN

## 2023-11-15 MED ORDER — ONDANSETRON HCL 4 MG PO TABS: 4.0000 mg | ORAL_TABLET | Freq: Three times a day (TID) | ORAL | 0 refills | Status: AC | PRN

## 2023-11-17 ENCOUNTER — Encounter: Payer: Self-pay | Admitting: Orthopaedic Surgery

## 2023-11-17 DIAGNOSIS — M7522 Bicipital tendinitis, left shoulder: Secondary | ICD-10-CM | POA: Diagnosis not present

## 2023-11-17 DIAGNOSIS — S43432A Superior glenoid labrum lesion of left shoulder, initial encounter: Secondary | ICD-10-CM | POA: Diagnosis not present

## 2023-11-24 ENCOUNTER — Ambulatory Visit (INDEPENDENT_AMBULATORY_CARE_PROVIDER_SITE_OTHER): Payer: Worker's Compensation | Admitting: Orthopaedic Surgery

## 2023-11-24 DIAGNOSIS — S43432A Superior glenoid labrum lesion of left shoulder, initial encounter: Secondary | ICD-10-CM

## 2023-11-24 MED ORDER — HYDROCODONE-ACETAMINOPHEN 5-325 MG PO TABS: 1.0000 | ORAL_TABLET | Freq: Every day | ORAL | 0 refills | Status: DC | PRN

## 2023-11-24 NOTE — Progress Notes (Signed)
   Post-Op Visit Note   Patient: Andrea Hood           Date of Birth: Dec 02, 1975           MRN: [id number] Visit Date: 11/24/2023 PCP: Laurice Anes, MD   Assessment & Plan:  Chief Complaint:  Chief Complaint  Patient presents with   Left Shoulder - Follow-up    Left shoulder scope 11/17/2023   Visit Diagnoses:  1. Superior labrum anterior-to-posterior (SLAP) tear of left shoulder     Plan: Andrea Hood is 1 week status post left shoulder scope and biceps tenodesis.  Overall pain is well-controlled.  Case manager present today.  Examination of the left shoulder shows healed surgical incisions.  No signs of infection or drainage.  Range of motion is progressing nicely.  Sutures removed Steri-Strips applied.  Physical therapy is scheduled to start tomorrow.  Just range of motion for now.  No strengthening for 3 weeks.  Recheck at that time.  Work note provided.  Follow-Up Instructions: Return in about 3 weeks (around 12/15/2023).   Orders:  No orders of the defined types were placed in this encounter.  Meds ordered this encounter  Medications   HYDROcodone -acetaminophen  (NORCO) 5-325 MG tablet    Sig: Take 1 tablet by mouth daily as needed.    Dispense:  15 tablet    Refill:  0    Imaging: No results found.  PMFS History: Patient Active Problem List   Diagnosis Date Noted   Pain in left shoulder 08/02/2023   Backache 11/19/2010   INSOMNIA-SLEEP DISORDER-UNSPEC 08/27/2010   Anxiety state 03/24/2009   EATING DISORDER, UNSPECIFIED 03/24/2009   DEPRESSION 03/24/2009   Migraine headache 03/24/2009   GERD 03/24/2009   Gastric ulcer 03/24/2009   FATIGUE 03/24/2009   Past Medical History:  Diagnosis Date   Asthma    Bipolar 2 disorder (HCC)    BRCA negative 12/2019   MyRisk neg; IBIS=13.4%/riskscore=25.3%   Depression    Family history of breast cancer    Family history of ovarian cancer    Fatty liver    Gastric ulcer    Migraines    Pituitary cyst (HCC)     Seizures (HCC)     Family History  Problem Relation Age of Onset   Cervical cancer Mother 105   Ovarian cancer Mother 3   Breast cancer Maternal Grandmother 86   Breast cancer Maternal Aunt 35    Past Surgical History:  Procedure Laterality Date   ABDOMINAL HYSTERECTOMY     CHOLECYSTECTOMY     Social History   Occupational History   Not on file  Tobacco Use   Smoking status: Former    Current packs/day: 0.50    Types: Cigarettes   Smokeless tobacco: Never  Vaping Use   Vaping status: Never Used  Substance and Sexual Activity   Alcohol use: Not Currently   Drug use: No   Sexual activity: Yes    Birth control/protection: Surgical

## 2023-11-25 ENCOUNTER — Ambulatory Visit (INDEPENDENT_AMBULATORY_CARE_PROVIDER_SITE_OTHER): Payer: Worker's Compensation | Admitting: Rehabilitative and Restorative Service Providers"

## 2023-11-25 ENCOUNTER — Encounter: Payer: Self-pay | Admitting: Rehabilitative and Restorative Service Providers"

## 2023-11-25 DIAGNOSIS — R293 Abnormal posture: Secondary | ICD-10-CM | POA: Diagnosis not present

## 2023-11-25 DIAGNOSIS — M6281 Muscle weakness (generalized): Secondary | ICD-10-CM | POA: Diagnosis not present

## 2023-11-25 DIAGNOSIS — R600 Localized edema: Secondary | ICD-10-CM

## 2023-11-25 DIAGNOSIS — M25512 Pain in left shoulder: Secondary | ICD-10-CM

## 2023-11-25 DIAGNOSIS — G8929 Other chronic pain: Secondary | ICD-10-CM

## 2023-11-25 DIAGNOSIS — M25612 Stiffness of left shoulder, not elsewhere classified: Secondary | ICD-10-CM

## 2023-11-25 NOTE — Therapy (Signed)
 OUTPATIENT PHYSICAL THERAPY SHOULDER EVALUATION   Patient Name: Andrea Hood MRN: [id number] DOB:Mar 03, 1976, 48 y.o., female Today's Date: 11/25/2023  END OF SESSION:  PT End of Session - 11/25/23 1319     Visit Number 1    Number of Visits 24    Date for PT Re-Evaluation 02/10/24    Authorization Type Work Comp    Authorization - Visit Number 12    Progress Note Due on Visit 12    PT Start Time 1015    PT Stop Time 1100    PT Time Calculation (min) 45 min    Activity Tolerance Patient tolerated treatment well;No increased pain;Patient limited by pain    Behavior During Therapy [facility] for tasks assessed/performed             Past Medical History:  Diagnosis Date   Asthma    Bipolar 2 disorder (HCC)    BRCA negative 12/2019   MyRisk neg; IBIS=13.4%/riskscore=25.3%   Depression    Family history of breast cancer    Family history of ovarian cancer    Fatty liver    Gastric ulcer    Migraines    Pituitary cyst (HCC)    Seizures (HCC)    Past Surgical History:  Procedure Laterality Date   ABDOMINAL HYSTERECTOMY     CHOLECYSTECTOMY     Patient Active Problem List   Diagnosis Date Noted   Pain in left shoulder 08/02/2023   Backache 11/19/2010   INSOMNIA-SLEEP DISORDER-UNSPEC 08/27/2010   Anxiety state 03/24/2009   EATING DISORDER, UNSPECIFIED 03/24/2009   DEPRESSION 03/24/2009   Migraine headache 03/24/2009   GERD 03/24/2009   Gastric ulcer 03/24/2009   FATIGUE 03/24/2009    PCP: Oneil Galloway, MD  REFERRING PROVIDER: Kay CHRISTELLA Cummins, MD  REFERRING DIAG: [phone] (ICD-10-CM) - Superior labrum anterior-to-posterior (SLAP) tear of left shoulder  THERAPY DIAG:  Abnormal posture  Muscle weakness (generalized)  Chronic left shoulder pain  Stiffness of left shoulder, not elsewhere classified  Localized edema  Rationale for Evaluation and Treatment: Rehabilitation  ONSET DATE: 11/17/2023 surgery was biceps tenodesis and arthroscopic  debridement  SUBJECTIVE:                                                                                                                                                                                      SUBJECTIVE STATEMENT: Andrea Hood was transferring a patient from a gurney to an MRI when she felt a burning sensation of the left arm.  Previous PT was not enough and an MRI showed a SLAP tear.  Dr. Cummins scoped the tear and did a biceps tenodesis 11/17/2023.    Hand dominance: Right  PERTINENT HISTORY: Asthma, bipolar, back pain, insomnia, depression, seizures, migraines, history of Rt shoulder surgery (clavicle excision)  PAIN:  Are you having pain? Yes: NPRS scale: 3-6/10 this week Pain location: Left lateral arm Pain description: Achy with sharp at night Aggravating factors: Dog sticking head in sling, sleep  Relieving factors: Hydrocodone   PRECAUTIONS: Shoulder arthroscopy of SLAP tear with biceps tenodesis  RED FLAGS: None   WEIGHT BEARING RESTRICTIONS: No  FALLS:  Has patient fallen in last 6 months? 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  NEXT MD VISIT: 12/15/2023 @ 10:15  OBJECTIVE:  Note: Objective measures were completed at Evaluation unless otherwise noted.  DIAGNOSTIC FINDINGS:  No fracture or dislocation of the left shoulder. Mild glenohumeral arthrosis.  [facility] at [facility] showed the SLAP tear  PATIENT SURVEYS:  FOTO 44 (risk-adjusted 34, Goal 58 in 19 visits)  COGNITION: Overall cognitive status: Within functional limits for tasks assessed     SENSATION: WFL  POSTURE: Mildly flexed  UPPER EXTREMITY ROM:    PROM Left/Right 11/25/2023   Shoulder flexion 85/170   Shoulder  extension    Shoulder abduction    Shoulder horizontal adduction 0/40   Shoulder internal rotation 30/80   Shoulder external rotation 15/95   Elbow flexion    Elbow extension    Wrist flexion    Wrist extension    Wrist ulnar deviation    Wrist radial deviation    Wrist pronation    Wrist supination    (Blank rows = not tested)  UPPER EXTREMITY STRENGTH: Deferred at evaluation secondary to being less than 2 weeks post-surgery  MMT    Shoulder flexion    Shoulder extension    Shoulder abduction    Shoulder adduction    Shoulder internal rotation    Shoulder external rotation    Middle trapezius    Lower trapezius    Elbow flexion    Elbow extension    Wrist flexion    Wrist extension    Wrist ulnar deviation    Wrist radial deviation    Wrist pronation    Wrist supination    Grip strength (lbs)    (Blank rows = not tested)                                                                                              TREATMENT DATE: 11/25/2023 Shoulder blade pinches 10 x 5 seconds Codmans forward/back; side to side; CW; CCW 20 x each  Functional Activities: Reaching: Supine active-assisted arm raises 10 x 3 seconds Reviewed exam findings, shoulder anatomy with shoulder model and discussed frequency and length of expected rehabilitation  PATIENT EDUCATION: Education details: See above Person educated: Patient Education method: Explanation, Demonstration, Tactile cues, Verbal cues, and Handouts Education comprehension: verbalized understanding, returned demonstration, verbal cues required, tactile cues required, and needs further education  HOME EXERCISE  PROGRAM: Access Code: [email or web address] URL: [email or web address] Date: 11/25/2023 Prepared by: Lamar Ivory  Exercises - Supine Active Assistive Single Arm Shoulder Protraction  - 3 x daily - 7 x weekly - 1 sets - 20 reps - 3 seconds hold - Standing Scapular Retraction  - 5 x daily - 7 x weekly - 1 sets - 5  reps - 5 second hold - Pendulums  - 3-5 x daily - 7 x weekly - 1 sets - 30 reps  ASSESSMENT:  CLINICAL IMPRESSION: Patient is a 48 y.o. female who was seen today for physical therapy evaluation and treatment for S43.432A (ICD-10-CM) - Superior labrum anterior-to-posterior (SLAP) tear of left shoulder.  Andrea Hood had a left shoulder arthroscopy with biceps tenodesis 11/17/2023.  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  Her prognosis to meet the below listed goals is good with the recommended plan of care.  OBJECTIVE IMPAIRMENTS: decreased activity tolerance, decreased coordination, decreased endurance, decreased knowledge of condition, decreased ROM, decreased strength, decreased safety awareness, increased edema, impaired perceived functional ability, impaired UE functional use, and pain.   ACTIVITY LIMITATIONS: carrying, lifting, sleeping, dressing, and reach over head  PARTICIPATION LIMITATIONS: community activity and occupation  PERSONAL FACTORS: Asthma, bipolar, back pain, insomnia, depression, seizures, migraines, history of Rt shoulder surgery (clavicle excision) are also affecting patient's functional outcome.   REHAB POTENTIAL: Good  CLINICAL DECISION MAKING: Stable/uncomplicated  EVALUATION COMPLEXITY: Low   GOALS: Goals reviewed with patient? Yes  SHORT TERM GOALS: Target date: 01/06/2024  Improve left shoulder active range of motion for flexion to 135 degrees; IR to 45 degrees; ER to 45 degrees and horizontal adduction to 20 degrees Baseline: 85; 30; 15; 0 respectively Goal status: INITIAL  2.  Andrea Hood will be independent with her day 1 home exercise program Baseline: Started 11/25/2023 Goal status: INITIAL  3.  Andrea Hood will be out of her sling 100% of the time without increased left shoulder pain Baseline: In the sling 90+ percent of the time at evaluation Goal status: INITIAL   LONG TERM GOALS: Target date: 02/10/2024  Improve FOTO to 58  in 19 visits. Baseline: 44, risk adjusted 34 Goal status: INITIAL  2.  Andrea Hood will report left shoulder pain consistently 0-2/10 on the visual analog scale Baseline: 3-6/10 Goal status: INITIAL  3.  Improve left shoulder active range of motion to 90% or greater of: Flexion 170 degrees; external rotation 90 degrees; internal rotation 60 degrees and horizontal adduction 40 degrees Baseline: See short-term goals Goal status: INITIAL  4.  Improve left shoulder strength to 70% or greater of the uninvolved right at 12 weeks post-surgery Baseline: Deferred secondary to being less than 2 weeks post-surgery Goal status: INITIAL  5.  Andrea Hood will be independent with her long-term home maintenance exercise program at discharge Baseline: Started 11/25/2023 Goal status: INITIAL  6.  Ambulate when able to reach overhead and behind her back without increased pain or functional limitations at discharge Baseline: Unable at evaluation Goal status: INITIAL  PLAN:  PT FREQUENCY:  2-3 x a week  PT DURATION: 12 weeks  PLANNED INTERVENTIONS: 97110-Therapeutic exercises, 97530- Therapeutic activity, 97112- Neuromuscular re-education, 97535- Self Care, 02859- Manual therapy, 97016- Vasopneumatic device, Patient/Family education, Joint mobilization, Cryotherapy, and Moist heat  PLAN FOR NEXT SESSION: Review HEP.  Arthroscopy of SLAP tear, biceps tenodesis.   Myer LELON Ivory, PT 11/25/2023, 1:40 PM

## 2023-11-30 ENCOUNTER — Ambulatory Visit (INDEPENDENT_AMBULATORY_CARE_PROVIDER_SITE_OTHER): Payer: Worker's Compensation | Admitting: Rehabilitative and Restorative Service Providers"

## 2023-11-30 ENCOUNTER — Telehealth: Payer: Self-pay | Admitting: Orthopaedic Surgery

## 2023-11-30 ENCOUNTER — Encounter: Payer: Self-pay | Admitting: Rehabilitative and Restorative Service Providers"

## 2023-11-30 DIAGNOSIS — R293 Abnormal posture: Secondary | ICD-10-CM | POA: Diagnosis not present

## 2023-11-30 DIAGNOSIS — M25512 Pain in left shoulder: Secondary | ICD-10-CM

## 2023-11-30 DIAGNOSIS — M25612 Stiffness of left shoulder, not elsewhere classified: Secondary | ICD-10-CM | POA: Diagnosis not present

## 2023-11-30 DIAGNOSIS — R600 Localized edema: Secondary | ICD-10-CM

## 2023-11-30 DIAGNOSIS — M6281 Muscle weakness (generalized): Secondary | ICD-10-CM

## 2023-11-30 DIAGNOSIS — G8929 Other chronic pain: Secondary | ICD-10-CM

## 2023-11-30 NOTE — Therapy (Signed)
OUTPATIENT PHYSICAL THERAPY TREATMENT   Patient Name: Andrea Hood MRN: [id number] DOB:12/24/1975, 48 y.o., female Today's Date: 11/30/2023  END OF SESSION:  PT End of Session - 11/30/23 1335     Visit Number 2    Number of Visits 24    Date for PT Re-Evaluation 02/10/24    Authorization Type Work Comp    Authorization Time Period Per Misty Stanley from [employer] on eval    Authorization - Visit Number 2    Authorization - Number of Visits 12    Progress Note Due on Visit 12    PT Start Time 1345    PT Stop Time 1426    PT Time Calculation (min) 41 min    Activity Tolerance Patient tolerated treatment well;Patient limited by pain    Behavior During Therapy WFL for tasks assessed/performed              Past Medical History:  Diagnosis Date   Asthma    Bipolar 2 disorder (HCC)    BRCA negative 12/2019   MyRisk neg; IBIS=13.4%/riskscore=25.3%   Depression    Family history of breast cancer    Family history of ovarian cancer    Fatty liver    Gastric ulcer    Migraines    Pituitary cyst (HCC)    Seizures (HCC)    Past Surgical History:  Procedure Laterality Date   ABDOMINAL HYSTERECTOMY     CHOLECYSTECTOMY     Patient Active Problem List   Diagnosis Date Noted   Pain in left shoulder 08/02/2023   Backache 11/19/2010   INSOMNIA-SLEEP DISORDER-UNSPEC 08/27/2010   Anxiety state 03/24/2009   EATING DISORDER, UNSPECIFIED 03/24/2009   DEPRESSION 03/24/2009   Migraine headache 03/24/2009   GERD 03/24/2009   Gastric ulcer 03/24/2009   FATIGUE 03/24/2009    PCP: Jackson Latino, MD  REFERRING PROVIDER: Tarry Kos, MD  REFERRING DIAG: [phone] (ICD-10-CM) - Superior labrum anterior-to-posterior (SLAP) tear of left shoulder  THERAPY DIAG:  Abnormal posture  Muscle weakness (generalized)  Chronic left shoulder pain  Stiffness of left shoulder, not elsewhere classified  Localized edema  Rationale for Evaluation and Treatment: Rehabilitation  ONSET DATE:  11/17/2023 surgery was biceps tenodesis and arthroscopic debridement  SUBJECTIVE:                                                                                                                                                                                      SUBJECTIVE STATEMENT: Pt indicated not good today.  Pt indicated having pain under arm into armpit.  Pt indicated pain medicine hasn't seemed to help that.  Pt indicated tenderness to touch. Pt indicated insidious  worsened symptoms about 3 days.    PERTINENT HISTORY: Asthma, bipolar, back pain, insomnia, depression, seizures, migraines, history of Rt shoulder surgery (clavicle excision)  PAIN:  NPRS scale: upon arrival 7/10.  Pain location: Lt shoulder, Lt underarm Pain description: Achy with sharp at night Aggravating factors: constant Relieving factors: Hydrocodone some  PRECAUTIONS: Shoulder arthroscopy of SLAP tear with biceps tenodesis No strengthening for 3 weeks per MD note on 11/24/2023  RED FLAGS: None   WEIGHT BEARING RESTRICTIONS: No  FALLS:  Has patient fallen in last 6 months? 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  NEXT MD VISIT: 12/15/2023 @ 10:15  OBJECTIVE:  Note: Objective measures were completed at Evaluation unless otherwise noted.  DIAGNOSTIC FINDINGS:  No fracture or dislocation of the left shoulder. Mild glenohumeral arthrosis.  MRI at [facility] showed the SLAP tear  PATIENT SURVEYS:  11/25/2023 FOTO 44 (risk-adjusted 34, Goal 58 in 19 visits)  COGNITION: 11/25/2023 Overall cognitive status: Within functional limits for tasks assessed     SENSATION: 11/25/2023 [facility]  POSTURE: 11/25/2023 Mildly  flexed  UPPER EXTREMITY ROM:     Left/Right 11/25/2023 PROM Left 11/30/2023 Supine PROM  Shoulder flexion 85/170 120  Shoulder extension    Shoulder abduction  120  Shoulder horizontal adduction 0/40   Shoulder internal rotation 30/80   Shoulder external rotation 15/95 40 in 30 deg abduction  Elbow flexion    Elbow extension    Wrist flexion    Wrist extension    Wrist ulnar deviation    Wrist radial deviation    Wrist pronation    Wrist supination    (Blank rows = not tested)  UPPER EXTREMITY STRENGTH: Deferred at evaluation secondary to being less than 2 weeks post-surgery  MMT    Shoulder flexion    Shoulder extension    Shoulder abduction    Shoulder adduction    Shoulder internal rotation    Shoulder external rotation    Middle trapezius    Lower trapezius    Elbow flexion    Elbow extension    Wrist flexion    Wrist extension    Wrist ulnar deviation    Wrist radial deviation    Wrist pronation    Wrist supination    Grip strength (lbs)    (Blank rows = not tested)                                                                                                             TREATMENT:         DATE: 11/30/2023 Manual: Supine Rt shoulder inferior glides g2, posterior glide c ER passive range.  Distraction c flexion,scaption and abduction movement.   Therex Supine scapular retraction hold 5 sec x 10 Supine Lt shoulder AAROM  flexion with Rt arm to tolerance 2-3 sec hold x 10   Verbal review of HEP performed with cues for techniques (performed in part during part of vaso/estim time)  Neuro Re-ed Education given regarding neuro sensitization and presentation related to chronic pain symptoms. Used visual diagram to explain.  Pt understood discussion. Performed during majority of vaso/estim time.   Estim/Vaso IFC to Lt shoulder/upper arm to tolerance with use of vaso at 34 deg low compression 10 mins    TREATMENT:         DATE: 11/25/2023 Shoulder blade pinches  10 x 5 seconds Codmans forward/back; side to side; CW; CCW 20 x each  Functional Activities: Reaching: Supine active-assisted arm raises 10 x 3 seconds Reviewed exam findings, shoulder anatomy with shoulder model and discussed frequency and length of expected rehabilitation  PATIENT EDUCATION: Education details: See above Person educated: Patient Education method: Explanation, Demonstration, Tactile cues, Verbal cues, and Handouts Education comprehension: verbalized understanding, returned demonstration, verbal cues required, tactile cues required, and needs further education  HOME EXERCISE PROGRAM: Access Code: [email or web address] URL: [email or web address] Date: 11/25/2023 Prepared by: Pauletta Browns  Exercises - Supine Active Assistive Single Arm Shoulder Protraction  - 3 x daily - 7 x weekly - 1 sets - 20 reps - 3 seconds hold - Standing Scapular Retraction  - 5 x daily - 7 x weekly - 1 sets - 5 reps - 5 second hold - Pendulums  - 3-5 x daily - 7 x weekly - 1 sets - 30 reps  ASSESSMENT:  CLINICAL IMPRESSION: Guarding and pain most limiting factor in Lt arm mobility at this time passively. Improvement was noted during course of manual with measurements better than initial evaluation.  Additional time spent today in education aspects of pain neuroscience and HEP review.  Due to patient symptoms and patient preference, use of vaso and estim performed with positive result.  May use in future pending symptom presentation.  Continued skilled PT services warranted at this time.     OBJECTIVE IMPAIRMENTS: decreased activity tolerance, decreased coordination, decreased endurance, decreased knowledge of condition, decreased ROM, decreased strength, decreased safety awareness, increased edema, impaired perceived functional ability, impaired UE functional use, and pain.   ACTIVITY LIMITATIONS: carrying, lifting, sleeping, dressing, and reach over head  PARTICIPATION LIMITATIONS:  community activity and occupation  PERSONAL FACTORS: Asthma, bipolar, back pain, insomnia, depression, seizures, migraines, history of Rt shoulder surgery (clavicle excision) are also affecting patient's functional outcome.   REHAB POTENTIAL: Good  CLINICAL DECISION MAKING: Stable/uncomplicated  EVALUATION COMPLEXITY: Low   GOALS: Goals reviewed with patient? Yes  SHORT TERM GOALS: Target date: 01/06/2024  Improve left shoulder active range of motion for flexion to 135 degrees; IR to 45 degrees; ER to 45 degrees and horizontal adduction to 20 degrees Baseline: 85; 30; 15; 0 respectively Goal status: on going 11/30/2023  2.  Azjah will be independent with her day 1 home exercise program Baseline: Started 11/25/2023 Goal status: on going 11/30/2023  3.  [place]kee will be out of her sling 100% of the time without increased left shoulder pain Baseline: In the sling 90+ percent of the time at evaluation Goal status: on going 11/30/2023   LONG TERM GOALS: Target date: 02/10/2024  Improve FOTO to 58 in 19 visits. Baseline: 44, risk adjusted 34 Goal status: INITIAL  2.  Kerin will report left shoulder pain consistently 0-2/10 on the visual analog scale Baseline: 3-6/10 Goal status: INITIAL  3.  Improve left shoulder active range of motion  to 90% or greater of: Flexion 170 degrees; external rotation 90 degrees; internal rotation 60 degrees and horizontal adduction 40 degrees Baseline: See short-term goals Goal status: INITIAL  4.  Improve left shoulder strength to 70% or greater of the uninvolved right at 12 weeks post-surgery Baseline: Deferred secondary to being less than 2 weeks post-surgery Goal status: INITIAL  5.  [place] will be independent with her long-term home maintenance exercise program at discharge Baseline: Started 11/25/2023 Goal status: INITIAL  6.  Ambulate when able to reach overhead and behind her back without increased pain or functional limitations at  discharge Baseline: Unable at evaluation Goal status: INITIAL  PLAN:  PT FREQUENCY:  2-3 x a week  PT DURATION: 12 weeks  PLANNED INTERVENTIONS: 97110-Therapeutic exercises, 97530- Therapeutic activity, 97112- Neuromuscular re-education, 97535- Self Care, 09811- Manual therapy, 97016- Vasopneumatic device, Patient/Family education, Joint mobilization, Cryotherapy, and Moist heat  PLAN FOR NEXT SESSION: Passive range/AAROM focus.  Vaso/estim post treatment as indicated.   No strengthening for 3 weeks per MD note on 11/24/2023   Chyrel Masson, PT, DPT, OCS, ATC 11/30/23  2:32 PM

## 2023-11-30 NOTE — Telephone Encounter (Signed)
Pt states the Hydrocodone is not working for the pain and she request something else.     [facility] in [place] on [address] & [address]

## 2023-12-01 ENCOUNTER — Other Ambulatory Visit: Payer: Self-pay | Admitting: Physician Assistant

## 2023-12-01 ENCOUNTER — Telehealth: Payer: Self-pay | Admitting: Physician Assistant

## 2023-12-01 ENCOUNTER — Ambulatory Visit (INDEPENDENT_AMBULATORY_CARE_PROVIDER_SITE_OTHER): Payer: Self-pay | Admitting: Rehabilitative and Restorative Service Providers"

## 2023-12-01 ENCOUNTER — Encounter: Payer: Self-pay | Admitting: Rehabilitative and Restorative Service Providers"

## 2023-12-01 ENCOUNTER — Other Ambulatory Visit: Payer: Self-pay | Admitting: Orthopaedic Surgery

## 2023-12-01 DIAGNOSIS — M25512 Pain in left shoulder: Secondary | ICD-10-CM

## 2023-12-01 DIAGNOSIS — M25612 Stiffness of left shoulder, not elsewhere classified: Secondary | ICD-10-CM

## 2023-12-01 DIAGNOSIS — R293 Abnormal posture: Secondary | ICD-10-CM

## 2023-12-01 DIAGNOSIS — G8929 Other chronic pain: Secondary | ICD-10-CM

## 2023-12-01 DIAGNOSIS — R600 Localized edema: Secondary | ICD-10-CM

## 2023-12-01 DIAGNOSIS — M6281 Muscle weakness (generalized): Secondary | ICD-10-CM

## 2023-12-01 MED ORDER — METHOCARBAMOL 750 MG PO TABS: 750.0000 mg | ORAL_TABLET | Freq: Two times a day (BID) | ORAL | 0 refills | Status: DC | PRN

## 2023-12-01 MED ORDER — HYDROCODONE-ACETAMINOPHEN 5-325 MG PO TABS: 1.0000 | ORAL_TABLET | Freq: Every day | ORAL | 0 refills | Status: AC | PRN

## 2023-12-01 NOTE — Telephone Encounter (Signed)
Pt called stating she went to pick up medications and pharmacy will not fill hydrocodone due to script need to be changed to hydrocodone to take every 4 to 6 hrs. Please call [facility] and change and call pt when it has been corrected. Pt phone number is [phone].

## 2023-12-01 NOTE — Telephone Encounter (Signed)
Notified patient.

## 2023-12-01 NOTE — Therapy (Signed)
OUTPATIENT PHYSICAL THERAPY TREATMENT   Patient Name: Andrea Hood MRN: [id number] DOB:November 13, 1975, 48 y.o., female Today's Date: 12/01/2023  END OF SESSION:  PT End of Session - 12/01/23 1516     Visit Number 3    Number of Visits 24    Date for PT Re-Evaluation 02/10/24    Authorization Type Work Comp    Authorization Time Period Per Misty Stanley from [place] on eval    Authorization - Number of Visits 12    Progress Note Due on Visit 12    PT Start Time 1514    PT Stop Time 1604    PT Time Calculation (min) 50 min    Activity Tolerance Patient tolerated treatment well;Patient limited by pain;No increased pain    Behavior During Therapy WFL for tasks assessed/performed             Past Medical History:  Diagnosis Date   Asthma    Bipolar 2 disorder (HCC)    BRCA negative 12/2019   MyRisk neg; IBIS=13.4%/riskscore=25.3%   Depression    Family history of breast cancer    Family history of ovarian cancer    Fatty liver    Gastric ulcer    Migraines    Pituitary cyst (HCC)    Seizures (HCC)    Past Surgical History:  Procedure Laterality Date   ABDOMINAL HYSTERECTOMY     CHOLECYSTECTOMY     Patient Active Problem List   Diagnosis Date Noted   Pain in left shoulder 08/02/2023   Backache 11/19/2010   INSOMNIA-SLEEP DISORDER-UNSPEC 08/27/2010   Anxiety state 03/24/2009   EATING DISORDER, UNSPECIFIED 03/24/2009   DEPRESSION 03/24/2009   Migraine headache 03/24/2009   GERD 03/24/2009   Gastric ulcer 03/24/2009   FATIGUE 03/24/2009    PCP: Jackson Latino, MD  REFERRING PROVIDER: Tarry Kos, MD  REFERRING DIAG: [phone] (ICD-10-CM) - Superior labrum anterior-to-posterior (SLAP) tear of left shoulder  THERAPY DIAG:  Abnormal posture  Muscle weakness (generalized)  Chronic left shoulder pain  Stiffness of left shoulder, not elsewhere classified  Localized edema  Rationale for Evaluation and Treatment: Rehabilitation  ONSET DATE: 11/17/2023 surgery was  biceps tenodesis and arthroscopic debridement  SUBJECTIVE:                                                                                                                                                                                      SUBJECTIVE STATEMENT: [name] notes that sleep is still difficult.  She reports good HEP compliance.    PERTINENT HISTORY: Asthma, bipolar, back pain, insomnia, depression, seizures, migraines, history of Rt shoulder surgery (clavicle excision)  PAIN:  NPRS scale: 5-7/10 this  week Pain location: Lt shoulder and entire arm to the elbow Pain description: Achy with sharp at night Aggravating factors: Pain is constant, lifting or reaching is worst, at night is bad Relieving factors: Hydrocodone doesn't really help, has a new muscle relaxer she is just getting started with  PRECAUTIONS: Shoulder arthroscopy of SLAP tear with biceps tenodesis No strengthening for at least 3 weeks per MD note on 11/24/2023  RED FLAGS: None   WEIGHT BEARING RESTRICTIONS: No  FALLS:  Has patient fallen in last 6 months? 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  NEXT MD VISIT: 12/15/2023 @ 10:15  OBJECTIVE:  Note: Objective measures were completed at Evaluation unless otherwise noted.  DIAGNOSTIC FINDINGS:  No fracture or dislocation of the left shoulder. Mild glenohumeral arthrosis.  MRI at [facility] showed the SLAP tear  PATIENT SURVEYS:  11/25/2023 FOTO 44 (risk-adjusted 34, Goal 58 in 19 visits)  COGNITION: 11/25/2023 Overall cognitive status: Within functional limits for tasks assessed     SENSATION: 11/25/2023 [facility]  POSTURE: 11/25/2023 Mildly flexed  UPPER  EXTREMITY ROM:     Left/Right 11/25/2023 PROM Left 11/30/2023 Supine PROM Left 11/2023  Shoulder flexion 85/170 120   Shoulder extension     Shoulder abduction  120   Shoulder horizontal adduction 0/40    Shoulder internal rotation 30/80    Shoulder external rotation 15/95 40 in 30 deg abduction   Elbow flexion     Elbow extension     Wrist flexion     Wrist extension     Wrist ulnar deviation     Wrist radial deviation     Wrist pronation     Wrist supination     (Blank rows = not tested)  UPPER EXTREMITY STRENGTH: Deferred at evaluation secondary to being less than 2 weeks post-surgery  MMT    Shoulder flexion    Shoulder extension    Shoulder abduction    Shoulder adduction    Shoulder internal rotation    Shoulder external rotation    Middle trapezius    Lower trapezius    Elbow flexion    Elbow extension    Wrist flexion    Wrist extension    Wrist ulnar deviation    Wrist radial deviation    Wrist pronation    Wrist supination    Grip strength (lbs)    (Blank rows = not tested)                                                                                                             TREATMENT:         DATE:  12/01/2023 Shoulder blade pinches 10 x 5 seconds Codmans forward/back; side to side; CW; CCW 20 x each PROM for IR  and ER with particular care with ER (70 degrees scaption for PROM) 20 minutes  Functional Activities: Reaching:  Supine active-assisted arm raises 20 x 3 seconds Overhead function: Supine AAROM shoulder flexion 10 x 2 seconds (protract 1st, palm in)  Vaso left shoulder 10 minutes Low Pressure 34*   11/30/2023 Manual: Supine Rt shoulder inferior glides g2, posterior glide c ER passive range.  Distraction c flexion,scaption and abduction movement.   Therex Supine scapular retraction hold 5 sec x 10 Supine Lt shoulder AAROM flexion with Rt arm to tolerance 2-3 sec hold x 10   Verbal review of HEP performed with cues for techniques  (performed in part during part of vaso/estim time)  Neuro Re-ed Education given regarding neuro sensitization and presentation related to chronic pain symptoms. Used visual diagram to explain.  Pt understood discussion. Performed during majority of vaso/estim time.   Estim/Vaso IFC to Lt shoulder/upper arm to tolerance with use of vaso at 34 deg low compression 10 mins    11/25/2023 Shoulder blade pinches 10 x 5 seconds Codmans forward/back; side to side; CW; CCW 20 x each  Functional Activities: Reaching: Supine active-assisted arm raises 10 x 3 seconds Reviewed exam findings, shoulder anatomy with shoulder model and discussed frequency and length of expected rehabilitation  PATIENT EDUCATION: Education details: See above Person educated: Patient Education method: Explanation, Demonstration, Tactile cues, Verbal cues, and Handouts Education comprehension: verbalized understanding, returned demonstration, verbal cues required, tactile cues required, and needs further education  HOME EXERCISE PROGRAM: Access Code: [email or web address] URL: [email or web address] Date: 12/01/2023 Prepared by: Pauletta Browns  Exercises - Supine Active Assistive Single Arm Shoulder Protraction  - 3 x daily - 7 x weekly - 1 sets - 20 reps - 3 seconds hold - Standing Scapular Retraction  - 5 x daily - 7 x weekly - 1 sets - 5 reps - 5 second hold - Pendulums  - 3-5 x daily - 7 x weekly - 1 sets - 30 reps - Supine Shoulder Flexion AAROM  - 3 x daily - 7 x weekly - 1 sets - 10 reps - 1 hold  ASSESSMENT:  CLINICAL IMPRESSION: Denell is moving a bit better than that evaluation.  I was able to comfortably get her to 70 degrees scaption for IR/ER PROM work and she was able to active-assisted reach overhead 100 degrees with right arm assist.  Continue early emphasis on range while not irritating the Slap tear or biceps tenodesis.  OBJECTIVE IMPAIRMENTS: decreased activity tolerance, decreased coordination,  decreased endurance, decreased knowledge of condition, decreased ROM, decreased strength, decreased safety awareness, increased edema, impaired perceived functional ability, impaired UE functional use, and pain.   ACTIVITY LIMITATIONS: carrying, lifting, sleeping, dressing, and reach over head  PARTICIPATION LIMITATIONS: community activity and occupation  PERSONAL FACTORS: Asthma, bipolar, back pain, insomnia, depression, seizures, migraines, history of Rt shoulder surgery (clavicle excision) are also affecting patient's functional outcome.   REHAB POTENTIAL: Good  CLINICAL DECISION MAKING: Stable/uncomplicated  EVALUATION COMPLEXITY: Low   GOALS: Goals reviewed with patient? Yes  SHORT TERM GOALS: Target date: 01/06/2024  Improve left shoulder active range of motion for flexion to 135 degrees; IR to 45 degrees; ER to 45 degrees and horizontal adduction to 20 degrees Baseline: 85; 30; 15; 0 respectively Goal status: On going 11/30/2023  2.  Myles will be independent with her day 1 home exercise program Baseline: Started 11/25/2023 Goal status: Met 12/01/2023  3.  Lasonya will be out of her sling 100% of the time  without increased left shoulder pain Baseline: In the sling 90+ percent of the time at evaluation Goal status: On going 12/01/2023   LONG TERM GOALS: Target date: 02/10/2024  Improve FOTO to 58 in 19 visits. Baseline: 44, risk adjusted 34 Goal status: INITIAL  2.  Nelani will report left shoulder pain consistently 0-2/10 on the visual analog scale Baseline: 3-6/10 Goal status: INITIAL  3.  Improve left shoulder active range of motion to 90% or greater of: Flexion 170 degrees; external rotation 90 degrees; internal rotation 60 degrees and horizontal adduction 40 degrees Baseline: See short-term goals Goal status: INITIAL  4.  Improve left shoulder strength to 70% or greater of the uninvolved right at 12 weeks post-surgery Baseline: Deferred secondary to being less than 2  weeks post-surgery Goal status: INITIAL  5.  Etana will be independent with her long-term home maintenance exercise program at discharge Baseline: Started 11/25/2023 Goal status: INITIAL  6.  Ambulate when able to reach overhead and behind her back without increased pain or functional limitations at discharge Baseline: Unable at evaluation Goal status: INITIAL  PLAN:  PT FREQUENCY:  2-3 x a week  PT DURATION: 12 weeks  PLANNED INTERVENTIONS: 97110-Therapeutic exercises, 97530- Therapeutic activity, 97112- Neuromuscular re-education, 97535- Self Care, 29518- Manual therapy, 97016- Vasopneumatic device, Patient/Family education, Joint mobilization, Cryotherapy, and Moist heat  PLAN FOR NEXT SESSION: Passive range/AAROM focus.  Vaso/estim post treatment as indicated.   No strengthening for at least 3 weeks per MD note on 11/24/2023   Cherlyn Cushing PT, MPT 12/01/23  5:08 PM

## 2023-12-01 NOTE — Telephone Encounter (Signed)
That is not how we want her to take it so cannot write it that way

## 2023-12-01 NOTE — Telephone Encounter (Signed)
Cannot do anything stronger, but I will send in robaxin to take in addition to.  Can also add tylenol/ibuprofen if needed

## 2023-12-01 NOTE — Telephone Encounter (Signed)
Spoke with patient. She was made aware about the medication. She states that she cannot get her hydrocodone filled because it says only 1 per day. Explained that we cannot write it for more often. She said that she has been taking it more often than prescribed due to pain.

## 2023-12-08 ENCOUNTER — Encounter: Payer: Self-pay | Admitting: Rehabilitative and Restorative Service Providers"

## 2023-12-14 ENCOUNTER — Ambulatory Visit (INDEPENDENT_AMBULATORY_CARE_PROVIDER_SITE_OTHER): Payer: Worker's Compensation | Admitting: Rehabilitative and Restorative Service Providers"

## 2023-12-14 ENCOUNTER — Encounter: Payer: Self-pay | Admitting: Rehabilitative and Restorative Service Providers"

## 2023-12-14 DIAGNOSIS — M25612 Stiffness of left shoulder, not elsewhere classified: Secondary | ICD-10-CM

## 2023-12-14 DIAGNOSIS — M25512 Pain in left shoulder: Secondary | ICD-10-CM | POA: Diagnosis not present

## 2023-12-14 DIAGNOSIS — M6281 Muscle weakness (generalized): Secondary | ICD-10-CM | POA: Diagnosis not present

## 2023-12-14 DIAGNOSIS — R600 Localized edema: Secondary | ICD-10-CM

## 2023-12-14 DIAGNOSIS — R293 Abnormal posture: Secondary | ICD-10-CM | POA: Diagnosis not present

## 2023-12-14 DIAGNOSIS — G8929 Other chronic pain: Secondary | ICD-10-CM

## 2023-12-14 NOTE — Therapy (Signed)
 OUTPATIENT PHYSICAL THERAPY TREATMENT/PROGRESS NOTE  Progress Note Reporting Period 11/25/2023 to 12/14/2023  See note below for Objective Data and Assessment of Progress/Goals.     Patient Name: Andrea Hood MRN: [id number] DOB:1975/10/25, 48 y.o., female Today's Date: 12/14/2023  END OF SESSION:  PT End of Session - 12/14/23 1412     Visit Number 4    Number of Visits 24    Date for PT Re-Evaluation 02/10/24    Authorization Type Work Comp    Authorization Time Period Per Misty Stanley from [place] on eval    Authorization - Number of Visits 12    Progress Note Due on Visit 12    PT Start Time 1350    PT Stop Time 1430    PT Time Calculation (min) 40 min    Activity Tolerance Patient tolerated treatment well;Patient limited by pain;No increased pain    Behavior During Therapy WFL for tasks assessed/performed              Past Medical History:  Diagnosis Date   Asthma    Bipolar 2 disorder (HCC)    BRCA negative 12/2019   MyRisk neg; IBIS=13.4%/riskscore=25.3%   Depression    Family history of breast cancer    Family history of ovarian cancer    Fatty liver    Gastric ulcer    Migraines    Pituitary cyst (HCC)    Seizures (HCC)    Past Surgical History:  Procedure Laterality Date   ABDOMINAL HYSTERECTOMY     CHOLECYSTECTOMY     Patient Active Problem List   Diagnosis Date Noted   Pain in left shoulder 08/02/2023   Backache 11/19/2010   INSOMNIA-SLEEP DISORDER-UNSPEC 08/27/2010   Anxiety state 03/24/2009   EATING DISORDER, UNSPECIFIED 03/24/2009   DEPRESSION 03/24/2009   Migraine headache 03/24/2009   GERD 03/24/2009   Gastric ulcer 03/24/2009   FATIGUE 03/24/2009    PCP: Jackson Latino, MD  REFERRING PROVIDER: Tarry Kos, MD  REFERRING DIAG: [phone] (ICD-10-CM) - Superior labrum anterior-to-posterior (SLAP) tear of left shoulder  THERAPY DIAG:  Abnormal posture  Muscle weakness (generalized)  Chronic left shoulder pain  Stiffness of left  shoulder, not elsewhere classified  Localized edema  Rationale for Evaluation and Treatment: Rehabilitation  ONSET DATE: 11/17/2023 surgery was biceps tenodesis and arthroscopic debridement  SUBJECTIVE:                                                                                                                                                                                      SUBJECTIVE STATEMENT: Kirra notes that sleep is still difficult.  She reports continued HEP compliance.    PERTINENT  HISTORY: Asthma, bipolar, back pain, insomnia, depression, seizures, migraines, history of Rt shoulder surgery (clavicle excision)  PAIN:  NPRS scale: 5-7/10 this week, nights are difficult Pain location: Lt shoulder to the elbow Pain description: Ache, can be sharp at night Aggravating factors: Pain is constant, lifting or reaching is worst, at night is bad Relieving factors: Hydrocodone and a muscle relaxer   PRECAUTIONS: Shoulder arthroscopy of SLAP tear with biceps tenodesis No strengthening for at least 3 weeks per MD note on 11/24/2023  RED FLAGS: None   WEIGHT BEARING RESTRICTIONS: No  FALLS:  Has patient fallen in last 6 months? 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  NEXT MD VISIT: 12/15/2023 @ 10:15  OBJECTIVE:  Note: Objective measures were completed at Evaluation unless otherwise noted.  DIAGNOSTIC FINDINGS:  No fracture or dislocation of the left shoulder. Mild glenohumeral arthrosis.  MRI at [facility] showed the SLAP tear  PATIENT SURVEYS:  11/25/2023 FOTO 44 (risk-adjusted 34, Goal 58 in 19 visits)  COGNITION: 11/25/2023 Overall cognitive status: Within functional  limits for tasks assessed     SENSATION: 11/25/2023 [facility]  POSTURE: 11/25/2023 Mildly flexed  UPPER EXTREMITY ROM:     Left/Right 11/25/2023 PROM Left 11/30/2023 Supine PROM Left 12/14/2023  Shoulder flexion 85/170 120 110  Shoulder extension     Shoulder abduction  120   Shoulder horizontal adduction 0/40  20  Shoulder internal rotation 30/80  50  Shoulder external rotation 15/95 40 in 30 deg abduction 50  Elbow flexion     Elbow extension     Wrist flexion     Wrist extension     Wrist ulnar deviation     Wrist radial deviation     Wrist pronation     Wrist supination     (Blank rows = not tested)  UPPER EXTREMITY STRENGTH: Deferred at evaluation secondary to being less than 2 weeks post-surgery  MMT    Shoulder flexion    Shoulder extension    Shoulder abduction    Shoulder adduction    Shoulder internal rotation    Shoulder external rotation    Middle trapezius    Lower trapezius    Elbow flexion    Elbow extension    Wrist flexion    Wrist extension    Wrist ulnar deviation    Wrist radial deviation    Wrist pronation    Wrist supination    Grip strength (lbs)    (Blank rows = not tested)                                                                                                             TREATMENT:         DATE:  12/14/2023 Shoulder blade pinches 10 x  5 seconds Codmans forward/back; side to side; CW; CCW 30 x each PROM for IR and ER with particular care with ER (70 degrees scaption for PROM) 10 minutes  Functional Activities: Reaching:  Supine active-assisted arm raises 20 x 3 seconds Overhead function: Supine AAROM shoulder flexion 10 x 3 seconds (protract 1st, palm in) Reaching behind: Supine IR and ER stretch 20 x 10 seconds  Vaso left shoulder 10 minutes Medium Pressure 34*   12/01/2023 Shoulder blade pinches 10 x 5 seconds Codmans forward/back; side to side; CW; CCW 20 x each PROM for IR and ER with particular care with ER (70 degrees  scaption for PROM) 20 minutes  Functional Activities: Reaching:  Supine active-assisted arm raises 20 x 3 seconds Overhead function: Supine AAROM shoulder flexion 10 x 2 seconds (protract 1st, palm in)  Vaso left shoulder 10 minutes Low Pressure 34*   11/30/2023 Manual: Supine Rt shoulder inferior glides g2, posterior glide c ER passive range.  Distraction c flexion,scaption and abduction movement.   Therex Supine scapular retraction hold 5 sec x 10 Supine Lt shoulder AAROM flexion with Rt arm to tolerance 2-3 sec hold x 10   Verbal review of HEP performed with cues for techniques (performed in part during part of vaso/estim time)  Neuro Re-ed Education given regarding neuro sensitization and presentation related to chronic pain symptoms. Used visual diagram to explain.  Pt understood discussion. Performed during majority of vaso/estim time.   Estim/Vaso IFC to Lt shoulder/upper arm to tolerance with use of vaso at 34 deg low compression 10 mins    PATIENT EDUCATION: Education details: See above Person educated: Patient Education method: Explanation, Demonstration, Tactile cues, Verbal cues, and Handouts Education comprehension: verbalized understanding, returned demonstration, verbal cues required, tactile cues required, and needs further education  HOME EXERCISE PROGRAM: Access Code: [email or web address] URL: [email or web address] Date: 12/14/2023 Prepared by: Pauletta Browns  Exercises - Standing Scapular Retraction  - 5 x daily - 7 x weekly - 1 sets - 5 reps - 5 second hold - Pendulums  - 3-5 x daily - 7 x weekly - 1 sets - 30 reps - Supine Shoulder Flexion AAROM  - 3 x daily - 7 x weekly - 1 sets - 10 reps - 3-5 seconds hold - Supine Scapular Protraction in Flexion with Dumbbells  - 3 x daily - 7 x weekly - 1 sets - 20-30 reps - 3 seconds hold - Supine Shoulder External Rotation Stretch  - 3 x daily - 7 x weekly - 1 sets - 20 reps - 5 seconds hold - Supine Shoulder  Internal Rotation Stretch  - 3 x daily - 7 x weekly - 1 sets - 20 reps - 5 seconds hold - Sidelying Shoulder External Rotation Dumbbell  - 2-3 x daily - 7 x weekly - 1 sets - 10 reps - 3 seconds hold  ASSESSMENT:  CLINICAL IMPRESSION: Hazle is making early objective active range of motion progress towards long-term goals.  She reports good home exercise program compliance and her performance of her home exercises in the office demonstrate this.  Amariah has having particular difficulty sleeping and notes she only has about 6 hours of sleep combined over the last 3 nights.  This is certainly affecting her physical therapy and overall function.  If pain can be better controlled at night it would likely assist her in meeting long-term goals.  OBJECTIVE IMPAIRMENTS: decreased activity tolerance, decreased coordination, decreased endurance, decreased knowledge of condition, decreased ROM, decreased strength, decreased  safety awareness, increased edema, impaired perceived functional ability, impaired UE functional use, and pain.   ACTIVITY LIMITATIONS: carrying, lifting, sleeping, dressing, and reach over head  PARTICIPATION LIMITATIONS: community activity and occupation  PERSONAL FACTORS: Asthma, bipolar, back pain, insomnia, depression, seizures, migraines, history of Rt shoulder surgery (clavicle excision) are also affecting patient's functional outcome.   REHAB POTENTIAL: Good  CLINICAL DECISION MAKING: Stable/uncomplicated  EVALUATION COMPLEXITY: Low   GOALS: Goals reviewed with patient? Yes  SHORT TERM GOALS: Target date: 01/06/2024  Improve left shoulder active range of motion for flexion to 135 degrees; IR to 45 degrees; ER to 45 degrees and horizontal adduction to 20 degrees Baseline: 85; 30; 15; 0 respectively Goal status: Partially Met 12/14/2023  2.  Tonimarie will be independent with her day 1 home exercise program Baseline: Started 11/25/2023 Goal status: Met 12/01/2023  3.  Hoda  will be out of her sling 100% of the time without increased left shoulder pain Baseline: In the sling 90+ percent of the time at evaluation Goal status: Partially Met 12/14/2023   LONG TERM GOALS: Target date: 02/10/2024  Improve FOTO to 58 in 19 visits. Baseline: 44, risk adjusted 34 Goal status: INITIAL  2.  Loribeth will report left shoulder pain consistently 0-2/10 on the visual analog scale Baseline: 3-6/10 Goal status: INITIAL  3.  Improve left shoulder active range of motion to 90% or greater of: Flexion 170 degrees; external rotation 90 degrees; internal rotation 60 degrees and horizontal adduction 40 degrees Baseline: See short-term goals Goal status: INITIAL  4.  Improve left shoulder strength to 70% or greater of the uninvolved right at 12 weeks post-surgery Baseline: Deferred secondary to being less than 2 weeks post-surgery Goal status: INITIAL  5.  Evann will be independent with her long-term home maintenance exercise program at discharge Baseline: Started 11/25/2023 Goal status: INITIAL  6.  Ambulate when able to reach overhead and behind her back without increased pain or functional limitations at discharge Baseline: Unable at evaluation Goal status: INITIAL  PLAN:  PT FREQUENCY:  2-3 x a week  PT DURATION: 8-9 weeks  PLANNED INTERVENTIONS: 97110-Therapeutic exercises, 97530- Therapeutic activity, 97112- Neuromuscular re-education, 97535- Self Care, 81191- Manual therapy, 97016- Vasopneumatic device, Patient/Family education, Joint mobilization, Cryotherapy, and Moist heat  PLAN FOR NEXT SESSION: AROM and appropriate conservative strength and functional progressions.  No strengthening for at least 3 weeks per MD note on 11/24/2023   Cherlyn Cushing PT, MPT 12/14/23  6:10 PM

## 2023-12-15 ENCOUNTER — Ambulatory Visit (INDEPENDENT_AMBULATORY_CARE_PROVIDER_SITE_OTHER): Payer: Worker's Compensation | Admitting: Physician Assistant

## 2023-12-15 ENCOUNTER — Encounter: Payer: Self-pay | Admitting: Physician Assistant

## 2023-12-15 DIAGNOSIS — Z9889 Other specified postprocedural states: Secondary | ICD-10-CM

## 2023-12-15 NOTE — Progress Notes (Signed)
   Post-Op Visit Note   Patient: Andrea Hood           Date of Birth: Dec 31, 1975           MRN: [id number] Visit Date: 12/15/2023 PCP: Jackson Latino, MD   Assessment & Plan:  Chief Complaint:  Chief Complaint  Patient presents with   Left Shoulder - Follow-up    Left shoulder scope 11/17/2023   Visit Diagnoses:  1. S/P arthroscopy of left shoulder     Plan: Patient is a pleasant 48 year old female who comes in today 4 weeks status post left shoulder arthroscopic debridement biceps tenodesis 11/17/2023.  She has been doing better.  She is taking Norco and Robaxin.  She is in physical therapy.  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  Examination of the left shoulder reveals forward flexion to 110 degrees.  Internal and external rotation to 50 degrees.  She is neurovascularly intact distally.  At this point, she will continue with physical therapy.  Continue out of work for another 4 weeks.  She will follow-up in 4 weeks for recheck.  Call with concerns or questions.  Follow-Up Instructions: Return in about 4 weeks (around 01/12/2024).   Orders:  No orders of the defined types were placed in this encounter.  No orders of the defined types were placed in this encounter.   Imaging: No results found.  PMFS History: Patient Active Problem List   Diagnosis Date Noted   Pain in left shoulder 08/02/2023   Backache 11/19/2010   INSOMNIA-SLEEP DISORDER-UNSPEC 08/27/2010   Anxiety state 03/24/2009   EATING DISORDER, UNSPECIFIED 03/24/2009   DEPRESSION 03/24/2009   Migraine headache 03/24/2009   GERD 03/24/2009   Gastric ulcer 03/24/2009   FATIGUE 03/24/2009   Past Medical History:  Diagnosis Date   Asthma    Bipolar 2 disorder (HCC)    BRCA negative 12/2019   MyRisk neg; IBIS=13.4%/riskscore=25.3%   Depression    Family history of breast cancer    Family history of ovarian cancer    Fatty liver    Gastric ulcer     Migraines    Pituitary cyst (HCC)    Seizures (HCC)     Family History  Problem Relation Age of Onset   Cervical cancer Mother 57   Ovarian cancer Mother 8   Breast cancer Maternal Grandmother 65   Breast cancer Maternal Aunt 35    Past Surgical History:  Procedure Laterality Date   ABDOMINAL HYSTERECTOMY     CHOLECYSTECTOMY     Social History   Occupational History   Not on file  Tobacco Use   Smoking status: Former    Current packs/day: 0.50    Types: Cigarettes   Smokeless tobacco: Never  Vaping Use   Vaping status: Never Used  Substance and Sexual Activity   Alcohol use: Not Currently   Drug use: No   Sexual activity: Yes    Birth control/protection: Surgical

## 2023-12-16 ENCOUNTER — Encounter: Payer: Self-pay | Admitting: Rehabilitative and Restorative Service Providers"

## 2023-12-21 ENCOUNTER — Encounter: Payer: Self-pay | Admitting: Rehabilitative and Restorative Service Providers"

## 2023-12-23 ENCOUNTER — Encounter: Payer: Self-pay | Admitting: Rehabilitative and Restorative Service Providers"

## 2023-12-23 ENCOUNTER — Ambulatory Visit (INDEPENDENT_AMBULATORY_CARE_PROVIDER_SITE_OTHER): Payer: Worker's Compensation | Admitting: Rehabilitative and Restorative Service Providers"

## 2023-12-23 DIAGNOSIS — M25612 Stiffness of left shoulder, not elsewhere classified: Secondary | ICD-10-CM | POA: Diagnosis not present

## 2023-12-23 DIAGNOSIS — G8929 Other chronic pain: Secondary | ICD-10-CM

## 2023-12-23 DIAGNOSIS — M6281 Muscle weakness (generalized): Secondary | ICD-10-CM

## 2023-12-23 DIAGNOSIS — R600 Localized edema: Secondary | ICD-10-CM

## 2023-12-23 DIAGNOSIS — M25512 Pain in left shoulder: Secondary | ICD-10-CM

## 2023-12-23 DIAGNOSIS — R293 Abnormal posture: Secondary | ICD-10-CM

## 2023-12-23 NOTE — Therapy (Signed)
 OUTPATIENT PHYSICAL THERAPY TREATMENT NOTE   Patient Name: Andrea Hood MRN: [id number] DOB:Mar 17, 1976, 48 y.o., female Today's Date: 12/23/2023  END OF SESSION:  PT End of Session - 12/23/23 1227     Visit Number 5    Number of Visits 24    Date for PT Re-Evaluation 02/10/24    Authorization Type Work Comp    Authorization Time Period Per Andrea Hood from [place] on eval    Authorization - Number of Visits 12    Progress Note Due on Visit 12    PT Start Time 1145    PT Stop Time 1230    PT Time Calculation (min) 45 min    Activity Tolerance Patient tolerated treatment well;Patient limited by pain;No increased pain    Behavior During Therapy WFL for tasks assessed/performed              Past Medical History:  Diagnosis Date   Asthma    Bipolar 2 disorder (HCC)    BRCA negative 12/2019   MyRisk neg; IBIS=13.4%/riskscore=25.3%   Depression    Family history of breast cancer    Family history of ovarian cancer    Fatty liver    Gastric ulcer    Migraines    Pituitary cyst (HCC)    Seizures (HCC)    Past Surgical History:  Procedure Laterality Date   ABDOMINAL HYSTERECTOMY     CHOLECYSTECTOMY     Patient Active Problem List   Diagnosis Date Noted   Pain in left shoulder 08/02/2023   Backache 11/19/2010   INSOMNIA-SLEEP DISORDER-UNSPEC 08/27/2010   Anxiety state 03/24/2009   EATING DISORDER, UNSPECIFIED 03/24/2009   DEPRESSION 03/24/2009   Migraine headache 03/24/2009   GERD 03/24/2009   Gastric ulcer 03/24/2009   FATIGUE 03/24/2009    PCP: Andrea Latino, MD  REFERRING PROVIDER: Tarry Kos, MD  REFERRING DIAG: [phone] (ICD-10-CM) - Superior labrum anterior-to-posterior (SLAP) tear of left shoulder  THERAPY DIAG:  Abnormal posture  Muscle weakness (generalized)  Chronic left shoulder pain  Stiffness of left shoulder, not elsewhere classified  Localized edema  Rationale for Evaluation and Treatment: Rehabilitation  ONSET DATE: 11/17/2023 surgery  was biceps tenodesis and arthroscopic debridement  SUBJECTIVE:                                                                                                                                                                                      SUBJECTIVE STATEMENT: Today is the start of week 6 post-surgery.  Andrea Hood reports 4-5 hours of uninterrupted sleep.  She notes difficulty reaching overhead or behind the back.   PERTINENT HISTORY: Asthma, bipolar, back pain, insomnia, depression, seizures, migraines,  history of Rt shoulder surgery (clavicle excision)  PAIN:  NPRS scale: 2-5/10 this week, nights are getting better Pain location: Lt shoulder to the elbow Pain description: Ache, can be sharp at night Aggravating factors: Pain is constant, lifting or reaching is worst, at night is improving Relieving factors: Tylenol only now, no more Hydrocodone or muscle relaxer   PRECAUTIONS: Shoulder arthroscopy of SLAP tear with biceps tenodesis No strengthening for at least 3 weeks per MD note on 11/24/2023  RED FLAGS: None   WEIGHT BEARING RESTRICTIONS: No  FALLS:  Has patient fallen in last 6 months? 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  NEXT MD VISIT: April 1st @ 10:15  OBJECTIVE:  Note: Objective measures were completed at Evaluation unless otherwise noted.  DIAGNOSTIC FINDINGS:  No fracture or dislocation of the left shoulder. Mild glenohumeral arthrosis.  MRI at [facility] showed the SLAP tear  PATIENT SURVEYS:  11/25/2023 FOTO 44 (risk-adjusted 34, Goal 58 in 19 visits)  COGNITION: 11/25/2023 Overall cognitive status: Within functional limits for tasks  assessed     SENSATION: 11/25/2023 [facility]  POSTURE: 11/25/2023 Mildly flexed  UPPER EXTREMITY ROM:     Left/Right 11/25/2023 PROM Left 11/30/2023 Supine PROM Left 12/14/2023 Left 12/23/2023  Shoulder flexion 85/170 120 110 140  Shoulder extension      Shoulder abduction  120    Shoulder horizontal adduction 0/40  20 30  Shoulder internal rotation 30/80  50 70 at 70 degrees abduction  Shoulder external rotation 15/95 40 in 30 deg abduction 50 75 at 70 degrees abduction  Elbow flexion      Elbow extension      Wrist flexion      Wrist extension      Wrist ulnar deviation      Wrist radial deviation      Wrist pronation      Wrist supination      (Blank rows = not tested)  UPPER EXTREMITY STRENGTH: Deferred at evaluation secondary to being less than 2 weeks post-surgery  MMT    Shoulder flexion    Shoulder extension    Shoulder abduction    Shoulder adduction    Shoulder internal rotation    Shoulder external rotation    Middle trapezius    Lower trapezius    Elbow flexion    Elbow extension    Wrist flexion    Wrist extension    Wrist ulnar deviation    Wrist radial deviation    Wrist pronation    Wrist supination    Grip strength (lbs)    (Blank rows = not tested)  TREATMENT:         DATE:  12/23/2023 Shoulder blade pinches 5 x 5 seconds AAROM for IR and ER (70 degrees abduction) 20 x 10 seconds each direction Side-lie ER 0# 2 sets 10 x slow eccentrics Posterior capsule stretch 5 x 10 seconds  Functional Activities: Reaching:  Supine active-assisted arm raises 20 x 3 seconds, 1# Overhead function: Supine AAROM shoulder flexion 10 x 5 seconds (protract 1st, palm in) Reaching behind: Supine IR and ER stretch 20 x 10 seconds (reaching) Thumb up the back stretch (reach behind the back) 10 x 10 seconds  Ice pack left shoulder 5 minutes  post-treatment   12/14/2023 Shoulder blade pinches 10 x 5 seconds Codmans forward/back; side to side; CW; CCW 30 x each PROM for IR and ER with particular care with ER (70 degrees scaption for PROM) 10 minutes  Functional Activities: Reaching:  Supine active-assisted arm raises 20 x 3 seconds Overhead function: Supine AAROM shoulder flexion 10 x 3 seconds (protract 1st, palm in) Reaching behind: Supine IR and ER stretch 20 x 10 seconds  Vaso left shoulder 10 minutes Medium Pressure 34*   12/01/2023 Shoulder blade pinches 10 x 5 seconds Codmans forward/back; side to side; CW; CCW 20 x each PROM for IR and ER with particular care with ER (70 degrees scaption for PROM) 20 minutes  Functional Activities: Reaching:  Supine active-assisted arm raises 20 x 3 seconds Overhead function: Supine AAROM shoulder flexion 10 x 2 seconds (protract 1st, palm in)  Vaso left shoulder 10 minutes Low Pressure 34*   PATIENT EDUCATION: Education details: See above Person educated: Patient Education method: Explanation, Demonstration, Tactile cues, Verbal cues, and Handouts Education comprehension: verbalized understanding, returned demonstration, verbal cues required, tactile cues required, and needs further education  HOME EXERCISE PROGRAM: Access Code: [email or web address] URL: [email or web address] Date: 12/23/2023 Prepared by: Pauletta Browns  Exercises - Standing Scapular Retraction  - 5 x daily - 7 x weekly - 1 sets - 5 reps - 5 second hold - Pendulums  - 3-5 x daily - 1 x weekly - 1 sets - 30 reps - Supine Shoulder Flexion AAROM  - 2 x daily - 7 x weekly - 1 sets - 10 reps - 5 seconds hold - Supine Scapular Protraction in Flexion with Dumbbells  - 2 x daily - 7 x weekly - 1 sets - 20-30 reps - 3 seconds hold - Supine Shoulder External Rotation Stretch  - 2 x daily - 7 x weekly - 1 sets - 20 reps - 5 seconds hold - Supine Shoulder Internal Rotation Stretch  - 2 x daily - 7 x weekly -  1 sets - 20 reps - 5 seconds hold - Sidelying Shoulder External Rotation Dumbbell  - 1 x daily - 7 x weekly - 2 sets - 10 reps - 3 seconds hold - Standing Shoulder Posterior Capsule Stretch  - 1 x daily - 7 x weekly - 1 sets - 10 reps - 10 seconds hold - Standing Shoulder Internal Rotation Stretch with Hands Behind Back  - 1 x daily - 7 x weekly - 1 sets - 10 reps - 10 seconds hold  ASSESSMENT:  CLINICAL IMPRESSION: Meriem is doing much better with her sleep as compared to the last time I saw her.  Active range of motion is also improved in all directions assessed.  We made some progressions for functional reaching and general mobility today and Karelyn will continue to be conservatively and appropriately progressed to  meet long-term goals.  OBJECTIVE IMPAIRMENTS: decreased activity tolerance, decreased coordination, decreased endurance, decreased knowledge of condition, decreased ROM, decreased strength, decreased safety awareness, increased edema, impaired perceived functional ability, impaired UE functional use, and pain.   ACTIVITY LIMITATIONS: carrying, lifting, sleeping, dressing, and reach over head  PARTICIPATION LIMITATIONS: community activity and occupation  PERSONAL FACTORS: Asthma, bipolar, back pain, insomnia, depression, seizures, migraines, history of Rt shoulder surgery (clavicle excision) are also affecting patient's functional outcome.   REHAB POTENTIAL: Good  CLINICAL DECISION MAKING: Stable/uncomplicated  EVALUATION COMPLEXITY: Low   GOALS: Goals reviewed with patient? Yes  SHORT TERM GOALS: Target date: 01/06/2024  Improve left shoulder active range of motion for flexion to 135 degrees; IR to 45 degrees; ER to 45 degrees and horizontal adduction to 20 degrees Baseline: 85; 30; 15; 0 respectively Goal status: Met 12/23/2023  2.  Laurene will be independent with her day 1 home exercise program Baseline: Started 11/25/2023 Goal status: Met 12/01/2023  3.  Cheetara will be  out of her sling 100% of the time without increased left shoulder pain Baseline: In the sling 90+ percent of the time at evaluation Goal status: Met 12/23/2023   LONG TERM GOALS: Target date: 02/10/2024  Improve FOTO to 58 in 19 visits. Baseline: 44, risk adjusted 34 Goal status: INITIAL  2.  Esthela will report left shoulder pain consistently 0-2/10 on the visual analog scale Baseline: 3-6/10 Goal status: INITIAL  3.  Improve left shoulder active range of motion to 90% or greater of: Flexion 170 degrees; external rotation 90 degrees; internal rotation 60 degrees and horizontal adduction 40 degrees Baseline: See short-term goals Goal status: INITIAL  4.  Improve left shoulder strength to 70% or greater of the uninvolved right at 12 weeks post-surgery Baseline: Deferred secondary to being less than 2 weeks post-surgery Goal status: INITIAL  5.  Baani will be independent with her long-term home maintenance exercise program at discharge Baseline: Started 11/25/2023 Goal status: INITIAL  6.  Ambulate when able to reach overhead and behind her back without increased pain or functional limitations at discharge Baseline: Unable at evaluation Goal status: INITIAL  PLAN:  PT FREQUENCY:  2-3 x a week  PT DURATION: 7 weeks  PLANNED INTERVENTIONS: 97110-Therapeutic exercises, 97530- Therapeutic activity, 97112- Neuromuscular re-education, 97535- Self Care, 16109- Manual therapy, 97016- Vasopneumatic device, Patient/Family education, Joint mobilization, Cryotherapy, and Moist heat  PLAN FOR NEXT SESSION: AROM and appropriate conservative strength and functional progressions.  No strengthening for at least 3 weeks per MD note on 11/24/2023   Cherlyn Cushing PT, MPT 12/23/23  3:07 PM

## 2023-12-28 ENCOUNTER — Encounter: Payer: Self-pay | Admitting: Rehabilitative and Restorative Service Providers"

## 2023-12-30 ENCOUNTER — Encounter: Payer: Self-pay | Admitting: Rehabilitative and Restorative Service Providers"

## 2024-01-17 ENCOUNTER — Ambulatory Visit (INDEPENDENT_AMBULATORY_CARE_PROVIDER_SITE_OTHER): Payer: Worker's Compensation | Admitting: Orthopaedic Surgery

## 2024-01-17 ENCOUNTER — Encounter: Payer: Self-pay | Admitting: Orthopaedic Surgery

## 2024-01-17 DIAGNOSIS — Z9889 Other specified postprocedural states: Secondary | ICD-10-CM

## 2024-01-17 MED ORDER — METHOCARBAMOL 750 MG PO TABS: 750.0000 mg | ORAL_TABLET | Freq: Two times a day (BID) | ORAL | 3 refills | Status: DC | PRN

## 2024-01-17 NOTE — Progress Notes (Signed)
   Post-Op Visit Note   Patient: Andrea Hood           Date of Birth: 23-Dec-1975           MRN: [id number] Visit Date: 01/17/2024 PCP: Jackson Latino, MD   Assessment & Plan:  Chief Complaint:  Chief Complaint  Patient presents with   Left Shoulder - Follow-up    Left shoulder scope 11/17/2023   Visit Diagnoses:  1. S/P arthroscopy of left shoulder     Plan: Veronnica is here for postop check.  She is roughly 9 weeks from a left biceps tenodesis.  Physical therapy was interrupted for about 2 weeks.  She is sleeping about 4 to 5 hours a night uninterrupted.  She still feels weakness in the arm.  Examination shows fully healed surgical scars.  Forward flexion to 145 degrees.  External rotation is 75 degrees.  Abduction to 120 degrees.  Strength is improving overall.  I would like to have her continue physical therapy for another 4 weeks and then we can reevaluate if she needs work conditioning.  Continue desk work for now.  Case worker present today.  Follow-Up Instructions: Return in about 4 weeks (around 02/14/2024).   Orders:  Orders Placed This Encounter  Procedures   Ambulatory referral to Physical Therapy   Meds ordered this encounter  Medications   methocarbamol (ROBAXIN-750) 750 MG tablet    Sig: Take 1 tablet (750 mg total) by mouth 2 (two) times daily as needed for muscle spasms.    Dispense:  20 tablet    Refill:  3    Imaging: No results found.  PMFS History: Patient Active Problem List   Diagnosis Date Noted   Pain in left shoulder 08/02/2023   Backache 11/19/2010   INSOMNIA-SLEEP DISORDER-UNSPEC 08/27/2010   Anxiety state 03/24/2009   EATING DISORDER, UNSPECIFIED 03/24/2009   DEPRESSION 03/24/2009   Migraine headache 03/24/2009   GERD 03/24/2009   Gastric ulcer 03/24/2009   FATIGUE 03/24/2009   Past Medical History:  Diagnosis Date   Asthma    Bipolar 2 disorder (HCC)    BRCA negative 12/2019   MyRisk neg; IBIS=13.4%/riskscore=25.3%    Depression    Family history of breast cancer    Family history of ovarian cancer    Fatty liver    Gastric ulcer    Migraines    Pituitary cyst (HCC)    Seizures (HCC)     Family History  Problem Relation Age of Onset   Cervical cancer Mother 64   Ovarian cancer Mother 21   Breast cancer Maternal Grandmother 59   Breast cancer Maternal Aunt 35    Past Surgical History:  Procedure Laterality Date   ABDOMINAL HYSTERECTOMY     CHOLECYSTECTOMY     Social History   Occupational History   Not on file  Tobacco Use   Smoking status: Former    Current packs/day: 0.50    Types: Cigarettes   Smokeless tobacco: Never  Vaping Use   Vaping status: Never Used  Substance and Sexual Activity   Alcohol use: Not Currently   Drug use: No   Sexual activity: Yes    Birth control/protection: Surgical

## 2024-01-19 ENCOUNTER — Encounter: Payer: Self-pay | Admitting: Rehabilitative and Restorative Service Providers"

## 2024-02-08 ENCOUNTER — Encounter: Payer: Worker's Compensation | Admitting: Rehabilitative and Restorative Service Providers"

## 2024-02-10 ENCOUNTER — Encounter: Payer: Worker's Compensation | Admitting: Physical Therapy

## 2024-02-14 ENCOUNTER — Ambulatory Visit: Payer: Self-pay | Admitting: Orthopaedic Surgery

## 2024-02-24 ENCOUNTER — Encounter: Payer: Self-pay | Admitting: Physical Therapy

## 2024-02-24 ENCOUNTER — Ambulatory Visit (INDEPENDENT_AMBULATORY_CARE_PROVIDER_SITE_OTHER): Payer: Worker's Compensation | Admitting: Physical Therapy

## 2024-02-24 DIAGNOSIS — R293 Abnormal posture: Secondary | ICD-10-CM | POA: Diagnosis not present

## 2024-02-24 DIAGNOSIS — M6281 Muscle weakness (generalized): Secondary | ICD-10-CM

## 2024-02-24 DIAGNOSIS — M25612 Stiffness of left shoulder, not elsewhere classified: Secondary | ICD-10-CM

## 2024-02-24 DIAGNOSIS — M25512 Pain in left shoulder: Secondary | ICD-10-CM

## 2024-02-24 DIAGNOSIS — G8929 Other chronic pain: Secondary | ICD-10-CM

## 2024-02-24 NOTE — Therapy (Signed)
 OUTPATIENT PHYSICAL THERAPY TREATMENT NOTE   Patient Name: Andrea Hood MRN: [id number] DOB:07/25/1976, 48 y.o., female Today's Date: 02/24/2024  END OF SESSION:  PT End of Session - 02/24/24 0919     Visit Number 6    Number of Visits 24    Date for PT Re-Evaluation 04/20/24    Authorization Type Work Comp    PT Start Time 0851    PT Stop Time 0930    PT Time Calculation (min) 39 min    Activity Tolerance Patient tolerated treatment well;Patient limited by pain    Behavior During Therapy [facility] for tasks assessed/performed;Flat affect               Past Medical History:  Diagnosis Date   Asthma    Bipolar 2 disorder (HCC)    BRCA negative 12/2019   MyRisk neg; IBIS=13.4%/riskscore=25.3%   Depression    Family history of breast cancer    Family history of ovarian cancer    Fatty liver    Gastric ulcer    Migraines    Pituitary cyst (HCC)    Seizures (HCC)    Past Surgical History:  Procedure Laterality Date   ABDOMINAL HYSTERECTOMY     CHOLECYSTECTOMY     Patient Active Problem List   Diagnosis Date Noted   Pain in left shoulder 08/02/2023   Backache 11/19/2010   INSOMNIA-SLEEP DISORDER-UNSPEC 08/27/2010   Anxiety state 03/24/2009   EATING DISORDER, UNSPECIFIED 03/24/2009   DEPRESSION 03/24/2009   Migraine headache 03/24/2009   GERD 03/24/2009   Gastric ulcer 03/24/2009   FATIGUE 03/24/2009    PCP: Donette Furlong, MD  REFERRING PROVIDER: Wes Hamman, MD  REFERRING DIAG: [phone] (ICD-10-CM) - Superior labrum anterior-to-posterior (SLAP) tear of left shoulder  THERAPY DIAG:  Abnormal posture  Muscle weakness (generalized)  Chronic left shoulder pain  Stiffness of left shoulder, not elsewhere classified  Rationale for Evaluation and Treatment: Rehabilitation  ONSET DATE: 11/17/2023 surgery was biceps tenodesis and arthroscopic debridement  SUBJECTIVE:                                                                                                                                                                                       SUBJECTIVE STATEMENT:  I had a lot come up, some family stuff and I was sick. Shoulder is better but I still have issues like pulling up pants, I can't reach for things like I should be able to, its hard to put on seat belt and it can hurt to drive. 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 Still having issues with sleep due to pain. Still can't reach around to hook bra or anything like this. I can barely lift a 5# thing of PB without shoulder hurting.   PERTINENT HISTORY: Asthma, bipolar, back pain, insomnia, depression, seizures, migraines, history of Rt shoulder surgery (clavicle excision)  PAIN:  NPRS scale: 5/10 now, in past 2 weeks at worst has gotten to 8/10 Pain location: Lt shoulder (deep) Pain description:  stabbing, deep pain, sharp; comes and goes  Aggravating factors: functional use, sleep Relieving factors:  not using it   PRECAUTIONS: Shoulder arthroscopy of SLAP tear with biceps tenodesis- about 14 weeks out from surgery as of 02/24/24   RED FLAGS: None   WEIGHT BEARING RESTRICTIONS: No  FALLS:  Has patient fallen in last 6 months? 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  NEXT MD VISIT: May 20th 2025  OBJECTIVE:  Note: Objective measures were completed at Evaluation unless otherwise noted.  DIAGNOSTIC FINDINGS:  No fracture or dislocation of the left shoulder. Mild glenohumeral arthrosis.  MRI at [facility] showed the SLAP tear  PATIENT SURVEYS:  11/25/2023 FOTO 44 (risk-adjusted 34, Goal 58 in 19  visits)   02/24/24- switched to PSFS as cone system no longer using FOTO   Patient-Specific Activity Scoring Scheme  "0" represents "unable to perform." "10" represents "able to perform at prior level. 0 1 2 3 4 5 6 7 8 9  10 (Date and Score)   Activity 02/24/24  1. Fastening a bra   0  2. Pulling up pants   10  3. Functional reaching  5  4.Pushing/pulling  8  5.   Score 5.75   Total score = sum of the activity scores/number of activities Minimum detectable change (90%CI) for average score = 2 points Minimum detectable change (90%CI) for single activity score = 3 points    COGNITION: 11/25/2023 Overall cognitive status: Within functional limits for tasks assessed     SENSATION: 11/25/2023 [facility]  POSTURE: 11/25/2023 Mildly flexed  UPPER EXTREMITY ROM:     Left/Right 11/25/2023 PROM Left 11/30/2023 Supine PROM Left 12/14/2023 Left 12/23/2023 Left sitting 02/24/24 Right sitting 02/24/24  Shoulder flexion 85/170 120 110 140 118* pain 148*  Shoulder extension        Shoulder abduction  120   93* sharp pain 178*  Shoulder horizontal adduction 0/40  20 30    Shoulder internal rotation 30/80  50 70 at 70 degrees abduction Only able to get to side of hip, not able to IR enough to get behind her back  T7  Shoulder external rotation 15/95 40 in 30 deg abduction 50 75 at 70 degrees abduction Occiput  T4  Elbow flexion        Elbow extension        Wrist flexion        Wrist extension        Wrist ulnar deviation        Wrist radial deviation        Wrist pronation        Wrist supination        (Blank rows = not tested)  UPPER EXTREMITY STRENGTH: Deferred at evaluation secondary to  being less than 2 weeks post-surgery  MMT Left 02/24/24 Right 02/24/24  Shoulder flexion 3+ 5  Shoulder extension    Shoulder abduction 3 pain limited  5  Shoulder adduction    Shoulder internal rotation 3+ 4  Shoulder external rotation 3- 3+  Middle trapezius    Lower trapezius    Elbow flexion    Elbow  extension    Wrist flexion    Wrist extension    Wrist ulnar deviation    Wrist radial deviation    Wrist pronation    Wrist supination    Grip strength (lbs)    (Blank rows = not tested)                                                                                                             TREATMENT:         DATE:   02/24/24  Re-check MMT, ROM, PSFS, updated POC and re-certed, education on all   Supine shoulder flexion 1# AAROM 1x15 Supine chest presses 1# rod x15  Isometric shoulder flexion x15 Isometric shoulder ABD x15   Shoulder PROM mixed with moderate joint oscillations and grade I-III GH mobs as tolerated for flexion and scaption      12/23/2023 Shoulder blade pinches 5 x 5 seconds AAROM for IR and ER (70 degrees abduction) 20 x 10 seconds each direction Side-lie ER 0# 2 sets 10 x slow eccentrics Posterior capsule stretch 5 x 10 seconds  Functional Activities: Reaching:  Supine active-assisted arm raises 20 x 3 seconds, 1# Overhead function: Supine AAROM shoulder flexion 10 x 5 seconds (protract 1st, palm in) Reaching behind: Supine IR and ER stretch 20 x 10 seconds (reaching) Thumb up the back stretch (reach behind the back) 10 x 10 seconds  Ice pack left shoulder 5 minutes post-treatment   12/14/2023 Shoulder blade pinches 10 x 5 seconds Codmans forward/back; side to side; CW; CCW 30 x each PROM for IR and ER with particular care with ER (70 degrees scaption for PROM) 10 minutes  Functional Activities: Reaching:  Supine active-assisted arm raises 20 x 3 seconds Overhead function: Supine AAROM shoulder flexion 10 x 3 seconds (protract 1st, palm in) Reaching behind: Supine IR and ER stretch 20 x 10 seconds  Vaso left shoulder 10 minutes Medium Pressure 34*   12/01/2023 Shoulder blade pinches 10 x 5 seconds Codmans forward/back; side to side; CW; CCW 20 x each PROM for IR and ER with particular care with ER (70 degrees scaption for PROM) 20  minutes  Functional Activities: Reaching:  Supine active-assisted arm raises 20 x 3 seconds Overhead function: Supine AAROM shoulder flexion 10 x 2 seconds (protract 1st, palm in)  Vaso left shoulder 10 minutes Low Pressure 34*   PATIENT EDUCATION: Education details: See above Person educated: Patient Education method: Explanation, Demonstration, Tactile cues, Verbal cues, and Handouts Education comprehension: verbalized understanding, returned demonstration, verbal cues required, tactile cues required, and needs further education  HOME EXERCISE PROGRAM: Access Code: [email or web address] URL: [email or web address] Date: 12/23/2023 Prepared by:  Terral Ferrari  Exercises - Standing Scapular Retraction  - 5 x daily - 7 x weekly - 1 sets - 5 reps - 5 second hold - Pendulums  - 3-5 x daily - 1 x weekly - 1 sets - 30 reps - Supine Shoulder Flexion AAROM  - 2 x daily - 7 x weekly - 1 sets - 10 reps - 5 seconds hold - Supine Scapular Protraction in Flexion with Dumbbells  - 2 x daily - 7 x weekly - 1 sets - 20-30 reps - 3 seconds hold - Supine Shoulder External Rotation Stretch  - 2 x daily - 7 x weekly - 1 sets - 20 reps - 5 seconds hold - Supine Shoulder Internal Rotation Stretch  - 2 x daily - 7 x weekly - 1 sets - 20 reps - 5 seconds hold - Sidelying Shoulder External Rotation Dumbbell  - 1 x daily - 7 x weekly - 2 sets - 10 reps - 3 seconds hold - Standing Shoulder Posterior Capsule Stretch  - 1 x daily - 7 x weekly - 1 sets - 10 reps - 10 seconds hold - Standing Shoulder Internal Rotation Stretch with Hands Behind Back  - 1 x daily - 7 x weekly - 1 sets - 10 reps - 10 seconds hold  ASSESSMENT:  CLINICAL IMPRESSION:  Pt arrived today after not having been seen by PT for about 2 months. As such, updated all functional objective measures- she does not seem to have progressed significantly to the point that we need to update any goals. Unfortunately she is still having quite a bit of  pain and difficulty with functional tasks and has been unable to return to work. Will have our front desk clarify WC visits as well.  OBJECTIVE IMPAIRMENTS: decreased activity tolerance, decreased coordination, decreased endurance, decreased knowledge of condition, decreased ROM, decreased strength, decreased safety awareness, increased edema, impaired perceived functional ability, impaired UE functional use, and pain.   ACTIVITY LIMITATIONS: carrying, lifting, sleeping, dressing, and reach over head  PARTICIPATION LIMITATIONS: community activity and occupation  PERSONAL FACTORS: Asthma, bipolar, back pain, insomnia, depression, seizures, migraines, history of Rt shoulder surgery (clavicle excision) are also affecting patient's functional outcome.   REHAB POTENTIAL: Good  CLINICAL DECISION MAKING: Stable/uncomplicated  EVALUATION COMPLEXITY: Low   GOALS: Goals reviewed with patient? Yes  SHORT TERM GOALS: Target date: 01/06/2024 (did not extend on 02/24/24, all STGs already met)  Improve left shoulder active range of motion for flexion to 135 degrees; IR to 45 degrees; ER to 45 degrees and horizontal adduction to 20 degrees Baseline: 85; 30; 15; 0 respectively Goal status: Met 12/23/2023  2.  Hetal will be independent with her day 1 home exercise program Baseline: Started 11/25/2023 Goal status: Met 12/01/2023  3.  Lyrique will be out of her sling 100% of the time without increased left shoulder pain Baseline: In the sling 90+ percent of the time at evaluation Goal status: Met 12/23/2023   LONG TERM GOALS: Target date: 04/20/2024    PSFS to have improved by at least 2 points  Baseline: 44, risk adjusted 34 Goal status: GOAL UPDATED 02/24/24- cone no longer using FOTO   2.  Sitlaly will report left shoulder pain consistently 0-2/10 on the visual analog scale Baseline: 3-6/10 Goal status: ONGOING 02/24/24  3.  Improve left shoulder active range of motion to 90% or greater of: Flexion 170  degrees; external rotation 90 degrees; internal rotation 60 degrees and horizontal adduction 40 degrees  Baseline: See short-term goals Goal status: ONGOING 02/24/24  4.  Improve left shoulder strength to 70% or greater of the uninvolved right at 12 weeks post-surgery Baseline: Deferred secondary to being less than 2 weeks post-surgery Goal status: ONGOING 02/24/24  5.  Desteny will be independent with her long-term home maintenance exercise program at discharge Baseline: Started 11/25/2023 Goal status: ONGOING 02/24/24  6.  Ambulate when able to reach overhead and behind her back without increased pain or functional limitations at discharge Baseline: Unable at evaluation Goal status: ONGOING 02/24/24   PLAN:  PT FREQUENCY: 1-2X/week   PT DURATION: 8 weeks   PLANNED INTERVENTIONS: 97110-Therapeutic exercises, 97530- Therapeutic activity, 97112- Neuromuscular re-education, 97535- Self Care, [id number]- Manual therapy, [id number]- Vasopneumatic device, Patient/Family education, Joint mobilization, Cryotherapy, and Moist heat  PLAN FOR NEXT SESSION: any updates on worker's comp approval? She is now 14 weeks out from surgery, should be able to work as tolerated   Terrel Ferries, PT, DPT 02/24/24 9:31 AM

## 2024-02-29 ENCOUNTER — Ambulatory Visit (INDEPENDENT_AMBULATORY_CARE_PROVIDER_SITE_OTHER): Payer: Worker's Compensation | Admitting: Rehabilitative and Restorative Service Providers"

## 2024-02-29 ENCOUNTER — Other Ambulatory Visit: Payer: Self-pay | Admitting: Orthopaedic Surgery

## 2024-02-29 ENCOUNTER — Encounter: Payer: Self-pay | Admitting: Rehabilitative and Restorative Service Providers"

## 2024-02-29 DIAGNOSIS — G8929 Other chronic pain: Secondary | ICD-10-CM

## 2024-02-29 DIAGNOSIS — M25512 Pain in left shoulder: Secondary | ICD-10-CM | POA: Diagnosis not present

## 2024-02-29 DIAGNOSIS — M6281 Muscle weakness (generalized): Secondary | ICD-10-CM

## 2024-02-29 DIAGNOSIS — M25612 Stiffness of left shoulder, not elsewhere classified: Secondary | ICD-10-CM

## 2024-02-29 DIAGNOSIS — R293 Abnormal posture: Secondary | ICD-10-CM | POA: Diagnosis not present

## 2024-02-29 DIAGNOSIS — R600 Localized edema: Secondary | ICD-10-CM

## 2024-02-29 NOTE — Therapy (Signed)
 OUTPATIENT PHYSICAL THERAPY TREATMENT NOTE   Patient Name: Andrea Hood MRN: [id number] DOB:September 11, 1976, 48 y.o., female Today's Date: 02/29/2024  END OF SESSION:  PT End of Session - 02/29/24 0847     Visit Number 7    Number of Visits 24    Date for PT Re-Evaluation 04/20/24    Authorization Type Work Comp    PT Start Time 0846    PT Stop Time 0933    PT Time Calculation (min) 47 min    Activity Tolerance Patient tolerated treatment well;Patient limited by pain;No increased pain    Behavior During Therapy WFL for tasks assessed/performed            Past Medical History:  Diagnosis Date   Asthma    Bipolar 2 disorder (HCC)    BRCA negative 12/2019   MyRisk neg; IBIS=13.4%/riskscore=25.3%   Depression    Family history of breast cancer    Family history of ovarian cancer    Fatty liver    Gastric ulcer    Migraines    Pituitary cyst (HCC)    Seizures (HCC)    Past Surgical History:  Procedure Laterality Date   ABDOMINAL HYSTERECTOMY     CHOLECYSTECTOMY     Patient Active Problem List   Diagnosis Date Noted   Pain in left shoulder 08/02/2023   Backache 11/19/2010   INSOMNIA-SLEEP DISORDER-UNSPEC 08/27/2010   Anxiety state 03/24/2009   EATING DISORDER, UNSPECIFIED 03/24/2009   DEPRESSION 03/24/2009   Migraine headache 03/24/2009   GERD 03/24/2009   Gastric ulcer 03/24/2009   FATIGUE 03/24/2009    PCP: Donette Furlong, MD  REFERRING PROVIDER: Wes Hamman, MD  REFERRING DIAG: [phone] (ICD-10-CM) - Superior labrum anterior-to-posterior (SLAP) tear of left shoulder  THERAPY DIAG:  Abnormal posture  Muscle weakness (generalized)  Chronic left shoulder pain  Stiffness of left shoulder, not elsewhere classified  Localized edema  Rationale for Evaluation and Treatment: Rehabilitation  ONSET DATE: 11/17/2023 surgery was biceps tenodesis and arthroscopic debridement  SUBJECTIVE:                                                                                                                                                                                       SUBJECTIVE STATEMENT: [name] reports inconsistent HEP compliance.  I have not seen her in supervised PT in > 2 months.    PERTINENT HISTORY: Asthma, bipolar, back pain, insomnia, depression, seizures, migraines, history of Rt shoulder surgery (clavicle excision)  PAIN:  NPRS scale: 4-7/10 this week Pain location: Lt shoulder (deep) Pain description:  stabbing, deep pain, sharp; comes and goes  Aggravating factors: functional use, sleep Relieving factors:  not using it   PRECAUTIONS: Shoulder arthroscopy of SLAP tear with biceps tenodesis- about 15 weeks out from surgery as of 02/24/24   RED FLAGS: None   WEIGHT BEARING RESTRICTIONS: No  FALLS:  Has patient fallen in last 6 months? 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  NEXT MD VISIT: May 20th 2025  OBJECTIVE:  Note: Objective measures were completed at Evaluation unless otherwise noted.  DIAGNOSTIC FINDINGS:  No fracture or dislocation of the left shoulder. Mild glenohumeral arthrosis.  MRI at [facility] showed the SLAP tear  PATIENT SURVEYS:  11/25/2023 FOTO 44 (risk-adjusted 34, Goal 58 in 19 visits)   02/24/24- switched to PSFS as cone system no longer using FOTO   Patient-Specific Activity Scoring Scheme  "0" represents "unable to perform." "10" represents "able to perform at prior level. 0 1 2 3 4 5 6 7 8 9  10 (Date and Score)   Activity 02/24/24  1. Fastening a bra   0  2. Pulling up pants   10  3. Functional reaching  5  4.Pushing/pulling  8  5.   Score 5.75   Total score = sum of the activity  scores/number of activities Minimum detectable change (90%CI) for average score = 2 points Minimum detectable change (90%CI) for single activity score = 3 points    COGNITION: 11/25/2023 Overall cognitive status: Within functional limits for tasks assessed     SENSATION: 11/25/2023 [facility]  POSTURE: 11/25/2023 Mildly flexed  UPPER EXTREMITY ROM:     Left/Right 11/25/2023 PROM Left 11/30/2023 Supine PROM Left 12/14/2023 Left 12/23/2023 Left sitting 02/24/24 Right sitting 02/24/24 Left 02/29/2024  Shoulder flexion 85/170 120 110 140 118* pain 148* 125  Shoulder extension         Shoulder abduction  120   93* sharp pain 178*   Shoulder horizontal adduction 0/40  20 30   30   Shoulder internal rotation 30/80  50 70 at 70 degrees abduction Only able to get to side of hip, not able to IR enough to get behind her back  T7 45 degrees at 70 degrees abduction  Shoulder external rotation 15/95 40 in 30 deg abduction 50 75 at 70 degrees abduction Occiput  T4 70 degrees at 70 degrees abduction  Elbow flexion         Elbow extension         Wrist flexion         Wrist extension         Wrist ulnar deviation         Wrist radial deviation         Wrist pronation         Wrist supination         (Blank rows = not tested)  UPPER EXTREMITY STRENGTH: Deferred at evaluation secondary to being less than 2 weeks post-surgery  MMT Left 02/24/24 Right 02/24/24 Left/Right 02/29/2024  Shoulder flexion 3+ 5   Shoulder extension     Shoulder abduction 3 pain limited  5   Shoulder adduction     Shoulder internal rotation 3+ 4 13.4/20.7 pounds  Shoulder external rotation 3- 3+ 9.3/18.3 pounds  Middle trapezius     Lower trapezius  Elbow flexion     Elbow extension     Wrist flexion     Wrist extension     Wrist ulnar deviation     Wrist radial deviation     Wrist pronation     Wrist supination     Grip strength (lbs)     (Blank rows = not tested)                                                                                                              TREATMENT:         DATE:  02/29/2024 Functional Activities: Reaching- Scapular protraction 20 x 3 seconds 2# Overhead function- Supine shoulder flexion 10 x 5 seconds (palms facing in and protract 1st) Reaching behind- Thumb up the back (IR stretch) 10 x 10 seconds  There Ex: Supine ER/IR stretch 20 x 10 seconds in each direction Thera-Band ER/IR Red 10 x 3 seconds with slow eccentrics  97535: Reviewed updated HEP, importance of CONSISTENT HEP compliance and expected objective outcomes   02/24/24 Re-check MMT, ROM, PSFS, updated POC and re-certed, education on all   Supine shoulder flexion 1# AAROM 1x15 Supine chest presses 1# rod x15  Isometric shoulder flexion x15 Isometric shoulder ABD x15   Shoulder PROM mixed with moderate joint oscillations and grade I-III GH mobs as tolerated for flexion and scaption   12/23/2023 Shoulder blade pinches 5 x 5 seconds AAROM for IR and ER (70 degrees abduction) 20 x 10 seconds each direction Side-lie ER 0# 2 sets 10 x slow eccentrics Posterior capsule stretch 5 x 10 seconds  Functional Activities: Reaching:  Supine active-assisted arm raises 20 x 3 seconds, 1# Overhead function: Supine AAROM shoulder flexion 10 x 5 seconds (protract 1st, palm in) Reaching behind: Supine IR and ER stretch 20 x 10 seconds (reaching) Thumb up the back stretch (reach behind the back) 10 x 10 seconds  Ice pack left shoulder 5 minutes post-treatment   PATIENT EDUCATION: Education details: See above Person educated: Patient Education method: Explanation, Demonstration, Tactile cues, Verbal cues, and Handouts Education comprehension: verbalized understanding, returned demonstration, verbal cues required, tactile cues required, and needs further education  HOME EXERCISE PROGRAM: Access Code: [email or web address] URL: [email or web address] Date: 02/29/2024 Prepared by: Terral Ferrari  Exercises - Standing  Scapular Retraction  - 5 x daily - 1 x weekly - 1 sets - 5 reps - 5 second hold - Supine Shoulder Flexion AAROM  - 3 x daily - 7 x weekly - 1 sets - 10 reps - 5 seconds hold - Supine Scapular Protraction in Flexion with Dumbbells  - 2-3 x daily - 7 x weekly - 1 sets - 20-30 reps - 3 seconds hold - Supine Shoulder External Rotation Stretch  - 2 x daily - 7 x weekly - 1 sets - 10 reps - 10 seconds hold - Supine Shoulder Internal Rotation Stretch  - 3-5 x daily - 7 x weekly - 1 sets - 10-20 reps - 5 seconds hold - Sidelying Shoulder External Rotation Dumbbell  -  1 x daily - 1 x weekly - 2 sets - 10 reps - 3 seconds hold - Standing Shoulder Posterior Capsule Stretch  - 1 x daily - 1 x weekly - 1 sets - 10 reps - 10 seconds hold - Standing Shoulder Internal Rotation Stretch with Hands Behind Back  - 3 x daily - 7 x weekly - 1 sets - 10 reps - 10 seconds hold - Shoulder External Rotation with Anchored Resistance  - 1 x daily - 7 x weekly - 1 sets - 10 reps - 3 hold - Shoulder Internal Rotation with Resistance  - 1 x daily - 7 x weekly - 1 sets - 10 reps - 3 hold  ASSESSMENT:  CLINICAL IMPRESSION:  Alonie will be 15 weeks post biceps tenodesis and arthroscopic debridement tomorrow.  Objective numbers today are not as good as they were 2 months ago (the last time I saw Jasdeep).  However, we reviewed appropriate activities and she should meet long-term goals with consistent HEP compliance and the recommended course of supervised PT.  A 2 month gap in her PT did not facilitate progress.  OBJECTIVE IMPAIRMENTS: decreased activity tolerance, decreased coordination, decreased endurance, decreased knowledge of condition, decreased ROM, decreased strength, decreased safety awareness, increased edema, impaired perceived functional ability, impaired UE functional use, and pain.   ACTIVITY LIMITATIONS: carrying, lifting, sleeping, dressing, and reach over head  PARTICIPATION LIMITATIONS: community activity and  occupation  PERSONAL FACTORS: Asthma, bipolar, back pain, insomnia, depression, seizures, migraines, history of Rt shoulder surgery (clavicle excision) are also affecting patient's functional outcome.   REHAB POTENTIAL: Good  CLINICAL DECISION MAKING: Stable/uncomplicated  EVALUATION COMPLEXITY: Low   GOALS: Goals reviewed with patient? Yes  SHORT TERM GOALS: Target date: 01/06/2024 (did not extend on 02/24/24, all STGs already met)  Improve left shoulder active range of motion for flexion to 135 degrees; IR to 45 degrees; ER to 45 degrees and horizontal adduction to 20 degrees Baseline: 85; 30; 15; 0 respectively Goal status: Met 12/23/2023  2.  Adlene will be independent with her day 1 home exercise program Baseline: Started 11/25/2023 Goal status: Met 12/01/2023  3.  Rhya will be out of her sling 100% of the time without increased left shoulder pain Baseline: In the sling 90+ percent of the time at evaluation Goal status: Met 12/23/2023   LONG TERM GOALS: Target date: 04/20/2024    PSFS to have improved by at least 2 points  Baseline: 44, risk adjusted 34 Goal status: GOAL UPDATED 02/24/24- cone no longer using FOTO   2.  Ajooni will report left shoulder pain consistently 0-2/10 on the visual analog scale Baseline: 3-6/10 Goal status: ONGOING 02/29/24  3.  Improve left shoulder active range of motion to 90% or greater of: Flexion 170 degrees; external rotation 90 degrees; internal rotation 60 degrees and horizontal adduction 40 degrees Baseline: See short-term goals Goal status: ONGOING 02/29/24  4.  Improve left shoulder strength to 70% or greater of the uninvolved right at 12 weeks post-surgery Baseline: Deferred secondary to being less than 2 weeks post-surgery Goal status: ONGOING 02/29/24  5.  Romie will be independent with her long-term home maintenance exercise program at discharge Baseline: Started 11/25/2023 Goal status: ONGOING 02/29/24  6.  Ambulate when able to reach  overhead and behind her back without increased pain or functional limitations at discharge Baseline: Unable at evaluation Goal status: ONGOING 02/29/24   PLAN:  PT FREQUENCY: 1-2X/week   PT DURATION: 8 weeks   PLANNED  INTERVENTIONS: 97110-Therapeutic exercises, 97530- Therapeutic activity, [id number]- Neuromuscular re-education, 97535- Self Care, 16606- Manual therapy, 97016- Vasopneumatic device, Patient/Family education, Joint mobilization, Cryotherapy, and Moist heat  PLAN FOR NEXT SESSION: Any updates on worker's comp approval? She is now 15 weeks out from surgery, should be able to participate in supervised PT without too many restrictions.   Joli Neas PT, MPT 02/29/24 5:20 PM

## 2024-03-05 NOTE — Progress Notes (Deleted)
   Post-Op Visit Note   Patient: Andrea Hood           Date of Birth: January 30, 1976           MRN: [id number] Visit Date: 03/06/2024 PCP: Donette Furlong, MD   Assessment & Plan:  Chief Complaint: No chief complaint on file.  Visit Diagnoses:  1. Chronic left shoulder pain     Plan: ***  Follow-Up Instructions: No follow-ups on file.   Orders:  No orders of the defined types were placed in this encounter.  No orders of the defined types were placed in this encounter.   Imaging: No results found.  PMFS History: Patient Active Problem List   Diagnosis Date Noted   Pain in left shoulder 08/02/2023   Backache 11/19/2010   INSOMNIA-SLEEP DISORDER-UNSPEC 08/27/2010   Anxiety state 03/24/2009   EATING DISORDER, UNSPECIFIED 03/24/2009   DEPRESSION 03/24/2009   Migraine headache 03/24/2009   GERD 03/24/2009   Gastric ulcer 03/24/2009   FATIGUE 03/24/2009   Past Medical History:  Diagnosis Date   Asthma    Bipolar 2 disorder (HCC)    BRCA negative 12/2019   MyRisk neg; IBIS=13.4%/riskscore=25.3%   Depression    Family history of breast cancer    Family history of ovarian cancer    Fatty liver    Gastric ulcer    Migraines    Pituitary cyst (HCC)    Seizures (HCC)     Family History  Problem Relation Age of Onset   Cervical cancer Mother 55   Ovarian cancer Mother 3   Breast cancer Maternal Grandmother 52   Breast cancer Maternal Aunt 35    Past Surgical History:  Procedure Laterality Date   ABDOMINAL HYSTERECTOMY     CHOLECYSTECTOMY     Social History   Occupational History   Not on file  Tobacco Use   Smoking status: Former    Current packs/day: 0.50    Types: Cigarettes   Smokeless tobacco: Never  Vaping Use   Vaping status: Never Used  Substance and Sexual Activity   Alcohol use: Not Currently   Drug use: No   Sexual activity: Yes    Birth control/protection: Surgical

## 2024-03-06 ENCOUNTER — Ambulatory Visit (INDEPENDENT_AMBULATORY_CARE_PROVIDER_SITE_OTHER): Payer: Self-pay | Admitting: Orthopaedic Surgery

## 2024-03-06 DIAGNOSIS — M25512 Pain in left shoulder: Secondary | ICD-10-CM

## 2024-03-06 DIAGNOSIS — G8929 Other chronic pain: Secondary | ICD-10-CM

## 2024-03-07 ENCOUNTER — Encounter: Payer: Worker's Compensation | Admitting: Rehabilitative and Restorative Service Providers"

## 2024-03-07 NOTE — Progress Notes (Signed)
 cancelled

## 2024-03-09 ENCOUNTER — Encounter: Admitting: Physical Therapy

## 2024-03-13 ENCOUNTER — Ambulatory Visit (INDEPENDENT_AMBULATORY_CARE_PROVIDER_SITE_OTHER): Payer: Worker's Compensation | Admitting: Orthopaedic Surgery

## 2024-03-13 DIAGNOSIS — Z9889 Other specified postprocedural states: Secondary | ICD-10-CM

## 2024-03-13 DIAGNOSIS — S43432A Superior glenoid labrum lesion of left shoulder, initial encounter: Secondary | ICD-10-CM

## 2024-03-13 NOTE — Progress Notes (Signed)
 Office Visit Note   Patient: Andrea Hood           Date of Birth: 02/25/1976           MRN: [id number] Visit Date: 03/13/2024              Requested by: Donette Furlong, MD [address] [facility] [place],  [place] [address] PCP: Donette Furlong, MD   Assessment & Plan: Visit Diagnoses:  1. S/P arthroscopy of left shoulder   2. Superior labrum anterior-to-posterior (SLAP) tear of left shoulder     Plan: History of Present Illness Andrea Hood is a 48 year old female who presents with ongoing shoulder pain and limited range of motion following surgery. She is accompanied by Edwina Gram, case worker.  She experiences persistent shoulder pain and limited range of motion since her surgery at the end of January. Despite attending two physical therapy sessions since her last visit, there is no significant improvement. She struggles to lift her arm above a certain height and cannot lift objects heavier than five pounds. Pain occasionally radiates down her arm, particularly while driving. She is currently not working and is unable to perform tasks that involve lifting or moving people. She attends physical therapy twice a week and has three more weeks left in her current plan.  Physical Exam MUSCULOSKELETAL: Shoulder abduction to 140 degrees, external rotation to 60 degrees on one side; shoulder abduction to 90 degrees on the other side.  All with guarding.  Assessment and Plan S/p left shoulder scope, biceps tenodesis for SLAP tear Persistent pain and weakness post-surgery, likely due to muscle weakness. Insufficient physical therapy. MMI expected six to nine months post-surgery. - Order six weeks of physical therapy, twice weekly. - Keep out of work for six weeks. - Schedule follow-up in six weeks to reassess and determine need for work conditioning.  Follow-Up Instructions: No follow-ups on file.   Orders:  Orders Placed This Encounter  Procedures   Ambulatory  referral to Physical Therapy   No orders of the defined types were placed in this encounter.     Procedures: No procedures performed   Clinical Data: No additional findings.   Subjective: Chief Complaint  Patient presents with   Left Shoulder - Pain    HPI  Review of Systems  Constitutional: Negative.   HENT: Negative.    Eyes: Negative.   Respiratory: Negative.    Cardiovascular: Negative.   Endocrine: Negative.   Musculoskeletal: Negative.   Neurological: Negative.   Hematological: Negative.   Psychiatric/Behavioral: Negative.    All other systems reviewed and are negative.    Objective: Vital Signs: There were no vitals taken for this visit.  Physical Exam Vitals and nursing note reviewed.  Constitutional:      Appearance: She is well-developed.  HENT:     Head: Normocephalic and atraumatic.  Pulmonary:     Effort: Pulmonary effort is normal.  Abdominal:     Palpations: Abdomen is soft.  Musculoskeletal:     Cervical back: Neck supple.  Skin:    General: Skin is warm.     Capillary Refill: Capillary refill takes less than 2 seconds.  Neurological:     Mental Status: She is alert and oriented to person, place, and time.  Psychiatric:        Behavior: Behavior normal.        Thought Content: Thought content normal.        Judgment:  Judgment normal.     Ortho Exam  Specialty Comments:  No specialty comments available.  Imaging: No results found.   PMFS History: Patient Active Problem List   Diagnosis Date Noted   Pain in left shoulder 08/02/2023   Backache 11/19/2010   INSOMNIA-SLEEP DISORDER-UNSPEC 08/27/2010   Anxiety state 03/24/2009   EATING DISORDER, UNSPECIFIED 03/24/2009   DEPRESSION 03/24/2009   Migraine headache 03/24/2009   GERD 03/24/2009   Gastric ulcer 03/24/2009   FATIGUE 03/24/2009   Past Medical History:  Diagnosis Date   Asthma    Bipolar 2 disorder (HCC)    BRCA negative 12/2019   MyRisk neg;  IBIS=13.4%/riskscore=25.3%   Depression    Family history of breast cancer    Family history of ovarian cancer    Fatty liver    Gastric ulcer    Migraines    Pituitary cyst (HCC)    Seizures (HCC)     Family History  Problem Relation Age of Onset   Cervical cancer Mother 49   Ovarian cancer Mother 36   Breast cancer Maternal Grandmother 51   Breast cancer Maternal Aunt 35    Past Surgical History:  Procedure Laterality Date   ABDOMINAL HYSTERECTOMY     CHOLECYSTECTOMY     Social History   Occupational History   Not on file  Tobacco Use   Smoking status: Former    Current packs/day: 0.50    Types: Cigarettes   Smokeless tobacco: Never  Vaping Use   Vaping status: Never Used  Substance and Sexual Activity   Alcohol use: Not Currently   Drug use: No   Sexual activity: Yes    Birth control/protection: Surgical

## 2024-03-16 ENCOUNTER — Encounter: Payer: Worker's Compensation | Admitting: Physical Therapy

## 2024-03-19 ENCOUNTER — Ambulatory Visit (INDEPENDENT_AMBULATORY_CARE_PROVIDER_SITE_OTHER): Payer: Worker's Compensation | Admitting: Physical Therapy

## 2024-03-19 ENCOUNTER — Encounter: Payer: Self-pay | Admitting: Physical Therapy

## 2024-03-19 DIAGNOSIS — R293 Abnormal posture: Secondary | ICD-10-CM

## 2024-03-19 DIAGNOSIS — M6281 Muscle weakness (generalized): Secondary | ICD-10-CM | POA: Diagnosis not present

## 2024-03-19 DIAGNOSIS — R600 Localized edema: Secondary | ICD-10-CM

## 2024-03-19 DIAGNOSIS — G8929 Other chronic pain: Secondary | ICD-10-CM

## 2024-03-19 DIAGNOSIS — M25612 Stiffness of left shoulder, not elsewhere classified: Secondary | ICD-10-CM

## 2024-03-19 DIAGNOSIS — M25512 Pain in left shoulder: Secondary | ICD-10-CM

## 2024-03-19 DIAGNOSIS — M542 Cervicalgia: Secondary | ICD-10-CM

## 2024-03-19 NOTE — Therapy (Signed)
 OUTPATIENT PHYSICAL THERAPY TREATMENT NOTE   Patient Name: Andrea Hood MRN: [id number] DOB:August 20, 1976, 48 y.o., female Today's Date: 03/19/2024  END OF SESSION:  PT End of Session - 03/19/24 0929     Visit Number 8    Number of Visits 24    Date for PT Re-Evaluation 04/20/24    Authorization Type Work Comp    Authorization Time Period Per Edwina Gram from [place] on eval    PT Start Time 0848    PT Stop Time [phone]    PT Time Calculation (min) 40 min    Activity Tolerance Patient tolerated treatment well;Patient limited by pain;No increased pain    Behavior During Therapy WFL for tasks assessed/performed             Past Medical History:  Diagnosis Date   Asthma    Bipolar 2 disorder (HCC)    BRCA negative 12/2019   MyRisk neg; IBIS=13.4%/riskscore=25.3%   Depression    Family history of breast cancer    Family history of ovarian cancer    Fatty liver    Gastric ulcer    Migraines    Pituitary cyst (HCC)    Seizures (HCC)    Past Surgical History:  Procedure Laterality Date   ABDOMINAL HYSTERECTOMY     CHOLECYSTECTOMY     Patient Active Problem List   Diagnosis Date Noted   Pain in left shoulder 08/02/2023   Backache 11/19/2010   INSOMNIA-SLEEP DISORDER-UNSPEC 08/27/2010   Anxiety state 03/24/2009   EATING DISORDER, UNSPECIFIED 03/24/2009   DEPRESSION 03/24/2009   Migraine headache 03/24/2009   GERD 03/24/2009   Gastric ulcer 03/24/2009   FATIGUE 03/24/2009    PCP: Donette Furlong, MD  REFERRING PROVIDER: Wes Hamman, MD  REFERRING DIAG: [phone] (ICD-10-CM) - Superior labrum anterior-to-posterior (SLAP) tear of left shoulder  THERAPY DIAG:  Abnormal posture  Muscle weakness (generalized)  Chronic left shoulder pain  Localized edema  Stiffness of left shoulder, not elsewhere classified  Cervicalgia  Acute pain of left shoulder  Rationale for Evaluation and Treatment: Rehabilitation  ONSET DATE: 11/17/2023 surgery was biceps tenodesis and  arthroscopic debridement  SUBJECTIVE:                                                                                                                                                                                      SUBJECTIVE STATEMENT: Pt reporting 6/10 pain in Left shoulder.  PERTINENT HISTORY: Asthma, bipolar, back pain, insomnia, depression, seizures, migraines, history of Rt shoulder surgery (clavicle excision)  PAIN:  NPRS scale: 6/10 this week Pain location: Lt shoulder (deep) Pain description:  stabbing, deep pain, sharp; comes and goes  Aggravating factors: functional use, sleep Relieving factors:  not using it   PRECAUTIONS: Shoulder arthroscopy of SLAP tear with biceps tenodesis- about 15 weeks out from surgery as of 02/24/24   RED FLAGS: None   WEIGHT BEARING RESTRICTIONS: No  FALLS:  Has patient fallen in last 6 months? 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  NEXT MD VISIT: May 20th 2025  OBJECTIVE:  Note: Objective measures were completed at Evaluation unless otherwise noted.  DIAGNOSTIC FINDINGS:  No fracture or dislocation of the left shoulder. Mild glenohumeral arthrosis.  [facility] showed the SLAP tear  PATIENT SURVEYS:  11/25/2023 FOTO 44 (risk-adjusted 34, Goal 58 in 19 visits)   02/24/24- switched to PSFS as cone system no longer using FOTO   Patient-Specific Activity Scoring Scheme  "0" represents "unable to perform." "10" represents "able to perform at prior level. 0 1 2 3 4 5 6 7 8 9  10 (Date and Score)   Activity 02/24/24 03/19/24  1. Fastening a bra   0 0  2. Pulling up pants   10 7  3. Functional reaching  5 8  4.Pushing/pulling  8 0   5.    Score 5.75 3.75   Total score = sum of the activity scores/number of activities Minimum detectable change (90%CI) for average score = 2 points Minimum detectable change (90%CI) for single activity score = 3 points    COGNITION: 11/25/2023 Overall cognitive status: Within functional limits for tasks assessed     SENSATION: 11/25/2023 [facility]  POSTURE: 11/25/2023 Mildly flexed  UPPER EXTREMITY ROM:     Left/Right 11/25/2023 PROM Left 11/30/2023 Supine PROM Left 12/14/2023 Left 12/23/2023 Left sitting 02/24/24 Right sitting 02/24/24 Left 02/29/2024  Shoulder flexion 85/170 120 110 140 118* pain 148* 125  Shoulder extension         Shoulder abduction  120   93* sharp pain 178*   Shoulder horizontal adduction 0/40  20 30   30   Shoulder internal rotation 30/80  50 70 at 70 degrees abduction Only able to get to side of hip, not able to IR enough to get behind her back  T7 45 degrees at 70 degrees abduction  Shoulder external rotation 15/95 40 in 30 deg abduction 50 75 at 70 degrees abduction Occiput  T4 70 degrees at 70 degrees abduction  Elbow flexion         Elbow extension         Wrist flexion         Wrist extension         Wrist ulnar deviation         Wrist radial deviation         Wrist pronation         Wrist supination         (Blank rows = not tested)  UPPER EXTREMITY STRENGTH: Deferred at evaluation secondary to being less than 2 weeks post-surgery  MMT Left 02/24/24 Right 02/24/24 Left/Right 02/29/2024  Shoulder flexion 3+ 5   Shoulder extension     Shoulder abduction 3 pain limited  5   Shoulder adduction     Shoulder internal rotation 3+ 4 13.4/20.7 pounds  Shoulder external rotation 3- 3+  9.3/18.3 pounds  Middle trapezius     Lower trapezius     Elbow flexion     Elbow extension     Wrist flexion     Wrist extension     Wrist ulnar deviation     Wrist radial deviation     Wrist pronation     Wrist supination     Grip strength (lbs)     (Blank rows = not  tested)                                                                                                             TREATMENT:         DATE:  03/19/24:  Manual: Percussion to pt's left shoulder, focusing on infraspinatus, upper trap, and supraspinatus Self Care: Pt edu in self soft tissue mobilizations using tennis ball TherAct Rows: green TB 2 x 10 holding 3 sec, scapular squeeze, slow eccentrics Shoulder extension: red TB 2 x 10  scapular squeeze, slow eccentrics Reaching to first gym shelf c small cone x 5, c 1#  x 5 Reaching to 2nd gym shelf c small cone x 5, c 1# x 5  TherEx: UBE: level 1 x 2 minutes each direction with 1 minute break in between Standing money bil ER 2 x 10 holding 3 sec Seated upper trap stretch: x 4 bil holding 10 sec Seated levator stretch x 4 bil  holding 10 sec  Seated scalenes stretch x 3 bil holding 10 sec       02/29/2024 Functional Activities: Reaching- Scapular protraction 20 x 3 seconds 2# Overhead function- Supine shoulder flexion 10 x 5 seconds (palms facing in and protract 1st) Reaching behind- Thumb up the back (IR stretch) 10 x 10 seconds  There Ex: Supine ER/IR stretch 20 x 10 seconds in each direction Thera-Band ER/IR Red 10 x 3 seconds with slow eccentrics  97535: Reviewed updated HEP, importance of CONSISTENT HEP compliance and expected objective outcomes   02/24/24 Re-check MMT, ROM, PSFS, updated POC and re-certed, education on all   Supine shoulder flexion 1# AAROM 1x15 Supine chest presses 1# rod x15  Isometric shoulder flexion x15 Isometric shoulder ABD x15   Shoulder PROM mixed with moderate joint oscillations and grade I-III GH mobs as tolerated for flexion and scaption   12/23/2023 Shoulder blade pinches 5 x 5 seconds AAROM for IR and ER (70 degrees abduction) 20 x 10 seconds each direction Side-lie ER 0# 2 sets 10 x slow eccentrics Posterior capsule stretch 5 x 10 seconds  Functional Activities: Reaching:  Supine  active-assisted arm raises 20 x 3 seconds, 1# Overhead function: Supine AAROM shoulder flexion 10 x 5 seconds (protract 1st, palm in) Reaching behind: Supine IR and ER stretch 20 x 10 seconds (reaching) Thumb up the back stretch (reach behind the back) 10 x 10 seconds  Ice pack left shoulder 5 minutes post-treatment   PATIENT EDUCATION: Education details: See above Person educated: Patient Education method: Explanation, Demonstration, Tactile cues, Verbal cues, and Handouts Education comprehension: verbalized understanding,  returned demonstration, verbal cues required, tactile cues required, and needs further education  HOME EXERCISE PROGRAM: Access Code: [email or web address] URL: [email or web address] Date: 02/29/2024 Prepared by: Terral Ferrari  Exercises - Standing Scapular Retraction  - 5 x daily - 1 x weekly - 1 sets - 5 reps - 5 second hold - Supine Shoulder Flexion AAROM  - 3 x daily - 7 x weekly - 1 sets - 10 reps - 5 seconds hold - Supine Scapular Protraction in Flexion with Dumbbells  - 2-3 x daily - 7 x weekly - 1 sets - 20-30 reps - 3 seconds hold - Supine Shoulder External Rotation Stretch  - 2 x daily - 7 x weekly - 1 sets - 10 reps - 10 seconds hold - Supine Shoulder Internal Rotation Stretch  - 3-5 x daily - 7 x weekly - 1 sets - 10-20 reps - 5 seconds hold - Sidelying Shoulder External Rotation Dumbbell  - 1 x daily - 1 x weekly - 2 sets - 10 reps - 3 seconds hold - Standing Shoulder Posterior Capsule Stretch  - 1 x daily - 1 x weekly - 1 sets - 10 reps - 10 seconds hold - Standing Shoulder Internal Rotation Stretch with Hands Behind Back  - 3 x daily - 7 x weekly - 1 sets - 10 reps - 10 seconds hold - Shoulder External Rotation with Anchored Resistance  - 1 x daily - 7 x weekly - 1 sets - 10 reps - 3 hold - Shoulder Internal Rotation with Resistance  - 1 x daily - 7 x weekly - 1 sets - 10 reps - 3 hold  ASSESSMENT:  CLINICAL IMPRESSION: Pt arriving today with  6/10 pain in her left shoulder. Pt instructed in self soft tissue mobs and cervical stretching to help with tightness noted in her cervical and shoulder musculature. Pt tolerating treatment with pain with resistive and reaching activities. Recommending continued skilled PT interventions toward goals set.   OBJECTIVE IMPAIRMENTS: decreased activity tolerance, decreased coordination, decreased endurance, decreased knowledge of condition, decreased ROM, decreased strength, decreased safety awareness, increased edema, impaired perceived functional ability, impaired UE functional use, and pain.   ACTIVITY LIMITATIONS: carrying, lifting, sleeping, dressing, and reach over head  PARTICIPATION LIMITATIONS: community activity and occupation  PERSONAL FACTORS: Asthma, bipolar, back pain, insomnia, depression, seizures, migraines, history of Rt shoulder surgery (clavicle excision) are also affecting patient's functional outcome.   REHAB POTENTIAL: Good  CLINICAL DECISION MAKING: Stable/uncomplicated  EVALUATION COMPLEXITY: Low   GOALS: Goals reviewed with patient? Yes  SHORT TERM GOALS: Target date: 01/06/2024 (did not extend on 02/24/24, all STGs already met)  Improve left shoulder active range of motion for flexion to 135 degrees; IR to 45 degrees; ER to 45 degrees and horizontal adduction to 20 degrees Baseline: 85; 30; 15; 0 respectively Goal status: Met 12/23/2023  2.  Mayreli will be independent with her day 1 home exercise program Baseline: Started 11/25/2023 Goal status: Met 12/01/2023  3.  Elaiza will be out of her sling 100% of the time without increased left shoulder pain Baseline: In the sling 90+ percent of the time at evaluation Goal status: Met 12/23/2023   LONG TERM GOALS: Target date: 04/20/2024    PSFS to have improved by at least 2 points  Baseline: 44, risk adjusted 34 Goal status: GOAL UPDATED 02/24/24- cone no longer using FOTO   2.  Sumiya will report left shoulder pain  consistently 0-2/10 on the visual analog scale Baseline: 3-6/10 Goal  status: ONGOING 02/29/24  3.  Improve left shoulder active range of motion to 90% or greater of: Flexion 170 degrees; external rotation 90 degrees; internal rotation 60 degrees and horizontal adduction 40 degrees Baseline: See short-term goals Goal status: ONGOING 02/29/24  4.  Improve left shoulder strength to 70% or greater of the uninvolved right at 12 weeks post-surgery Baseline: Deferred secondary to being less than 2 weeks post-surgery Goal status: ONGOING 02/29/24  5.  Blakeley will be independent with her long-term home maintenance exercise program at discharge Baseline: Started 11/25/2023 Goal status: ONGOING 02/29/24  6.  Ambulate when able to reach overhead and behind her back without increased pain or functional limitations at discharge Baseline: Unable at evaluation Goal status: ONGOING 02/29/24   PLAN:  PT FREQUENCY: 1-2X/week   PT DURATION: 8 weeks   PLANNED INTERVENTIONS: 97110-Therapeutic exercises, 97530- Therapeutic activity, 97112- Neuromuscular re-education, 97535- Self Care, 21308- Manual therapy, 97016- Vasopneumatic device, Patient/Family education, Joint mobilization, Cryotherapy, and Moist heat  PLAN FOR NEXT SESSION: She is now 15 weeks out from surgery, should be able to participate in supervised PT without too many restrictions.   Jerrel Mor, PT , MPT 03/19/24 9:32 AM   03/19/24 9:32 AM

## 2024-03-22 ENCOUNTER — Encounter: Admitting: Rehabilitative and Restorative Service Providers"

## 2024-04-02 ENCOUNTER — Encounter: Admitting: Physical Therapy

## 2024-04-02 NOTE — Therapy (Incomplete)
 OUTPATIENT PHYSICAL THERAPY TREATMENT NOTE   Patient Name: Andrea Hood MRN: [id number] DOB:1975/11/15, 48 y.o., female Today's Date: 04/02/2024  END OF SESSION:    Past Medical History:  Diagnosis Date   Asthma    Bipolar 2 disorder (HCC)    BRCA negative 12/2019   MyRisk neg; IBIS=13.4%/riskscore=25.3%   Depression    Family history of breast cancer    Family history of ovarian cancer    Fatty liver    Gastric ulcer    Migraines    Pituitary cyst (HCC)    Seizures (HCC)    Past Surgical History:  Procedure Laterality Date   ABDOMINAL HYSTERECTOMY     CHOLECYSTECTOMY     Patient Active Problem List   Diagnosis Date Noted   Pain in left shoulder 08/02/2023   Backache 11/19/2010   INSOMNIA-SLEEP DISORDER-UNSPEC 08/27/2010   Anxiety state 03/24/2009   EATING DISORDER, UNSPECIFIED 03/24/2009   DEPRESSION 03/24/2009   Migraine headache 03/24/2009   GERD 03/24/2009   Gastric ulcer 03/24/2009   FATIGUE 03/24/2009    PCP: Donette Furlong, MD  REFERRING PROVIDER: Wes Hamman, MD  REFERRING DIAG: [phone] (ICD-10-CM) - Superior labrum anterior-to-posterior (SLAP) tear of left shoulder  THERAPY DIAG:  No diagnosis found.  Rationale for Evaluation and Treatment: Rehabilitation  ONSET DATE: 11/17/2023 surgery was biceps tenodesis and arthroscopic debridement  SUBJECTIVE:                                                                                                                                                                                      SUBJECTIVE STATEMENT: *** Pt reporting 6/10 pain in Left shoulder.  PERTINENT HISTORY: Asthma, bipolar, back pain, insomnia, depression, seizures, migraines, history of Rt shoulder surgery (clavicle excision)  PAIN:  NPRS scale: 6/10 this week Pain location: Lt shoulder (deep) Pain description:  stabbing, deep pain, sharp; comes and goes  Aggravating factors: functional use, sleep Relieving factors:  not using it    PRECAUTIONS: Shoulder arthroscopy of SLAP tear with biceps tenodesis- about 15 weeks out from surgery as of 02/24/24   RED FLAGS: None   WEIGHT BEARING RESTRICTIONS: No  FALLS:  Has patient fallen in last 6 months? 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  NEXT MD VISIT: May 20th 2025  OBJECTIVE:  Note: Objective measures were completed at Evaluation unless otherwise noted.  DIAGNOSTIC FINDINGS:  No fracture or dislocation of the left shoulder. Mild glenohumeral arthrosis.  [facility] at [facility] showed the SLAP tear  PATIENT SURVEYS:  11/25/2023 FOTO 44 (risk-adjusted 34, Goal 58 in 19 visits)   02/24/24- switched to PSFS as cone system no longer using FOTO   Patient-Specific Activity Scoring Scheme  0 represents "unable to perform." 10 represents "able to perform at prior level. 0 1 2 3 4 5 6 7 8 9  10 (Date and Score)   Activity 02/24/24 03/19/24  1. Fastening a bra   0 0  2. Pulling up pants   10 7  3. Functional reaching  5 8  4.Pushing/pulling  8 0  5.    Score 5.75 3.75   Total score = sum of the activity scores/number of activities Minimum detectable change (90%CI) for average score = 2 points Minimum detectable change (90%CI) for single activity score = 3 points    COGNITION: 11/25/2023 Overall cognitive status: Within functional limits for tasks assessed     SENSATION: 11/25/2023 [facility]  POSTURE: 11/25/2023 Mildly flexed  UPPER EXTREMITY ROM:     Left/Right 11/25/2023 PROM Left 11/30/2023 Supine PROM Left 12/14/2023 Left 12/23/2023 Left sitting 02/24/24 Right sitting 02/24/24 Left 02/29/2024  Shoulder flexion 85/170 120 110 140 118* pain 148* 125  Shoulder extension          Shoulder abduction  120   93* sharp pain 178*   Shoulder horizontal adduction 0/40  20 30   30   Shoulder internal rotation 30/80  50 70 at 70 degrees abduction Only able to get to side of hip, not able to IR enough to get behind her back  T7 45 degrees at 70 degrees abduction  Shoulder external rotation 15/95 40 in 30 deg abduction 50 75 at 70 degrees abduction Occiput  T4 70 degrees at 70 degrees abduction  Elbow flexion         Elbow extension         Wrist flexion         Wrist extension         Wrist ulnar deviation         Wrist radial deviation         Wrist pronation         Wrist supination         (Blank rows = not tested)  UPPER EXTREMITY STRENGTH: Deferred at evaluation secondary to being less than 2 weeks post-surgery  MMT Left 02/24/24 Right 02/24/24 Left/Right 02/29/2024  Shoulder flexion 3+ 5   Shoulder extension     Shoulder abduction 3 pain limited  5   Shoulder adduction     Shoulder internal rotation 3+ 4 13.4/20.7 pounds  Shoulder external rotation 3- 3+ 9.3/18.3 pounds  Middle trapezius     Lower trapezius     Elbow flexion     Elbow extension     Wrist flexion     Wrist extension     Wrist ulnar deviation     Wrist radial deviation     Wrist pronation     Wrist supination     Grip strength (lbs)     (Blank rows = not tested)  TREATMENT:         DATE:  04/02/24 ***    03/19/24:  Manual: Percussion to pt's left shoulder, focusing on infraspinatus, upper trap, and supraspinatus Self Care: Pt edu in self soft tissue mobilizations using tennis ball TherAct Rows: green TB 2 x 10 holding 3 sec, scapular squeeze, slow eccentrics Shoulder extension: red TB 2 x 10  scapular squeeze, slow eccentrics Reaching to first gym shelf c small cone x 5, c 1#  x 5 Reaching to 2nd gym shelf c small cone x 5, c 1# x 5  TherEx: UBE: level 1 x 2 minutes each  direction with 1 minute break in between Standing money bil ER 2 x 10 holding 3 sec Seated upper trap stretch: x 4 bil holding 10 sec Seated levator stretch x 4 bil  holding 10 sec  Seated scalenes stretch x 3 bil holding 10 sec       02/29/2024 Functional Activities: Reaching- Scapular protraction 20 x 3 seconds 2# Overhead function- Supine shoulder flexion 10 x 5 seconds (palms facing in and protract 1st) Reaching behind- Thumb up the back (IR stretch) 10 x 10 seconds  There Ex: Supine ER/IR stretch 20 x 10 seconds in each direction Thera-Band ER/IR Red 10 x 3 seconds with slow eccentrics  97535: Reviewed updated HEP, importance of CONSISTENT HEP compliance and expected objective outcomes   02/24/24 Re-check MMT, ROM, PSFS, updated POC and re-certed, education on all   Supine shoulder flexion 1# AAROM 1x15 Supine chest presses 1# rod x15  Isometric shoulder flexion x15 Isometric shoulder ABD x15   Shoulder PROM mixed with moderate joint oscillations and grade I-III GH mobs as tolerated for flexion and scaption   12/23/2023 Shoulder blade pinches 5 x 5 seconds AAROM for IR and ER (70 degrees abduction) 20 x 10 seconds each direction Side-lie ER 0# 2 sets 10 x slow eccentrics Posterior capsule stretch 5 x 10 seconds  Functional Activities: Reaching:  Supine active-assisted arm raises 20 x 3 seconds, 1# Overhead function: Supine AAROM shoulder flexion 10 x 5 seconds (protract 1st, palm in) Reaching behind: Supine IR and ER stretch 20 x 10 seconds (reaching) Thumb up the back stretch (reach behind the back) 10 x 10 seconds  Ice pack left shoulder 5 minutes post-treatment   PATIENT EDUCATION: Education details: See above Person educated: Patient Education method: Explanation, Demonstration, Tactile cues, Verbal cues, and Handouts Education comprehension: verbalized understanding, returned demonstration, verbal cues required, tactile cues required, and needs further  education  HOME EXERCISE PROGRAM: Access Code: [email or web address] URL: [email or web address] Date: 02/29/2024 Prepared by: Terral Ferrari  Exercises - Standing Scapular Retraction  - 5 x daily - 1 x weekly - 1 sets - 5 reps - 5 second hold - Supine Shoulder Flexion AAROM  - 3 x daily - 7 x weekly - 1 sets - 10 reps - 5 seconds hold - Supine Scapular Protraction in Flexion with Dumbbells  - 2-3 x daily - 7 x weekly - 1 sets - 20-30 reps - 3 seconds hold - Supine Shoulder External Rotation Stretch  - 2 x daily - 7 x weekly - 1 sets - 10 reps - 10 seconds hold - Supine Shoulder Internal Rotation Stretch  - 3-5 x daily - 7 x weekly - 1 sets - 10-20 reps - 5 seconds hold - Sidelying Shoulder External Rotation Dumbbell  - 1 x daily - 1 x weekly - 2 sets - 10 reps - 3 seconds hold - Standing  Shoulder Posterior Capsule Stretch  - 1 x daily - 1 x weekly - 1 sets - 10 reps - 10 seconds hold - Standing Shoulder Internal Rotation Stretch with Hands Behind Back  - 3 x daily - 7 x weekly - 1 sets - 10 reps - 10 seconds hold - Shoulder External Rotation with Anchored Resistance  - 1 x daily - 7 x weekly - 1 sets - 10 reps - 3 hold - Shoulder Internal Rotation with Resistance  - 1 x daily - 7 x weekly - 1 sets - 10 reps - 3 hold  ASSESSMENT:  CLINICAL IMPRESSION: *** Pt arriving today with 6/10 pain in her left shoulder. Pt instructed in self soft tissue mobs and cervical stretching to help with tightness noted in her cervical and shoulder musculature. Pt tolerating treatment with pain with resistive and reaching activities. Recommending continued skilled PT interventions toward goals set.   OBJECTIVE IMPAIRMENTS: decreased activity tolerance, decreased coordination, decreased endurance, decreased knowledge of condition, decreased ROM, decreased strength, decreased safety awareness, increased edema, impaired perceived functional ability, impaired UE functional use, and pain.   ACTIVITY LIMITATIONS:  carrying, lifting, sleeping, dressing, and reach over head  PARTICIPATION LIMITATIONS: community activity and occupation  PERSONAL FACTORS: Asthma, bipolar, back pain, insomnia, depression, seizures, migraines, history of Rt shoulder surgery (clavicle excision) are also affecting patient's functional outcome.   REHAB POTENTIAL: Good  CLINICAL DECISION MAKING: Stable/uncomplicated  EVALUATION COMPLEXITY: Low   GOALS: Goals reviewed with patient? Yes  SHORT TERM GOALS: Target date: 01/06/2024 (did not extend on 02/24/24, all STGs already met)  Improve left shoulder active range of motion for flexion to 135 degrees; IR to 45 degrees; ER to 45 degrees and horizontal adduction to 20 degrees Baseline: 85; 30; 15; 0 respectively Goal status: Met 12/23/2023  2.  Andrea Hood will be independent with her day 1 home exercise program Baseline: Started 11/25/2023 Goal status: Met 12/01/2023  3.  Andrea Hood will be out of her sling 100% of the time without increased left shoulder pain Baseline: In the sling 90+ percent of the time at evaluation Goal status: Met 12/23/2023   LONG TERM GOALS: Target date: 04/20/2024    PSFS to have improved by at least 2 points  Baseline: 44, risk adjusted 34 Goal status: GOAL UPDATED 02/24/24- cone no longer using FOTO   2.  Andrea Hood will report left shoulder pain consistently 0-2/10 on the visual analog scale Baseline: 3-6/10 Goal status: ONGOING 02/29/24  3.  Improve left shoulder active range of motion to 90% or greater of: Flexion 170 degrees; external rotation 90 degrees; internal rotation 60 degrees and horizontal adduction 40 degrees Baseline: See short-term goals Goal status: ONGOING 02/29/24  4.  Improve left shoulder strength to 70% or greater of the uninvolved right at 12 weeks post-surgery Baseline: Deferred secondary to being less than 2 weeks post-surgery Goal status: ONGOING 02/29/24  5.  Andrea Hood will be independent with her long-term home maintenance exercise  program at discharge Baseline: Started 11/25/2023 Goal status: ONGOING 02/29/24  6.  Ambulate when able to reach overhead and behind her back without increased pain or functional limitations at discharge Baseline: Unable at evaluation Goal status: ONGOING 02/29/24   PLAN:  PT FREQUENCY: 1-2X/week   PT DURATION: 8 weeks   PLANNED INTERVENTIONS: 97110-Therapeutic exercises, 97530- Therapeutic activity, 97112- Neuromuscular re-education, 97535- Self Care, [id number]- Manual therapy, 97016- Vasopneumatic device, Patient/Family education, Joint mobilization, Cryotherapy, and Moist heat  PLAN FOR NEXT SESSION: *** She is now 13  weeks out from surgery, should be able to participate in supervised PT without too many restrictions.   Marley Simmers, PT, DPT 04/02/24 7:12 AM

## 2024-04-04 ENCOUNTER — Encounter: Payer: Self-pay | Admitting: Physical Therapy

## 2024-04-04 ENCOUNTER — Ambulatory Visit (INDEPENDENT_AMBULATORY_CARE_PROVIDER_SITE_OTHER): Payer: Self-pay | Admitting: Physical Therapy

## 2024-04-04 DIAGNOSIS — M6281 Muscle weakness (generalized): Secondary | ICD-10-CM

## 2024-04-04 DIAGNOSIS — R600 Localized edema: Secondary | ICD-10-CM

## 2024-04-04 DIAGNOSIS — M25512 Pain in left shoulder: Secondary | ICD-10-CM | POA: Diagnosis not present

## 2024-04-04 DIAGNOSIS — R293 Abnormal posture: Secondary | ICD-10-CM

## 2024-04-04 DIAGNOSIS — G8929 Other chronic pain: Secondary | ICD-10-CM

## 2024-04-04 NOTE — Therapy (Signed)
 OUTPATIENT PHYSICAL THERAPY TREATMENT NOTE   Patient Name: KASLYN RICHBURG MRN: [id number] DOB:15-Jul-1976, 48 y.o., female Today's Date: 04/04/2024  END OF SESSION:  PT End of Session - 04/04/24 0914     Visit Number 9    Number of Visits 24    Date for PT Re-Evaluation 04/20/24    Authorization Type Work Comp    Authorization Time Period Per Edwina Gram from [place] on eval    PT Start Time [phone]    PT Stop Time 0932    PT Time Calculation (min) 41 min    Activity Tolerance Patient tolerated treatment well;Patient limited by pain    Behavior During Therapy [facility] for tasks assessed/performed           Past Medical History:  Diagnosis Date   Asthma    Bipolar 2 disorder (HCC)    BRCA negative 12/2019   MyRisk neg; IBIS=13.4%/riskscore=25.3%   Depression    Family history of breast cancer    Family history of ovarian cancer    Fatty liver    Gastric ulcer    Migraines    Pituitary cyst (HCC)    Seizures (HCC)    Past Surgical History:  Procedure Laterality Date   ABDOMINAL HYSTERECTOMY     CHOLECYSTECTOMY     Patient Active Problem List   Diagnosis Date Noted   Pain in left shoulder 08/02/2023   Backache 11/19/2010   INSOMNIA-SLEEP DISORDER-UNSPEC 08/27/2010   Anxiety state 03/24/2009   EATING DISORDER, UNSPECIFIED 03/24/2009   DEPRESSION 03/24/2009   Migraine headache 03/24/2009   GERD 03/24/2009   Gastric ulcer 03/24/2009   FATIGUE 03/24/2009    PCP: Donette Furlong, MD  REFERRING PROVIDER: Wes Hamman, MD  REFERRING DIAG: [phone] (ICD-10-CM) - Superior labrum anterior-to-posterior (SLAP) tear of left shoulder  THERAPY DIAG:  Abnormal posture  Muscle weakness (generalized)  Chronic left shoulder pain  Localized edema  Rationale for Evaluation and Treatment: Rehabilitation  ONSET DATE: 11/17/2023 surgery was biceps tenodesis and arthroscopic debridement  SUBJECTIVE:                                                                                                                                                                                       SUBJECTIVE STATEMENT: Pt reporting pain today 5/10 in her left shoulder.   PERTINENT HISTORY: Asthma, bipolar, back pain, insomnia, depression, seizures, migraines, history of Rt shoulder surgery (clavicle excision)  PAIN:  NPRS scale: 5/10 this week Pain location: Lt shoulder (deep) Pain description:  stabbing, deep pain, sharp; comes and goes  Aggravating factors: functional use, sleep Relieving factors:  not using it   PRECAUTIONS: Shoulder arthroscopy of  SLAP tear with biceps tenodesis- about 15 weeks out from surgery as of 02/24/24   RED FLAGS: None   WEIGHT BEARING RESTRICTIONS: No  FALLS:  Has patient fallen in last 6 months? 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  NEXT MD VISIT: May 20th 2025  OBJECTIVE:  Note: Objective measures were completed at Evaluation unless otherwise noted.  DIAGNOSTIC FINDINGS:  No fracture or dislocation of the left shoulder. Mild glenohumeral arthrosis.  [facility] at [facility] showed the SLAP tear  PATIENT SURVEYS:  11/25/2023 FOTO 44 (risk-adjusted 34, Goal 58 in 19 visits)   02/24/24- switched to PSFS as cone system no longer using FOTO   Patient-Specific Activity Scoring Scheme  0 represents "unable to perform." 10 represents "able to perform at prior level. 0 1 2 3 4 5 6 7 8 9  10 (Date and Score)   Activity 02/24/24 03/19/24  1. Fastening a bra   0 0  2. Pulling up pants   10 7  3. Functional reaching  5 8  4.Pushing/pulling  8 0  5.    Score 5.75 3.75   Total score = sum of the activity scores/number of activities Minimum detectable change  (90%CI) for average score = 2 points Minimum detectable change (90%CI) for single activity score = 3 points    COGNITION: 11/25/2023 Overall cognitive status: Within functional limits for tasks assessed     SENSATION: 11/25/2023 [facility]  POSTURE: 11/25/2023 Mildly flexed  UPPER EXTREMITY ROM:     Left/Right 11/25/2023 PROM Left 11/30/2023 Supine PROM Left 12/14/2023 Left 12/23/2023 Left sitting 02/24/24 Right sitting 02/24/24 Left 02/29/2024 04/04/24 Left standing  Shoulder flexion 85/170 120 110 140 118* pain 148* 125 130  Shoulder extension          Shoulder abduction  120   93* sharp pain 178*    Shoulder horizontal adduction 0/40  20 30   30    Shoulder internal rotation 30/80  50 70 at 70 degrees abduction Only able to get to side of hip, not able to IR enough to get behind her back  T7 45 degrees at 70 degrees abduction   Shoulder external rotation 15/95 40 in 30 deg abduction 50 75 at 70 degrees abduction Occiput  T4 70 degrees at 70 degrees abduction   Elbow flexion          Elbow extension          Wrist flexion          Wrist extension          Wrist ulnar deviation          Wrist radial deviation          Wrist pronation          Wrist supination          (Blank rows = not tested)  UPPER EXTREMITY STRENGTH: Deferred at evaluation secondary to being less than 2 weeks post-surgery  MMT Left 02/24/24 Right 02/24/24 Left/Right 02/29/2024  Shoulder flexion 3+ 5   Shoulder extension     Shoulder abduction 3 pain limited  5   Shoulder adduction     Shoulder internal rotation 3+ 4 13.4/20.7 pounds  Shoulder external rotation 3- 3+  9.3/18.3 pounds  Middle trapezius     Lower trapezius     Elbow flexion     Elbow extension     Wrist flexion     Wrist extension     Wrist ulnar deviation     Wrist radial deviation     Wrist pronation     Wrist supination     Grip strength (lbs)     (Blank rows = not tested)                                                                                                              TREATMENT:         DATE:  04/04/24 Manual: Percussion to pt's left shoulder, focusing on infraspinatus, upper trap, and supraspinatus TherAct Rows: green TB 2 x 10 holding 3 sec, scapular squeeze, slow eccentrics Standing shoulder extension: 2# bar slow eccentrics, scapular squeezes Reaching to 2nd gym 2# 2 x 10  Wall ladder scaption, concentration on eccentric lowering of left shoulder x 10 (Highest level #24) TherEx: UBE: level 2 x 3 minutes each direction with 1 minute break in between Standing ER red TB 2 x 10 towel under elbow, c green TB x 10  Rolling ball up the wall x 10 holding end range flexion 3-5 seconds    03/19/24:  Manual: Percussion to pt's left shoulder, focusing on infraspinatus, upper trap, and supraspinatus Self Care: Pt edu in self soft tissue mobilizations using tennis ball TherAct Rows: green TB 2 x 10 holding 3 sec, scapular squeeze, slow eccentrics Shoulder extension: red TB 2 x 10  scapular squeeze, slow eccentrics Reaching to first gym shelf c small cone x 5, c 1#  x 5 Reaching to 2nd gym shelf c small cone x 5, c 1# x 5  TherEx: UBE: level 1 x 2 minutes each direction with 1 minute break in between Standing money bil ER 2 x 10 holding 3 sec Seated upper trap stretch: x 4 bil holding 10 sec Seated levator stretch x 4 bil  holding 10 sec  Seated scalenes stretch x 3 bil holding 10 sec   02/29/2024 Functional Activities: Reaching- Scapular protraction 20 x 3 seconds 2# Overhead function- Supine shoulder flexion 10 x 5 seconds (palms facing in and protract 1st) Reaching behind- Thumb up the back (IR stretch) 10 x 10 seconds  There Ex: Supine ER/IR stretch 20 x 10 seconds in each direction Thera-Band ER/IR Red 10 x 3 seconds with slow eccentrics  97535: Reviewed updated HEP, importance of CONSISTENT HEP compliance and expected objective outcomes   PATIENT EDUCATION: Education details: See above Person educated:  Patient Education method: Explanation, Demonstration, Tactile cues, Verbal cues, and Handouts Education comprehension: verbalized understanding, returned demonstration, verbal cues required, tactile cues required, and needs further education  HOME EXERCISE PROGRAM: Access Code: [email or web address] URL: [email or web address] Date: 02/29/2024 Prepared by: Terral Ferrari  Exercises - Standing Scapular Retraction  - 5 x daily - 1 x weekly - 1 sets - 5 reps - 5 second hold - Supine  Shoulder Flexion AAROM  - 3 x daily - 7 x weekly - 1 sets - 10 reps - 5 seconds hold - Supine Scapular Protraction in Flexion with Dumbbells  - 2-3 x daily - 7 x weekly - 1 sets - 20-30 reps - 3 seconds hold - Supine Shoulder External Rotation Stretch  - 2 x daily - 7 x weekly - 1 sets - 10 reps - 10 seconds hold - Supine Shoulder Internal Rotation Stretch  - 3-5 x daily - 7 x weekly - 1 sets - 10-20 reps - 5 seconds hold - Sidelying Shoulder External Rotation Dumbbell  - 1 x daily - 1 x weekly - 2 sets - 10 reps - 3 seconds hold - Standing Shoulder Posterior Capsule Stretch  - 1 x daily - 1 x weekly - 1 sets - 10 reps - 10 seconds hold - Standing Shoulder Internal Rotation Stretch with Hands Behind Back  - 3 x daily - 7 x weekly - 1 sets - 10 reps - 10 seconds hold - Shoulder External Rotation with Anchored Resistance  - 1 x daily - 7 x weekly - 1 sets - 10 reps - 3 hold - Shoulder Internal Rotation with Resistance  - 1 x daily - 7 x weekly - 1 sets - 10 reps - 3 hold  ASSESSMENT:  CLINICAL IMPRESSION: Pt still averaging 5-6/10 pain in her left shoulder. Pt was instructed in scar tissue massage this visit and reported the self STM using tennis ball have helped some. Pt tolerating exercises, still reporting pain with eccentric lowering of her left shoulder. Recommending continued skilled PT interventions.    OBJECTIVE IMPAIRMENTS: decreased activity tolerance, decreased coordination, decreased endurance, decreased  knowledge of condition, decreased ROM, decreased strength, decreased safety awareness, increased edema, impaired perceived functional ability, impaired UE functional use, and pain.   ACTIVITY LIMITATIONS: carrying, lifting, sleeping, dressing, and reach over head  PARTICIPATION LIMITATIONS: community activity and occupation  PERSONAL FACTORS: Asthma, bipolar, back pain, insomnia, depression, seizures, migraines, history of Rt shoulder surgery (clavicle excision) are also affecting patient's functional outcome.   REHAB POTENTIAL: Good  CLINICAL DECISION MAKING: Stable/uncomplicated  EVALUATION COMPLEXITY: Low   GOALS: Goals reviewed with patient? Yes  SHORT TERM GOALS: Target date: 01/06/2024 (did not extend on 02/24/24, all STGs already met)  Improve left shoulder active range of motion for flexion to 135 degrees; IR to 45 degrees; ER to 45 degrees and horizontal adduction to 20 degrees Baseline: 85; 30; 15; 0 respectively Goal status: Met 12/23/2023  2.  Atticus will be independent with her day 1 home exercise program Baseline: Started 11/25/2023 Goal status: Met 12/01/2023  3.  Paisleigh will be out of her sling 100% of the time without increased left shoulder pain Baseline: In the sling 90+ percent of the time at evaluation Goal status: Met 12/23/2023   LONG TERM GOALS: Target date: 04/20/2024    PSFS to have improved by at least 2 points  Baseline: 44, risk adjusted 34 Goal status: GOAL UPDATED 02/24/24- cone no longer using FOTO   2.  Amaris will report left shoulder pain consistently 0-2/10 on the visual analog scale Baseline: 3-6/10 Goal status: ONGOING 02/29/24  3.  Improve left shoulder active range of motion to 90% or greater of: Flexion 170 degrees; external rotation 90 degrees; internal rotation 60 degrees and horizontal adduction 40 degrees Baseline: See short-term goals Goal status: ONGOING 02/29/24  4.  Improve left shoulder strength to 70% or greater of the uninvolved  right  at 12 weeks post-surgery Baseline: Deferred secondary to being less than 2 weeks post-surgery Goal status: ONGOING 02/29/24  5.  Arline will be independent with her long-term home maintenance exercise program at discharge Baseline: Started 11/25/2023 Goal status: ONGOING 02/29/24  6.  Ambulate when able to reach overhead and behind her back without increased pain or functional limitations at discharge Baseline: Unable at evaluation Goal status: ONGOING 02/29/24   PLAN:  PT FREQUENCY: 1-2X/week   PT DURATION: 8 weeks   PLANNED INTERVENTIONS: 97110-Therapeutic exercises, 97530- Therapeutic activity, 97112- Neuromuscular re-education, 97535- Self Care, 16109- Manual therapy, 97016- Vasopneumatic device, Patient/Family education, Joint mobilization, Cryotherapy, and Moist heat  PLAN FOR NEXT SESSION:  She is now 15 weeks out from surgery, should be able to participate in supervised PT without too many restrictions, Begin lifting activities for return to work.   Jerrel Mor, PT, MPT 04/04/24 9:39 AM   04/04/24 9:39 AM

## 2024-04-09 ENCOUNTER — Encounter: Payer: Self-pay | Admitting: Physical Therapy

## 2024-04-09 ENCOUNTER — Ambulatory Visit (INDEPENDENT_AMBULATORY_CARE_PROVIDER_SITE_OTHER): Payer: Worker's Compensation | Admitting: Physical Therapy

## 2024-04-09 DIAGNOSIS — R600 Localized edema: Secondary | ICD-10-CM

## 2024-04-09 DIAGNOSIS — M25512 Pain in left shoulder: Secondary | ICD-10-CM

## 2024-04-09 DIAGNOSIS — R293 Abnormal posture: Secondary | ICD-10-CM | POA: Diagnosis not present

## 2024-04-09 DIAGNOSIS — G8929 Other chronic pain: Secondary | ICD-10-CM

## 2024-04-09 DIAGNOSIS — M6281 Muscle weakness (generalized): Secondary | ICD-10-CM | POA: Diagnosis not present

## 2024-04-09 NOTE — Therapy (Signed)
 OUTPATIENT PHYSICAL THERAPY TREATMENT NOTE   Patient Name: Andrea Hood MRN: [id number] DOB:[birth year], 48 y.o., female Today's Date: 04/09/2024  END OF SESSION:  PT End of Session - 04/09/24 0809     Visit Number 10    Number of Visits 24    Date for PT Re-Evaluation 04/20/24    Authorization Type Work Comp    Authorization Time Period Per [place] from [place] on eval 6 weeks from 03/13/24 to 04/24/24    Authorization - Number of Visits --    Progress Note Due on Visit --    PT Start Time 0802    PT Stop Time 0842    PT Time Calculation (min) 40 min    Activity Tolerance Patient tolerated treatment well;Patient limited by pain    Behavior During Therapy [facility] for tasks assessed/performed            Past Medical History:  Diagnosis Date   Asthma    Bipolar 2 disorder (HCC)    BRCA negative 12/2019   MyRisk neg; IBIS=13.4%/riskscore=25.3%   Depression    Family history of breast cancer    Family history of ovarian cancer    Fatty liver    Gastric ulcer    Migraines    Pituitary cyst (HCC)    Seizures (HCC)    Past Surgical History:  Procedure Laterality Date   ABDOMINAL HYSTERECTOMY     CHOLECYSTECTOMY     Patient Active Problem List   Diagnosis Date Noted   Pain in left shoulder 08/02/2023   Backache 11/19/2010   INSOMNIA-SLEEP DISORDER-UNSPEC 08/27/2010   Anxiety state 03/24/2009   EATING DISORDER, UNSPECIFIED 03/24/2009   DEPRESSION 03/24/2009   Migraine headache 03/24/2009   GERD 03/24/2009   Gastric ulcer 03/24/2009   FATIGUE 03/24/2009    PCP: Oneil Galloway, MD  REFERRING PROVIDER: Kay CHRISTELLA Cummins, MD  REFERRING DIAG: [phone] (ICD-10-CM) - Superior labrum anterior-to-posterior (SLAP) tear of left shoulder  THERAPY DIAG:  Abnormal posture  Muscle weakness (generalized)  Chronic left shoulder pain  Localized edema  Rationale for Evaluation and Treatment: Rehabilitation  ONSET DATE: 11/17/2023 surgery was biceps tenodesis and arthroscopic  debridement  SUBJECTIVE:                                                                                                                                                                                      SUBJECTIVE STATEMENT: Pt reporting overall improvements since beginning therapy, but still reporting irritating pain of 4/10.   PERTINENT HISTORY: Asthma, bipolar, back pain, insomnia, depression, seizures, migraines, history of Rt shoulder surgery (clavicle excision)  PAIN:  NPRS scale: 4/10 today Pain location: Lt shoulder (  deep) Pain description:  stabbing, deep pain, sharp; comes and goes  Aggravating factors: functional use, sleep Relieving factors:  not using it   PRECAUTIONS: Shoulder arthroscopy of SLAP tear with biceps tenodesis- about 15 weeks out from surgery as of 02/24/24   RED FLAGS: None   WEIGHT BEARING RESTRICTIONS: No  FALLS:  Has patient fallen in last 6 months? 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  NEXT MD VISIT: May 20th 2025  OBJECTIVE:  Note: Objective measures were completed at Evaluation unless otherwise noted.  DIAGNOSTIC FINDINGS:  No fracture or dislocation of the left shoulder. Mild glenohumeral arthrosis.  [facility] at [facility] showed the SLAP tear  PATIENT SURVEYS:  11/25/2023 FOTO 44 (risk-adjusted 34, Goal 58 in 19 visits)   02/24/24- switched to PSFS as cone system no longer using FOTO   Patient-Specific Activity Scoring Scheme  0 represents "unable to perform." 10 represents "able to perform at prior level. 0 1 2 3 4 5 6 7 8 9  10 (Date and Score)   Activity 02/24/24 03/19/24  1. Fastening a bra   0 0  2. Pulling up pants   10 7  3.  Functional reaching  5 8  4.Pushing/pulling  8 0  5.    Score 5.75 3.75   Total score = sum of the activity scores/number of activities Minimum detectable change (90%CI) for average score = 2 points Minimum detectable change (90%CI) for single activity score = 3 points    COGNITION: 11/25/2023 Overall cognitive status: Within functional limits for tasks assessed     SENSATION: 11/25/2023 [facility]  POSTURE: 11/25/2023 Mildly flexed  UPPER EXTREMITY ROM:     Left/Right 11/25/2023 PROM Left 11/30/2023 Supine PROM Left 12/14/2023 Left 12/23/2023 Left sitting 02/24/24 Right sitting 02/24/24 Left 02/29/2024 04/04/24 Left standing  Shoulder flexion 85/170 120 110 140 118* pain 148* 125 130  Shoulder extension          Shoulder abduction  120   93* sharp pain 178*    Shoulder horizontal adduction 0/40  20 30   30    Shoulder internal rotation 30/80  50 70 at 70 degrees abduction Only able to get to side of hip, not able to IR enough to get behind her back  T7 45 degrees at 70 degrees abduction   Shoulder external rotation 15/95 40 in 30 deg abduction 50 75 at 70 degrees abduction Occiput  T4 70 degrees at 70 degrees abduction   Elbow flexion          Elbow extension          Wrist flexion          Wrist extension          Wrist ulnar deviation          Wrist radial deviation          Wrist pronation          Wrist supination          (Blank rows = not tested)  UPPER EXTREMITY STRENGTH: Deferred at evaluation secondary to being less than 2 weeks post-surgery  MMT Left 02/24/24 Right 02/24/24 Left/Right 02/29/2024  Shoulder flexion 3+ 5   Shoulder extension  Shoulder abduction 3 pain limited  5   Shoulder adduction     Shoulder internal rotation 3+ 4 13.4/20.7 pounds  Shoulder external rotation 3- 3+ 9.3/18.3 pounds  Middle trapezius     Lower trapezius     Elbow flexion     Elbow extension     Wrist flexion     Wrist extension     Wrist ulnar deviation     Wrist radial deviation      Wrist pronation     Wrist supination     Grip strength (lbs)     (Blank rows = not tested)                  TREATMENT:         DATE:  04/09/24 TherAct Rows: green TB 2 x 10 holding 3 sec, scapular squeeze, slow eccentrics Shoulder extension: green TB 2 x 10 holding 3 sec  Standing shoulder extension: 3# bar slow eccentrics, scapular squeezes Reaching to 2nd gym 5# 2 x 10, 7# x 10 to 2nd shelf, 5# x 10 to 3rd shelf (increased time for pain tolerance) Wall ladder flexion x 5 holding end range to #24, abduction x 5 holding end range to # 24 TherEx: UBE: level 2 x 3 minutes each direction with 1 minute break in between Standing ER red TB 2 x 10 towel under elbow, c green TB x 10  Wall push ups x 10 c ice pack on pt's left shoulder  Side lying sleeper stretch x 10 holding 10 sec c ice pack on pt's Left shoulder                                                                                               TREATMENT:         DATE:  04/04/24 Manual: Percussion to pt's left shoulder, focusing on infraspinatus, upper trap, and supraspinatus TherAct Rows: green TB 2 x 10 holding 3 sec, scapular squeeze, slow eccentrics Standing shoulder extension: 2# bar slow eccentrics, scapular squeezes Reaching to 2nd gym 2# 2 x 10  Wall ladder scaption, concentration on eccentric lowering of left shoulder x 10 (Highest level #24) TherEx: UBE: level 2 x 3 minutes each direction with 1 minute break in between Standing ER red TB 2 x 10 towel under elbow, c green TB x 10  Rolling ball up the wall x 10 holding end range flexion 3-5 seconds    03/19/24:  Manual: Percussion to pt's left shoulder, focusing on infraspinatus, upper trap, and supraspinatus Self Care: Pt edu in self soft tissue mobilizations using tennis ball TherAct Rows: green TB 2 x 10 holding 3 sec, scapular squeeze, slow eccentrics Shoulder extension: red TB 2 x 10  scapular squeeze, slow eccentrics Reaching to first gym shelf c small  cone x 5, c 1#  x 5 Reaching to 2nd gym shelf c small cone x 5, c 1# x 5  TherEx: UBE: level 1 x 2 minutes each direction with 1 minute break in between Standing money bil ER 2 x 10 holding 3 sec Seated upper trap stretch: x  4 bil holding 10 sec Seated levator stretch x 4 bil  holding 10 sec  Seated scalenes stretch x 3 bil holding 10 sec      PATIENT EDUCATION: Education details: See above Person educated: Patient Education method: Explanation, Demonstration, Tactile cues, Verbal cues, and Handouts Education comprehension: verbalized understanding, returned demonstration, verbal cues required, tactile cues required, and needs further education  HOME EXERCISE PROGRAM: Access Code: [email or web address] URL: [email or web address] Date: 02/29/2024 Prepared by: Lamar Ivory  Exercises - Standing Scapular Retraction  - 5 x daily - 1 x weekly - 1 sets - 5 reps - 5 second hold - Supine Shoulder Flexion AAROM  - 3 x daily - 7 x weekly - 1 sets - 10 reps - 5 seconds hold - Supine Scapular Protraction in Flexion with Dumbbells  - 2-3 x daily - 7 x weekly - 1 sets - 20-30 reps - 3 seconds hold - Supine Shoulder External Rotation Stretch  - 2 x daily - 7 x weekly - 1 sets - 10 reps - 10 seconds hold - Supine Shoulder Internal Rotation Stretch  - 3-5 x daily - 7 x weekly - 1 sets - 10-20 reps - 5 seconds hold - Sidelying Shoulder External Rotation Dumbbell  - 1 x daily - 1 x weekly - 2 sets - 10 reps - 3 seconds hold - Standing Shoulder Posterior Capsule Stretch  - 1 x daily - 1 x weekly - 1 sets - 10 reps - 10 seconds hold - Standing Shoulder Internal Rotation Stretch with Hands Behind Back  - 3 x daily - 7 x weekly - 1 sets - 10 reps - 10 seconds hold - Shoulder External Rotation with Anchored Resistance  - 1 x daily - 7 x weekly - 1 sets - 10 reps - 3 hold - Shoulder Internal Rotation with Resistance  - 1 x daily - 7 x weekly - 1 sets - 10 reps - 3 hold  ASSESSMENT:  CLINICAL  IMPRESSION: Pt reporting the scar tissue massage seems to help. Pt reporting 4/10 pain in her left shoulder today. We discussed visit limitation of 04/24/24. Pt reporting she will then begin work conditioning for return to work. Treatment focusing on exercises for lifting and reaching. Body mechanics reviewed with reaching with added weight.  Pt with some limitations due to pain. Recommending continuation of skilled PT interventions.    OBJECTIVE IMPAIRMENTS: decreased activity tolerance, decreased coordination, decreased endurance, decreased knowledge of condition, decreased ROM, decreased strength, decreased safety awareness, increased edema, impaired perceived functional ability, impaired UE functional use, and pain.   ACTIVITY LIMITATIONS: carrying, lifting, sleeping, dressing, and reach over head  PARTICIPATION LIMITATIONS: community activity and occupation  PERSONAL FACTORS: Asthma, bipolar, back pain, insomnia, depression, seizures, migraines, history of Rt shoulder surgery (clavicle excision) are also affecting patient's functional outcome.   REHAB POTENTIAL: Good  CLINICAL DECISION MAKING: Stable/uncomplicated  EVALUATION COMPLEXITY: Low   GOALS: Goals reviewed with patient? Yes  SHORT TERM GOALS: Target date: 01/06/2024 (did not extend on 02/24/24, all STGs already met)  Improve left shoulder active range of motion for flexion to 135 degrees; IR to 45 degrees; ER to 45 degrees and horizontal adduction to 20 degrees Baseline: 85; 30; 15; 0 respectively Goal status: Met 12/23/2023  2.  Kinlie will be independent with her day 1 home exercise program Baseline: Started 11/25/2023 Goal status: Met 12/01/2023  3.  Daron will be out of her sling 100% of the time without increased left shoulder pain Baseline:  In the sling 90+ percent of the time at evaluation Goal status: Met 12/23/2023   LONG TERM GOALS: Target date: 04/20/2024    PSFS to have improved by at least 2 points  Baseline: 44,  risk adjusted 34 Goal status: GOAL UPDATED 02/24/24- cone no longer using FOTO   2.  Jnai will report left shoulder pain consistently 0-2/10 on the visual analog scale Baseline: 3-6/10 Goal status: ONGOING 04/09/24  3.  Improve left shoulder active range of motion to 90% or greater of: Flexion 170 degrees; external rotation 90 degrees; internal rotation 60 degrees and horizontal adduction 40 degrees Baseline: See short-term goals Goal status: ONGOING 04/09/24  4.  Improve left shoulder strength to 70% or greater of the uninvolved right at 12 weeks post-surgery Baseline: Deferred secondary to being less than 2 weeks post-surgery Goal status: ONGOING 04/09/24  5.  Takeya will be independent with her long-term home maintenance exercise program at discharge Baseline: Started 11/25/2023 Goal status: ONGOING 04/09/24  6.  Ambulate when able to reach overhead and behind her back without increased pain or functional limitations at discharge Baseline: Unable at evaluation Goal status: ONGOING 04/09/24  PLAN:  PT FREQUENCY: 1-2X/week   PT DURATION: 6 weeks until 04/24/24  PLANNED INTERVENTIONS: 97110-Therapeutic exercises, 97530- Therapeutic activity, 97112- Neuromuscular re-education, 97535- Self Care, [id number]- Manual therapy, [id number]- Vasopneumatic device, Patient/Family education, Joint mobilization, Cryotherapy, and Moist heat  PLAN FOR NEXT SESSION:  Begin lifting activities for return to work.   Delon Lunger, PT, MPT 04/09/24 8:49 AM   04/09/24 8:49 AM

## 2024-04-11 ENCOUNTER — Encounter: Payer: Self-pay | Admitting: Physical Therapy

## 2024-04-11 ENCOUNTER — Ambulatory Visit (INDEPENDENT_AMBULATORY_CARE_PROVIDER_SITE_OTHER): Payer: Self-pay | Admitting: Physical Therapy

## 2024-04-11 DIAGNOSIS — R600 Localized edema: Secondary | ICD-10-CM | POA: Diagnosis not present

## 2024-04-11 DIAGNOSIS — M6281 Muscle weakness (generalized): Secondary | ICD-10-CM | POA: Diagnosis not present

## 2024-04-11 DIAGNOSIS — R293 Abnormal posture: Secondary | ICD-10-CM

## 2024-04-11 DIAGNOSIS — M25512 Pain in left shoulder: Secondary | ICD-10-CM | POA: Diagnosis not present

## 2024-04-11 DIAGNOSIS — G8929 Other chronic pain: Secondary | ICD-10-CM

## 2024-04-11 NOTE — Therapy (Signed)
 OUTPATIENT PHYSICAL THERAPY TREATMENT NOTE   Patient Name: Andrea Hood MRN: [id number] DOB:1976/05/23, 48 y.o., female Today's Date: 04/11/2024  END OF SESSION:  PT End of Session - 04/11/24 0851     Visit Number 11    Number of Visits 24    Date for PT Re-Evaluation 04/20/24    Authorization Type Work Comp    Authorization Time Period Per [place] from [place] on eval 6 weeks from 03/13/24 to 04/24/24    Progress Note Due on Visit 12    PT Start Time 0848    PT Stop Time 0928    PT Time Calculation (min) 40 min    Activity Tolerance Patient tolerated treatment well;Patient limited by pain    Behavior During Therapy [facility] for tasks assessed/performed             Past Medical History:  Diagnosis Date   Asthma    Bipolar 2 disorder (HCC)    BRCA negative 12/2019   MyRisk neg; IBIS=13.4%/riskscore=25.3%   Depression    Family history of breast cancer    Family history of ovarian cancer    Fatty liver    Gastric ulcer    Migraines    Pituitary cyst (HCC)    Seizures (HCC)    Past Surgical History:  Procedure Laterality Date   ABDOMINAL HYSTERECTOMY     CHOLECYSTECTOMY     Patient Active Problem List   Diagnosis Date Noted   Pain in left shoulder 08/02/2023   Backache 11/19/2010   INSOMNIA-SLEEP DISORDER-UNSPEC 08/27/2010   Anxiety state 03/24/2009   EATING DISORDER, UNSPECIFIED 03/24/2009   DEPRESSION 03/24/2009   Migraine headache 03/24/2009   GERD 03/24/2009   Gastric ulcer 03/24/2009   FATIGUE 03/24/2009    PCP: Oneil Galloway, MD  REFERRING PROVIDER: Kay CHRISTELLA Cummins, MD  REFERRING DIAG: [phone] (ICD-10-CM) - Superior labrum anterior-to-posterior (SLAP) tear of left shoulder  THERAPY DIAG:  Abnormal posture  Muscle weakness (generalized)  Chronic left shoulder pain  Localized edema  Rationale for Evaluation and Treatment: Rehabilitation  ONSET DATE: 11/17/2023 surgery was biceps tenodesis and arthroscopic debridement  SUBJECTIVE:                                                                                                                                                                                       SUBJECTIVE STATEMENT: Pt arriving today reporting 5/10 pain in her left shoulder. Pt reporting with activities her pain can increase.     PERTINENT HISTORY: Asthma, bipolar, back pain, insomnia, depression, seizures, migraines, history of Rt shoulder surgery (clavicle excision)  PAIN:  NPRS scale: 5/10 Pain location: Lt shoulder (deep) Pain description:  stabbing, deep pain, sharp; comes and goes  Aggravating factors: functional use, sleep Relieving factors:  not using it   PRECAUTIONS: Shoulder arthroscopy of SLAP tear with biceps tenodesis- about 15 weeks out from surgery as of 02/24/24   RED FLAGS: None   WEIGHT BEARING RESTRICTIONS: No  FALLS:  Has patient fallen in last 6 months? 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  NEXT MD VISIT: May 20th 2025  OBJECTIVE:  Note: Objective measures were completed at Evaluation unless otherwise noted.  DIAGNOSTIC FINDINGS:  No fracture or dislocation of the left shoulder. Mild glenohumeral arthrosis.  [facility] at [facility] showed the SLAP tear  PATIENT SURVEYS:  11/25/2023 FOTO 44 (risk-adjusted 34, Goal 58 in 19 visits)   02/24/24- switched to PSFS as cone system no longer using FOTO   Patient-Specific Activity Scoring Scheme  0 represents "unable to perform." 10 represents "able to perform at prior level. 0 1 2 3 4 5 6 7 8 9  10 (Date and Score)   Activity 02/24/24 03/19/24 04/11/24  1. Fastening a bra   0 0 0  2. Pulling up pants   10 7 7   3. Functional reaching  5 8 6    4.Pushing/pulling  8 0 2  5.     Score 5.75 3.75 3.75    Total score = sum of the activity scores/number of activities Minimum detectable change (90%CI) for average score = 2 points Minimum detectable change (90%CI) for single activity score = 3 points    COGNITION: 11/25/2023 Overall cognitive status: Within functional limits for tasks assessed     SENSATION: 11/25/2023 [facility]  POSTURE: 11/25/2023 Mildly flexed  UPPER EXTREMITY ROM:     Left/Right 11/25/2023 PROM Left 11/30/2023 Supine PROM Left 12/14/2023 Left 12/23/2023 Left sitting 02/24/24 Right sitting 02/24/24 Left 02/29/2024 04/04/24 Left standing  Shoulder flexion 85/170 120 110 140 118* pain 148* 125 130  Shoulder extension          Shoulder abduction  120   93* sharp pain 178*    Shoulder horizontal adduction 0/40  20 30   30    Shoulder internal rotation 30/80  50 70 at 70 degrees abduction Only able to get to side of hip, not able to IR enough to get behind her back  T7 45 degrees at 70 degrees abduction   Shoulder external rotation 15/95 40 in 30 deg abduction 50 75 at 70 degrees abduction Occiput  T4 70 degrees at 70 degrees abduction   Elbow flexion          Elbow extension          Wrist flexion          Wrist extension          Wrist ulnar deviation          Wrist radial deviation          Wrist pronation          Wrist supination          (Blank rows = not tested)  UPPER EXTREMITY STRENGTH: Deferred at evaluation secondary to being less than 2 weeks post-surgery  MMT Left 02/24/24 Right 02/24/24 Left/Right 02/29/2024 04/11/24 Left / Rt  Shoulder flexion 3+  5  15.5 / 18.9 ppsi  Shoulder extension      Shoulder abduction 3 pain limited  5  9.6 / 19.7 ppsi  Shoulder adduction      Shoulder internal rotation 3+ 4 13.4/20.7 pounds 21.6 / 22.8 ppsi  Shoulder external rotation 3- 3+ 9.3/18.3 pounds 17.8 / 20.5 ppsi  Middle trapezius      Lower trapezius      Elbow flexion      Elbow extension      Wrist flexion       Wrist extension      Wrist ulnar deviation      Wrist radial deviation      Wrist pronation      Wrist supination      Grip strength (lbs)      (Blank rows = not tested)                  TREATMENT:         DATE:  04/11/24 TherAct Rows: green TB 2 x 10 holding 3 sec, scapular squeeze, slow eccentrics Shoulder extension: 3# bar 2 x 15  Dead lift carry: Left UE 10# kettle bell 40 feet x 2, 10# bil UEs 40 feet x 2  5# kettle bell pulls lawn mower crank 2 x 15  TherEx: UBE: level 2 x 3 minutes each direction with 1 minute break in between Standing ER red TB 2 x 15  Wall push ups x 10 c ice pack on pt's left shoulder  Adduction stretch: x 3 holding 30 sec 5# kettle bell bil arm swings 2 x 15  MMT updated see chart above        TREATMENT:         DATE:  04/09/24 TherAct Rows: green TB 2 x 10 holding 3 sec, scapular squeeze, slow eccentrics Shoulder extension: green TB 2 x 10 holding 3 sec  Standing shoulder extension: 3# bar slow eccentrics, scapular squeezes Reaching to 2nd gym 5# 2 x 10, 7# x 10 to 2nd shelf, 5# x 10 to 3rd shelf (increased time for pain tolerance) Wall ladder flexion x 5 holding end range to #24, abduction x 5 holding end range to # 24 TherEx: UBE: level 2 x 3 minutes each direction with 1 minute break in between Standing ER red TB 2 x 10 towel under elbow, c green TB x 10  Wall push ups x 10 c ice pack on pt's left shoulder  Side lying sleeper stretch x 10 holding 10 sec c ice pack on pt's Left shoulder                                                                                               TREATMENT:         DATE:  04/04/24 Manual: Percussion to pt's left shoulder, focusing on infraspinatus, upper trap, and supraspinatus TherAct Rows: green TB 2 x 10 holding 3 sec, scapular squeeze, slow eccentrics Standing shoulder extension: 2# bar slow eccentrics, scapular squeezes Reaching to 2nd gym 2# 2 x 10  Wall ladder scaption, concentration on  eccentric lowering  of left shoulder x 10 (Highest level #24) TherEx: UBE: level 2 x 3 minutes each direction with 1 minute break in between Standing ER red TB 2 x 10 towel under elbow, c green TB x 10  Rolling ball up the wall x 10 holding end range flexion 3-5 seconds         PATIENT EDUCATION: Education details: See above Person educated: Patient Education method: Explanation, Demonstration, Tactile cues, Verbal cues, and Handouts Education comprehension: verbalized understanding, returned demonstration, verbal cues required, tactile cues required, and needs further education  HOME EXERCISE PROGRAM: Access Code: [email or web address] URL: [email or web address] Date: 02/29/2024 Prepared by: Lamar Ivory  Exercises - Standing Scapular Retraction  - 5 x daily - 1 x weekly - 1 sets - 5 reps - 5 second hold - Supine Shoulder Flexion AAROM  - 3 x daily - 7 x weekly - 1 sets - 10 reps - 5 seconds hold - Supine Scapular Protraction in Flexion with Dumbbells  - 2-3 x daily - 7 x weekly - 1 sets - 20-30 reps - 3 seconds hold - Supine Shoulder External Rotation Stretch  - 2 x daily - 7 x weekly - 1 sets - 10 reps - 10 seconds hold - Supine Shoulder Internal Rotation Stretch  - 3-5 x daily - 7 x weekly - 1 sets - 10-20 reps - 5 seconds hold - Sidelying Shoulder External Rotation Dumbbell  - 1 x daily - 1 x weekly - 2 sets - 10 reps - 3 seconds hold - Standing Shoulder Posterior Capsule Stretch  - 1 x daily - 1 x weekly - 1 sets - 10 reps - 10 seconds hold - Standing Shoulder Internal Rotation Stretch with Hands Behind Back  - 3 x daily - 7 x weekly - 1 sets - 10 reps - 10 seconds hold - Shoulder External Rotation with Anchored Resistance  - 1 x daily - 7 x weekly - 1 sets - 10 reps - 3 hold - Shoulder Internal Rotation with Resistance  - 1 x daily - 7 x weekly - 1 sets - 10 reps - 3 hold  ASSESSMENT:  CLINICAL IMPRESSION: Pt still reporting pain averaging 5/10 with every day tasks and  increased pain with overhead, abduction, ER and extension. Pt still unable to reach her bra clasp.  Pt has met her strength goal in flexion, IR, and ER, sill progressing with abduction strength to equal 70% of Rt UE. Recommending continuation of skilled PT progression to focus on improved ROM, Strength and functional mobility.       OBJECTIVE IMPAIRMENTS: decreased activity tolerance, decreased coordination, decreased endurance, decreased knowledge of condition, decreased ROM, decreased strength, decreased safety awareness, increased edema, impaired perceived functional ability, impaired UE functional use, and pain.   ACTIVITY LIMITATIONS: carrying, lifting, sleeping, dressing, and reach over head  PARTICIPATION LIMITATIONS: community activity and occupation  PERSONAL FACTORS: Asthma, bipolar, back pain, insomnia, depression, seizures, migraines, history of Rt shoulder surgery (clavicle excision) are also affecting patient's functional outcome.   REHAB POTENTIAL: Good  CLINICAL DECISION MAKING: Stable/uncomplicated  EVALUATION COMPLEXITY: Low   GOALS: Goals reviewed with patient? Yes  SHORT TERM GOALS: Target date: 01/06/2024 (did not extend on 02/24/24, all STGs already met)  Improve left shoulder active range of motion for flexion to 135 degrees; IR to 45 degrees; ER to 45 degrees and horizontal adduction to 20 degrees Baseline: 85; 30; 15; 0 respectively Goal status: Met 12/23/2023  2.  Andrea Hood will be independent with her  day 1 home exercise program Baseline: Started 11/25/2023 Goal status: Met 12/01/2023  3.  Andrea Hood will be out of her sling 100% of the time without increased left shoulder pain Baseline: In the sling 90+ percent of the time at evaluation Goal status: Met 12/23/2023   LONG TERM GOALS: Target date: 04/20/2024    PSFS to have improved by at least 2 points  Baseline: 44, risk adjusted 34 Goal status: GOAL UPDATED 02/24/24- cone no longer using FOTO  On-going 04/11/24  2.   Andrea Hood will report left shoulder pain consistently 0-2/10 on the visual analog scale Baseline: 3-6/10 Goal status: ONGOING 04/11/24  3.  Improve left shoulder active range of motion to 90% or greater of: Flexion 170 degrees; external rotation 90 degrees; internal rotation 60 degrees and horizontal adduction 40 degrees Baseline: See short-term goals Goal status: ONGOING 04/09/24  4.  Improve left shoulder strength to 70% or greater of the uninvolved right at 12 weeks post-surgery Baseline: Deferred secondary to being less than 2 weeks post-surgery Goal status: Partially Met, still working on abd 04/11/24  5.  Andrea Hood will be independent with her long-term home maintenance exercise program at discharge Baseline: Started 11/25/2023 Goal status: ONGOING 04/11/24  6.  Andrea Hood when able to reach overhead and behind her back without increased pain or functional limitations at discharge Baseline: Unable at evaluation Goal status: ONGOING 04/09/24  PLAN:  PT FREQUENCY: 1-2X/week   PT DURATION: 6 weeks until 04/24/24  PLANNED INTERVENTIONS: 97110-Therapeutic exercises, 97530- Therapeutic activity, 97112- Neuromuscular re-education, 97535- Self Care, [id number]- Manual therapy, [id number]- Vasopneumatic device, Patient/Family education, Joint mobilization, Cryotherapy, and Moist heat  PLAN FOR NEXT SESSION:  Begin lifting activities for return to work.   Delon Lunger, PT, MPT 04/11/24 9:33 AM   04/11/24 9:33 AM

## 2024-04-16 ENCOUNTER — Encounter: Admitting: Physical Therapy

## 2024-04-18 ENCOUNTER — Encounter: Payer: Self-pay | Admitting: Physical Therapy

## 2024-04-18 ENCOUNTER — Ambulatory Visit (INDEPENDENT_AMBULATORY_CARE_PROVIDER_SITE_OTHER): Payer: Worker's Compensation | Admitting: Physical Therapy

## 2024-04-18 DIAGNOSIS — M25512 Pain in left shoulder: Secondary | ICD-10-CM

## 2024-04-18 DIAGNOSIS — M6281 Muscle weakness (generalized): Secondary | ICD-10-CM | POA: Diagnosis not present

## 2024-04-18 DIAGNOSIS — G8929 Other chronic pain: Secondary | ICD-10-CM

## 2024-04-18 DIAGNOSIS — R293 Abnormal posture: Secondary | ICD-10-CM | POA: Diagnosis not present

## 2024-04-18 DIAGNOSIS — R600 Localized edema: Secondary | ICD-10-CM

## 2024-04-18 NOTE — Therapy (Signed)
 OUTPATIENT PHYSICAL THERAPY TREATMENT NOTE Progress Note: Re-certification   Patient Name: Andrea Hood MRN: [id number] DOB:1976/04/10, 48 y.o., female Today's Date: 04/18/2024  Progress Note Reporting Period 02/24/24 to 04/18/2024   See note below for Objective Data and Assessment of Progress/Goals.     END OF SESSION:  PT End of Session - 04/18/24 0845     Visit Number 12    Number of Visits 24    Date for PT Re-Evaluation 04/27/24    Authorization Type Work Comp    Authorization Time Period Per [place] from [place] on eval 6 weeks from 03/13/24 to 04/24/24    Authorization - Number of Visits 12    Progress Note Due on Visit 12    PT Start Time 0846    PT Stop Time 0930    PT Time Calculation (min) 44 min    Activity Tolerance Patient tolerated treatment well;Patient limited by pain    Behavior During Therapy [facility] for tasks assessed/performed             Past Medical History:  Diagnosis Date   Asthma    Bipolar 2 disorder (HCC)    BRCA negative 12/2019   MyRisk neg; IBIS=13.4%/riskscore=25.3%   Depression    Family history of breast cancer    Family history of ovarian cancer    Fatty liver    Gastric ulcer    Migraines    Pituitary cyst (HCC)    Seizures (HCC)    Past Surgical History:  Procedure Laterality Date   ABDOMINAL HYSTERECTOMY     CHOLECYSTECTOMY     Patient Active Problem List   Diagnosis Date Noted   Pain in left shoulder 08/02/2023   Backache 11/19/2010   INSOMNIA-SLEEP DISORDER-UNSPEC 08/27/2010   Anxiety state 03/24/2009   EATING DISORDER, UNSPECIFIED 03/24/2009   DEPRESSION 03/24/2009   Migraine headache 03/24/2009   GERD 03/24/2009   Gastric ulcer 03/24/2009   FATIGUE 03/24/2009    PCP: Oneil Galloway, MD  REFERRING PROVIDER: Kay CHRISTELLA Cummins, MD  REFERRING DIAG: [phone] (ICD-10-CM) - Superior labrum anterior-to-posterior (SLAP) tear of left shoulder  THERAPY DIAG:  Abnormal posture  Muscle weakness (generalized)  Chronic left  shoulder pain  Localized edema  Rationale for Evaluation and Treatment: Rehabilitation  ONSET DATE: 11/17/2023 surgery was biceps tenodesis and arthroscopic debridement  SUBJECTIVE:                                                                                                                                                                                      SUBJECTIVE STATEMENT: Pt arriving today reporting 3/10 pain in her left shoulder. Pt stating with activities her pain can  increase.      PERTINENT HISTORY: Asthma, bipolar, back pain, insomnia, depression, seizures, migraines, history of Rt shoulder surgery (clavicle excision)  PAIN:  NPRS scale: 3/10 Pain location: Lt shoulder (deep) Pain description:  stabbing, deep pain, sharp; comes and goes  Aggravating factors: functional use, sleep Relieving factors:  not using it   PRECAUTIONS: Shoulder arthroscopy of SLAP tear with biceps tenodesis- about 15 weeks out from surgery as of 02/24/24   RED FLAGS: None   WEIGHT BEARING RESTRICTIONS: No  FALLS:  Has patient fallen in last 6 months? 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  NEXT MD VISIT: May 20th 2025  OBJECTIVE:  Note: Objective measures were completed at Evaluation unless otherwise noted.  DIAGNOSTIC FINDINGS:  No fracture or dislocation of the left shoulder. Mild glenohumeral arthrosis.  MRI at [facility] showed the SLAP tear  PATIENT SURVEYS:  11/25/2023 FOTO 44 (risk-adjusted 34, Goal 58 in 19 visits) - no longer using this data 04/18/24   02/24/24- switched to PSFS as cone system no longer using FOTO   Patient-Specific Activity Scoring Scheme  0 represents  "unable to perform." 10 represents "able to perform at prior level. 0 1 2 3 4 5 6 7 8 9  10 (Date and Score)   Activity 02/24/24 03/19/24 04/11/24  1. Fastening a bra   0 0 0  2. Pulling up pants   10 7 7   3. Functional reaching  5 8 6   4.Pushing/pulling  8 0 2  5.     Score 5.75 3.75 3.75    Total score = sum of the activity scores/number of activities Minimum detectable change (90%CI) for average score = 2 points Minimum detectable change (90%CI) for single activity score = 3 points    COGNITION: 11/25/2023 Overall cognitive status: Within functional limits for tasks assessed     SENSATION: 11/25/2023 [facility]  POSTURE: 11/25/2023 Mildly flexed  UPPER EXTREMITY ROM:     Left/Right 11/25/2023 PROM Left 11/30/2023 Supine PROM Left 12/14/2023 Left 12/23/2023 Left sitting 02/24/24 Right sitting 02/24/24 Left  02/29/2024 04/04/24 Left standing 04/18/24 Left supine  Shoulder flexion 85/170 120 110 140 118* pain 148* 125 130 145  Shoulder extension         48  Shoulder abduction  120   93* sharp pain 178*     Shoulder horizontal adduction 0/40  20 30   30     Shoulder internal rotation 30/80  50 70 at 70 degrees abduction Only able to get to side of hip, not able to IR enough to get behind her back  T7 45 degrees at 70 degrees abduction  60 deg, shoulder abd 90 deg  Shoulder external rotation 15/95 40 in 30 deg abduction 50 75 at 70 degrees abduction Occiput  T4 70 degrees at 70 degrees abduction  75 deg, shoulder 90 abd  Elbow flexion           Elbow extension           Wrist flexion           Wrist extension           Wrist ulnar deviation           Wrist radial  deviation           Wrist pronation           Wrist supination           (Blank rows = not tested)  UPPER EXTREMITY STRENGTH: Deferred at evaluation secondary to being less than 2 weeks post-surgery  MMT Left 02/24/24 Right 02/24/24 Left/Right 02/29/2024 04/11/24 Left / Rt  Shoulder flexion 3+ 5  15.5 / 18.9 ppsi  Shoulder extension       Shoulder abduction 3 pain limited  5  9.6 / 19.7 ppsi  Shoulder adduction      Shoulder internal rotation 3+ 4 13.4/20.7 pounds 21.6 / 22.8 ppsi  Shoulder external rotation 3- 3+ 9.3/18.3 pounds 17.8 / 20.5 ppsi  Middle trapezius      Lower trapezius      Elbow flexion      Elbow extension      Wrist flexion      Wrist extension      Wrist ulnar deviation      Wrist radial deviation      Wrist pronation      Wrist supination      Grip strength (lbs)      (Blank rows = not tested)                   TREATMENT:         DATE:  04/18/24 TherAct BATCA rows: 25# 2 x 10  BATCA pull downs 2 x 10 c 15#  10# dead lift using Left UE with shoulder flexion to 130 deg Reaching into 1st, 2nd and 3rd gym shelf x 10 , added 1 # weight x 15 to 3rd shelf, 2# weight to 3rd shelf, and 3 # weight ot 3rd shelf TherEx: UBE: level 2 x 3 minutes each direction with 1 minute break in between Standing ER green TB 2 x 15  Walk outs reactive isometrics left shoulder ER BATCA cables        TREATMENT:         DATE:  04/11/24 TherAct Rows: green TB 2 x 10 holding 3 sec, scapular squeeze, slow eccentrics Shoulder extension: 3# bar 2 x 15  Dead lift carry: Left UE 10# kettle bell 40 feet x 2, 10# bil UEs 40 feet x 2  5# kettle bell pulls lawn mower crank 2 x 15  TherEx: UBE: level 2 x 3 minutes each direction with 1 minute break in between Standing ER red TB 2 x 15  Wall push ups x 10 c ice pack on pt's left shoulder  Adduction stretch: x 3 holding 30 sec 5# kettle bell bil arm swings 2 x 15  MMT updated see chart above        TREATMENT:         DATE:  04/09/24 TherAct Rows: green TB 2 x 10 holding 3 sec, scapular squeeze, slow eccentrics Shoulder extension: green TB 2 x 10 holding 3 sec  Standing shoulder extension: 3# bar slow eccentrics, scapular squeezes Reaching to 2nd gym 5# 2 x 10, 7# x 10 to 2nd shelf, 5# x 10 to 3rd shelf (increased time for pain tolerance) Wall ladder  flexion x 5 holding end range to #24, abduction x 5 holding end range to # 24 TherEx: UBE: level 2 x 3 minutes each direction with 1 minute break in between Standing ER red TB 2 x 10 towel under elbow, c green TB x 10  Wall push ups  x 10 c ice pack on pt's left shoulder  Side lying sleeper stretch x 10 holding 10 sec c ice pack on pt's Left shoulder                                                                                               TREATMENT:         DATE:  04/04/24 Manual: Percussion to pt's left shoulder, focusing on infraspinatus, upper trap, and supraspinatus TherAct Rows: green TB 2 x 10 holding 3 sec, scapular squeeze, slow eccentrics Standing shoulder extension: 2# bar slow eccentrics, scapular squeezes Reaching to 2nd gym 2# 2 x 10  Wall ladder scaption, concentration on eccentric lowering of left shoulder x 10 (Highest level #24) TherEx: UBE: level 2 x 3 minutes each direction with 1 minute break in between Standing ER red TB 2 x 10 towel under elbow, c green TB x 10  Rolling ball up the wall x 10 holding end range flexion 3-5 seconds    PATIENT EDUCATION: Education details: See above Person educated: Patient Education method: Explanation, Demonstration, Tactile cues, Verbal cues, and Handouts Education comprehension: verbalized understanding, returned demonstration, verbal cues required, tactile cues required, and needs further education  HOME EXERCISE PROGRAM: Access Code: [email or web address] URL: [email or web address] Date: 02/29/2024 Prepared by: Lamar Ivory  Exercises - Standing Scapular Retraction  - 5 x daily - 1 x weekly - 1 sets - 5 reps - 5 second hold - Supine Shoulder Flexion AAROM  - 3 x daily - 7 x weekly - 1 sets - 10 reps - 5 seconds hold - Supine Scapular Protraction in Flexion with Dumbbells  - 2-3 x daily - 7 x weekly - 1 sets - 20-30 reps - 3 seconds hold - Supine Shoulder External Rotation Stretch  - 2 x daily - 7 x weekly - 1 sets  - 10 reps - 10 seconds hold - Supine Shoulder Internal Rotation Stretch  - 3-5 x daily - 7 x weekly - 1 sets - 10-20 reps - 5 seconds hold - Sidelying Shoulder External Rotation Dumbbell  - 1 x daily - 1 x weekly - 2 sets - 10 reps - 3 seconds hold - Standing Shoulder Posterior Capsule Stretch  - 1 x daily - 1 x weekly - 1 sets - 10 reps - 10 seconds hold - Standing Shoulder Internal Rotation Stretch with Hands Behind Back  - 3 x daily - 7 x weekly - 1 sets - 10 reps - 10 seconds hold - Shoulder External Rotation with Anchored Resistance  - 1 x daily - 7 x weekly - 1 sets - 10 reps - 3 hold - Shoulder Internal Rotation with Resistance  - 1 x daily - 7 x weekly - 1 sets - 10 reps - 3 hold  ASSESSMENT:  CLINICAL IMPRESSION: Pt has one more visit approved by worker's comp. Pt has progressed with her active ROM since beginning therapy still reporting on average 3-4/10 pain and increased pain with certain lifting and reaching activities. I am requesting one additional visit on her certification POC to perform a final update of pt's  HEP. Planning for skilled PT discharge and pt to transition to work hardening/conditioning.       OBJECTIVE IMPAIRMENTS: decreased activity tolerance, decreased coordination, decreased endurance, decreased knowledge of condition, decreased ROM, decreased strength, decreased safety awareness, increased edema, impaired perceived functional ability, impaired UE functional use, and pain.   ACTIVITY LIMITATIONS: carrying, lifting, sleeping, dressing, and reach over head  PARTICIPATION LIMITATIONS: community activity and occupation  PERSONAL FACTORS: Asthma, bipolar, back pain, insomnia, depression, seizures, migraines, history of Rt shoulder surgery (clavicle excision) are also affecting patient's functional outcome.   REHAB POTENTIAL: Good  CLINICAL DECISION MAKING: Stable/uncomplicated  EVALUATION COMPLEXITY: Low   GOALS: Goals reviewed with patient? Yes  SHORT  TERM GOALS: Target date: 01/06/2024 (did not extend on 02/24/24, all STGs already met)  Improve left shoulder active range of motion for flexion to 135 degrees; IR to 45 degrees; ER to 45 degrees and horizontal adduction to 20 degrees Baseline: 85; 30; 15; 0 respectively Goal status: Met 12/23/2023  2.  Kihanna will be independent with her day 1 home exercise program Baseline: Started 11/25/2023 Goal status: Met 12/01/2023  3.  Eleshia will be out of her sling 100% of the time without increased left shoulder pain Baseline: In the sling 90+ percent of the time at evaluation Goal status: Met 12/23/2023   LONG TERM GOALS: Target date: 04/27/2024    PSFS to have improved by at least 2 points  Baseline: 44, risk adjusted 34 Goal status: GOAL UPDATED 02/24/24- cone no longer using FOTO  On-going 04/11/24  2.  Nikiesha will report left shoulder pain consistently 0-2/10 on the visual analog scale Baseline: 3-6/10 Goal status: ONGOING 04/18/24  3.  Improve left shoulder active range of motion to 90% or greater of: Flexion 170 degrees; external rotation 90 degrees; internal rotation 60 degrees and horizontal adduction 40 degrees Baseline: See short-term goals Goal status: ONGOING 04/18/24  4.  Improve left shoulder strength to 70% or greater of the uninvolved right at 12 weeks post-surgery Baseline: Deferred secondary to being less than 2 weeks post-surgery Goal status: Partially Met, still working on abd 04/11/24  5.  Eulia will be independent with her long-term home maintenance exercise program at discharge Baseline: Started 11/25/2023 Goal status: ONGOING 04/11/24  6.  Kerstie when able to reach overhead and behind her back without increased pain or functional limitations at discharge Baseline: Unable at evaluation Goal status: ONGOING 04/09/24  PLAN:  PT FREQUENCY: 1-2X/week   PT DURATION: 6 weeks until 04/24/24  PLANNED INTERVENTIONS: 97110-Therapeutic exercises, 97530- Therapeutic activity, 97112-  Neuromuscular re-education, 97535- Self Care, [id number]- Manual therapy, [id number]- Vasopneumatic device, Patient/Family education, Joint mobilization, Cryotherapy, and Moist heat  PLAN FOR NEXT SESSION:  Begin lifting activities for return to work.  Discharge next Monday 04/23/24   Delon Lunger, PT, MPT 04/18/24 9:32 AM   04/18/24 9:32 AM

## 2024-04-23 ENCOUNTER — Encounter: Payer: Self-pay | Admitting: Physical Therapy

## 2024-04-23 ENCOUNTER — Ambulatory Visit (INDEPENDENT_AMBULATORY_CARE_PROVIDER_SITE_OTHER): Payer: Worker's Compensation | Admitting: Physical Therapy

## 2024-04-23 DIAGNOSIS — M6281 Muscle weakness (generalized): Secondary | ICD-10-CM | POA: Diagnosis not present

## 2024-04-23 DIAGNOSIS — R293 Abnormal posture: Secondary | ICD-10-CM | POA: Diagnosis not present

## 2024-04-23 DIAGNOSIS — R600 Localized edema: Secondary | ICD-10-CM

## 2024-04-23 DIAGNOSIS — M25512 Pain in left shoulder: Secondary | ICD-10-CM

## 2024-04-23 DIAGNOSIS — G8929 Other chronic pain: Secondary | ICD-10-CM

## 2024-04-23 NOTE — Therapy (Addendum)
 OUTPATIENT PHYSICAL THERAPY TREATMENT NOTE Discharge Summary   Patient Name: Andrea Hood MRN: [id number] DOB:13-Jan-1976, 48 y.o., female Today's Date: 04/23/2024      END OF SESSION:  PT End of Session - 04/23/24 0807     Visit Number 13    Number of Visits 24    Date for PT Re-Evaluation 04/27/24    Authorization Type Work Comp    Authorization Time Period Per [place] from [place] on eval 6 weeks from 03/13/24 to 04/24/24    PT Start Time 0803    PT Stop Time 0843    PT Time Calculation (min) 40 min    Activity Tolerance Patient tolerated treatment well;Patient limited by pain    Behavior During Therapy [facility] for tasks assessed/performed             Past Medical History:  Diagnosis Date   Asthma    Bipolar 2 disorder (HCC)    BRCA negative 12/2019   MyRisk neg; IBIS=13.4%/riskscore=25.3%   Depression    Family history of breast cancer    Family history of ovarian cancer    Fatty liver    Gastric ulcer    Migraines    Pituitary cyst (HCC)    Seizures (HCC)    Past Surgical History:  Procedure Laterality Date   ABDOMINAL HYSTERECTOMY     CHOLECYSTECTOMY     Patient Active Problem List   Diagnosis Date Noted   Pain in left shoulder 08/02/2023   Backache 11/19/2010   INSOMNIA-SLEEP DISORDER-UNSPEC 08/27/2010   Anxiety state 03/24/2009   EATING DISORDER, UNSPECIFIED 03/24/2009   DEPRESSION 03/24/2009   Migraine headache 03/24/2009   GERD 03/24/2009   Gastric ulcer 03/24/2009   FATIGUE 03/24/2009    PCP: Oneil Galloway, MD  REFERRING PROVIDER: Kay CHRISTELLA Cummins, MD  REFERRING DIAG: [phone] (ICD-10-CM) - Superior labrum anterior-to-posterior (SLAP) tear of left shoulder  THERAPY DIAG:  Abnormal posture  Muscle weakness (generalized)  Chronic left shoulder pain  Localized edema  Rationale for Evaluation and Treatment: Rehabilitation  ONSET DATE: 11/17/2023 surgery was biceps tenodesis and arthroscopic debridement  SUBJECTIVE:                                                                                                                                                                                       SUBJECTIVE STATEMENT: Pt arriving today reporting 3/10 pain in her left shoulder. Pt stating with activities her pain can increase.      PERTINENT HISTORY: Asthma, bipolar, back pain, insomnia, depression, seizures, migraines, history of Rt shoulder surgery (clavicle excision)  PAIN:  NPRS scale: 3/10 Pain location: Lt shoulder (deep) Pain description:  stabbing,  deep pain, sharp; comes and goes  Aggravating factors: functional use, sleep Relieving factors:  not using it   PRECAUTIONS: Shoulder arthroscopy of SLAP tear with biceps tenodesis- about 15 weeks out from surgery as of 02/24/24   RED FLAGS: None   WEIGHT BEARING RESTRICTIONS: No  FALLS:  Has patient fallen in last 6 months? 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  NEXT MD VISIT: May 20th 2025  OBJECTIVE:  Note: Objective measures were completed at Evaluation unless otherwise noted.  DIAGNOSTIC FINDINGS:  No fracture or dislocation of the left shoulder. Mild glenohumeral arthrosis.  MRI at [facility] showed the SLAP tear  PATIENT SURVEYS:  11/25/2023 FOTO 44 (risk-adjusted 34, Goal 58 in 19 visits) - no longer using this data 04/18/24   02/24/24- switched to PSFS as cone system no longer using FOTO   Patient-Specific Activity Scoring Scheme  0 represents "unable to perform." 10 represents "able to perform at prior level. 0 1 2 3 4 5 6 7 8 9  10 (Date and Score)   Activity 02/24/24 03/19/24 04/11/24 7/725  1. Fastening a bra   0 0 0 0  2. Pulling up pants   10 7 7 8    3. Functional reaching  5 8 6 10   4.Pushing/pulling  8 0 2 7  5.      Score 5.75 3.75 3.75  6.25   Total score = sum of the activity scores/number of activities Minimum detectable change (90%CI) for average score = 2 points Minimum detectable change (90%CI) for single activity score = 3 points    COGNITION: 11/25/2023 Overall cognitive status: Within functional limits for tasks assessed     SENSATION: 11/25/2023 [facility]  POSTURE: 11/25/2023 Mildly flexed  UPPER EXTREMITY ROM:     Left/Right 11/25/2023 PROM Left 11/30/2023 Supine PROM Left 12/14/2023 Left 12/23/2023 Left sitting 02/24/24 Right sitting 02/24/24 Left  02/29/2024 04/04/24 Left standing 04/18/24 Left supine 04/23/24 Left supine  Shoulder flexion 85/170 120 110 140 118* pain 148* 125 130 145 145  Shoulder extension         48   Shoulder abduction  120   93* sharp pain 178*      Shoulder horizontal adduction 0/40  20 30   30      Shoulder internal rotation 30/80  50 70 at 70 degrees abduction Only able to get to side of hip, not able to IR enough to get behind her back  T7 45 degrees at 70 degrees abduction  60 deg, shoulder abd 90 deg 74 deg, shoulder abd 90 deg  Shoulder external rotation 15/95 40 in 30 deg abduction 50 75 at 70 degrees abduction Occiput  T4 70 degrees at 70 degrees abduction  75 deg, shoulder abd 90 deg 75 deg Shoulder abd 90 deg  Elbow flexion            Elbow extension            Wrist flexion            Wrist extension            Wrist ulnar deviation            Wrist radial deviation  Wrist pronation            Wrist supination            (Blank rows = not tested)  UPPER EXTREMITY STRENGTH: Deferred at evaluation secondary to being less than 2 weeks post-surgery  MMT Left 02/24/24 Right 02/24/24 Left/Right 02/29/2024 04/11/24 Left / Rt 04/23/24 Left  Shoulder flexion 3+ 5  15.5 / 18.9 ppsi 19.1 lbs  Shoulder extension       Shoulder abduction 3 pain limited  5  9.6 / 19.7 ppsi 14.5  lbs   Shoulder adduction       Shoulder internal rotation 3+ 4 13.4/20.7 pounds 21.6 / 22.8 ppsi 22.1  lbs  Shoulder external rotation 3- 3+ 9.3/18.3 pounds 17.8 / 20.5 ppsi 17.8 lbs  Middle trapezius       Lower trapezius       Elbow flexion       Elbow extension       Wrist flexion       Wrist extension       Wrist ulnar deviation       Wrist radial deviation       Wrist pronation       Wrist supination       Grip strength (lbs)       (Blank rows = not tested)                 TREATMENT:         DATE:  04/23/24 TherAct Reaching into 3rd gym shelf x 5 c 3# weight  Rows: blue TB 2 x 15 holding 3 sec Shoulder Extension green TB 2 x 15 holding 3 sec TherEx: UBE: level 2 x 3 minutes each direction with 1 minute break in between Reviewed and demonstrated all of pt's updated HEP.  Updated MMT and ROM      TREATMENT:         DATE:  04/18/24 TherAct BATCA rows: 25# 2 x 10  BATCA pull downs 2 x 10 c 15#  10# dead lift using Left UE with shoulder flexion to 130 deg Reaching into 1st, 2nd and 3rd gym shelf x 10 , added 1 # weight x 15 to 3rd shelf, 2# weight to 3rd shelf, and 3 # weight ot 3rd shelf TherEx: UBE: level 2 x 3 minutes each direction with 1 minute break in between Standing ER green TB 2 x 15  Walk outs reactive isometrics left shoulder ER BATCA cables        TREATMENT:         DATE:  04/11/24 TherAct Rows: green TB 2 x 10 holding 3 sec, scapular squeeze, slow eccentrics Shoulder extension: 3# bar 2 x 15  Dead lift carry: Left UE 10# kettle bell 40 feet x 2, 10# bil UEs 40 feet x 2  5# kettle bell pulls lawn mower crank 2 x 15  TherEx: UBE: level 2 x 3 minutes each direction with 1 minute break in between Standing ER red TB 2 x 15  Wall push ups x 10 c ice pack on pt's left shoulder  Adduction stretch: x 3 holding 30 sec 5# kettle bell bil arm swings 2 x 15  MMT updated see chart above         PATIENT EDUCATION: Education details: See above Person  educated: Patient Education method: Explanation, Demonstration, Tactile cues, Verbal cues, and Handouts Education comprehension: verbalized understanding, returned demonstration, verbal cues required, tactile cues required, and needs  further education  HOME EXERCISE PROGRAM: Access Code: [email or web address] URL: [email or web address] Date: 04/23/2024 Prepared by: Delon Lunger  Exercises - Standing Shoulder Posterior Capsule Stretch  - 1 x daily - 1 x weekly - 1 sets - 10 reps - 10 seconds hold - Standing Shoulder Internal Rotation Stretch with Hands Behind Back  - 3 x daily - 7 x weekly - 1 sets - 10 reps - 10 seconds hold - Shoulder External Rotation with Anchored Resistance  - 1 x daily - 7 x weekly - 1 sets - 10 reps - 3 hold - Shoulder Internal Rotation with Resistance  - 1 x daily - 7 x weekly - 1 sets - 10 reps - 3 hold - Standing Row with Anchored Resistance  - 1 x daily - 7 x weekly - 3 sets - 10 reps - Supine Shoulder Flexion Extension AAROM with Dowel  - 1 x daily - 7 x weekly - 10 reps - Sidelying Shoulder External Rotation Dumbbell  - 1 x daily - 1 x weekly - 2 sets - 10 reps - 3 seconds hold - Supine Shoulder External Rotation with Dowel  - 1 x daily - 7 x weekly - 10 reps - Supine Shoulder Internal Rotation Stretch  - 3-5 x daily - 7 x weekly - 1 sets - 10-20 reps - 5 seconds hold - Supine Scapular Protraction in Flexion with Dumbbells  - 2-3 x daily - 7 x weekly - 1 sets - 20-30 reps - 3 seconds hold  ASSESSMENT:  CLINICAL IMPRESSION: Pt is being discharged from skilled PT services and transferring to work hardening. Pt has improved in her overall ROM, still with pain limitations with end range of flexion, abcd, ER, and IR. Pt still unable to reach her bra strap for donning and doffing her bra. Pt is being discharged from skilled PT services with all STG's met and pt still progressing with LTG's. Pt's HEP was updated this visit.       OBJECTIVE IMPAIRMENTS:  decreased activity tolerance, decreased coordination, decreased endurance, decreased knowledge of condition, decreased ROM, decreased strength, decreased safety awareness, increased edema, impaired perceived functional ability, impaired UE functional use, and pain.   ACTIVITY LIMITATIONS: carrying, lifting, sleeping, dressing, and reach over head  PARTICIPATION LIMITATIONS: community activity and occupation  PERSONAL FACTORS: Asthma, bipolar, back pain, insomnia, depression, seizures, migraines, history of Rt shoulder surgery (clavicle excision) are also affecting patient's functional outcome.   REHAB POTENTIAL: Good  CLINICAL DECISION MAKING: Stable/uncomplicated  EVALUATION COMPLEXITY: Low   GOALS: Goals reviewed with patient? Yes  SHORT TERM GOALS: Target date: 01/06/2024 (did not extend on 02/24/24, all STGs already met)  Improve left shoulder active range of motion for flexion to 135 degrees; IR to 45 degrees; ER to 45 degrees and horizontal adduction to 20 degrees Baseline: 85; 30; 15; 0 respectively Goal status: Met 12/23/2023  2.  Micki will be independent with her day 1 home exercise program Baseline: Started 11/25/2023 Goal status: Met 12/01/2023  3.  Sandar will be out of her sling 100% of the time without increased left shoulder pain Baseline: In the sling 90+ percent of the time at evaluation Goal status: Met 12/23/2023   LONG TERM GOALS: Target date: 04/27/2024    PSFS to have improved by at least 2 points  Baseline: 44, risk adjusted 34 Goal status: GOAL UPDATED 02/24/24- cone no longer using FOTO  On-going 04/11/24 Partially Met: 04/23/24:  Pt currently at 6.25, but feels she miss understood the  original scoring at her evaluation. Pt has improved by more than 2 points since the first of June when the test was repeated a second time.   2.  Jordanna will report left shoulder pain consistently 0-2/10 on the visual analog scale Baseline: 5/10 Goal status: Not MET 04/23/24  3.   Improve left shoulder active range of motion to 90% or greater of: Flexion 170 degrees; external rotation 90 degrees; internal rotation 60 degrees and horizontal adduction 40 degrees Baseline: See short-term goals Goal status: Not MET  04/23/24  4.  Improve left shoulder strength to 70% or greater of the uninvolved right at 12 weeks post-surgery Baseline: Deferred secondary to being less than 2 weeks post-surgery Goal status: Not Met, 04/23/24  5.  Melanny will be independent with her long-term home maintenance exercise program at discharge Baseline: Started 11/25/2023 Goal status: MET 04/23/24  6.  Katielynn when able to reach overhead and behind her back without increased pain or functional limitations at discharge Baseline: Unable at evaluation Goal status: Not Met, pt still with IR limitations in ROM with pain noted. Pt able to reach overhead with pain end range. 04/23/24    PLAN:  PT FREQUENCY: 1-2X/week   PT DURATION: 6 weeks until 04/24/24  PLANNED INTERVENTIONS: 97110-Therapeutic exercises, 97530- Therapeutic activity, 97112- Neuromuscular re-education, 97535- Self Care, 02859- Manual therapy, 97016- Vasopneumatic device, Patient/Family education, Joint mobilization, Cryotherapy, and Moist heat     Delon Lunger, PT, MPT 04/23/24 8:58 AM   04/23/24 8:58 AM   PHYSICAL THERAPY DISCHARGE SUMMARY  Visits from Start of Care: eval and 12 treatments  Current functional level related to goals / functional outcomes: See above   Remaining deficits: See above   Education / Equipment: HEP   Patient agrees to discharge. Patient goals were partially met. Patient is being discharged due to pt is being transferred to work hardening.

## 2024-04-26 ENCOUNTER — Encounter: Admitting: Rehabilitative and Restorative Service Providers"

## 2024-05-01 ENCOUNTER — Ambulatory Visit (INDEPENDENT_AMBULATORY_CARE_PROVIDER_SITE_OTHER): Payer: Worker's Compensation | Admitting: Orthopaedic Surgery

## 2024-05-01 DIAGNOSIS — Z9889 Other specified postprocedural states: Secondary | ICD-10-CM | POA: Diagnosis not present

## 2024-05-01 NOTE — Progress Notes (Signed)
   Post-Op Visit Note   Patient: Andrea Hood           Date of Birth: 1976-06-18           MRN: [id number] Visit Date: 05/01/2024 PCP: Laurice Anes, MD   Assessment & Plan:  Chief Complaint:  Chief Complaint  Patient presents with   Left Shoulder - Pain    Left shoulder scope 11/17/2023     Visit Diagnoses:  1. S/P arthroscopy of left shoulder     Plan: History of Present Illness The patient presents with ongoing issues following physical therapy for shoulder rehabilitation.  They have improved since starting physical therapy but still face limitations. They can lift only about ten pounds. Weakness and tingling in two fingers affect grip strength, as evidenced by nearly dropping a jar. They cannot fully raise their arm above their head or fasten their bra, though they are getting closer to achieving these movements. Work conditioning has not yet been initiated.  Physical Exam MUSCULOSKELETAL: Shoulder scar well healed. Shoulder flexibility normal.  Manual muscle testing shows no focal deficits.  Assessment and Plan 5 months status post left shoulder scope biceps tenodesis Persistent shoulder weakness and limited range of motion despite physical therapy. Scar well healed, muscle function satisfactory. Work conditioning not started. - Initiate work Product manager. - Perform Functional Capacity Evaluation if no improvement after one month to determine restrictions. - Advise sedentary work with lifting limit of ten pounds. - Provide work note for sedentary work. - Case manager present today.  Follow-Up Instructions: No follow-ups on file.   Orders:  Orders Placed This Encounter  Procedures   Ambulatory referral to Physical Therapy   No orders of the defined types were placed in this encounter.   Imaging: No results found.  PMFS History: Patient Active Problem List   Diagnosis Date Noted   Pain in left shoulder 08/02/2023   Backache 11/19/2010    INSOMNIA-SLEEP DISORDER-UNSPEC 08/27/2010   Anxiety state 03/24/2009   EATING DISORDER, UNSPECIFIED 03/24/2009   DEPRESSION 03/24/2009   Migraine headache 03/24/2009   GERD 03/24/2009   Gastric ulcer 03/24/2009   FATIGUE 03/24/2009   Past Medical History:  Diagnosis Date   Asthma    Bipolar 2 disorder (HCC)    BRCA negative 12/2019   MyRisk neg; IBIS=13.4%/riskscore=25.3%   Depression    Family history of breast cancer    Family history of ovarian cancer    Fatty liver    Gastric ulcer    Migraines    Pituitary cyst (HCC)    Seizures (HCC)     Family History  Problem Relation Age of Onset   Cervical cancer Mother 69   Ovarian cancer Mother 48   Breast cancer Maternal Grandmother 76   Breast cancer Maternal Aunt 35    Past Surgical History:  Procedure Laterality Date   ABDOMINAL HYSTERECTOMY     CHOLECYSTECTOMY     Social History   Occupational History   Not on file  Tobacco Use   Smoking status: Former    Current packs/day: 0.50    Types: Cigarettes   Smokeless tobacco: Never  Vaping Use   Vaping status: Never Used  Substance and Sexual Activity   Alcohol use: Not Currently   Drug use: No   Sexual activity: Yes    Birth control/protection: Surgical

## 2024-06-27 ENCOUNTER — Encounter: Payer: Self-pay | Admitting: Orthopaedic Surgery

## 2024-06-27 ENCOUNTER — Ambulatory Visit (INDEPENDENT_AMBULATORY_CARE_PROVIDER_SITE_OTHER): Payer: Worker's Compensation | Admitting: Orthopaedic Surgery

## 2024-06-27 ENCOUNTER — Other Ambulatory Visit: Payer: Self-pay | Admitting: Orthopaedic Surgery

## 2024-06-27 DIAGNOSIS — M25512 Pain in left shoulder: Secondary | ICD-10-CM

## 2024-06-27 DIAGNOSIS — G8929 Other chronic pain: Secondary | ICD-10-CM

## 2024-06-27 DIAGNOSIS — Z9889 Other specified postprocedural states: Secondary | ICD-10-CM | POA: Diagnosis not present

## 2024-06-27 MED ORDER — METHYLPREDNISOLONE 4 MG PO TBPK: ORAL_TABLET | ORAL | 0 refills | Status: AC

## 2024-06-27 MED ORDER — METHOCARBAMOL 750 MG PO TABS: 750.0000 mg | ORAL_TABLET | Freq: Two times a day (BID) | ORAL | 3 refills | Status: AC | PRN

## 2024-06-27 NOTE — Progress Notes (Signed)
   Office Visit Note   Patient: Andrea Hood           Date of Birth: Mar 09, 1976           MRN: [id number] Visit Date: 06/27/2024              Requested by: Laurice Anes, MD [address] [facility] [place],  [place] [address] PCP: Laurice Anes, MD   Assessment & Plan: Visit Diagnoses:  1. S/P arthroscopy of left shoulder   2. Chronic left shoulder pain     Plan: History of Present Illness [name] is a 48 year old female who presents with increased shoulder pain following work conditioning. She was referred by her nurse case manager for evaluation of shoulder pain.  Seven months post shoulder arthroscopy with a biceps tenodesis for SLAP tear, she initially experienced improvement but now has significant shoulder pain, rated eight on the pain scale, with tenderness. The pain increased after starting work conditioning exercises.  She completed 2-1/2 weeks of work conditioning.  She describes the pain as burning and persistent, affecting her ability to sleep on the affected shoulder. She adheres to lifting restrictions of no more than five pounds during work conditioning, yet her pain has worsened, leading to the suspension of her program. Tylenol , Motrin , icing, and heating have not provided relief. No new activities or injuries are reported.  Physical Exam MUSCULOSKELETAL: Shoulder external rotation to 55 degrees, abduction greater than 90 degrees, forward flexion to 125 degrees with pain and guarding.  Manual muscle testing of rotator cuff is normal.  Assessment and Plan Left shoulder pain status post arthroscopy biceps tenodesis Seven months post-arthroscopy with exacerbated pain likely due to work conditioning. No structural damage suspected. Current analgesics ineffective. - Administer glenohumeral cortisone injection to left shoulder.  Will place order to be performed by Dr. Burnetta - Suspend work conditioning temporarily. - Resume work conditioning  2 weeks after injection. - Plan for Functional Capacity Evaluation after completion of remaining work conditioning.  Total face to face encounter time was greater than 25 minutes and over half of this time was spent in counseling and/or coordination of care.  Follow-Up Instructions: No follow-ups on file.   Orders:  Orders Placed This Encounter  Procedures   AMB referral to sports medicine   No orders of the defined types were placed in this encounter.    Subjective: Chief Complaint  Patient presents with   Left Shoulder - Follow-up    Left shoulder scope 11/17/2023

## 2024-06-27 NOTE — Telephone Encounter (Signed)
 I refilled robaxin  and sent medrol  dose pack for the pain.

## 2024-07-06 ENCOUNTER — Ambulatory Visit (INDEPENDENT_AMBULATORY_CARE_PROVIDER_SITE_OTHER): Payer: Worker's Compensation | Admitting: Sports Medicine

## 2024-07-06 ENCOUNTER — Other Ambulatory Visit: Payer: Self-pay

## 2024-07-06 ENCOUNTER — Encounter: Payer: Self-pay | Admitting: Sports Medicine

## 2024-07-06 DIAGNOSIS — G8929 Other chronic pain: Secondary | ICD-10-CM | POA: Diagnosis not present

## 2024-07-06 DIAGNOSIS — M25512 Pain in left shoulder: Secondary | ICD-10-CM

## 2024-07-06 MED ORDER — LIDOCAINE HCL 1 % IJ SOLN
2.0000 mL | INTRAMUSCULAR | Status: AC | PRN
  Administered 2024-07-06: 2 mL

## 2024-07-06 MED ORDER — METHYLPREDNISOLONE ACETATE 40 MG/ML IJ SUSP
80.0000 mg | INTRAMUSCULAR | Status: AC | PRN
  Administered 2024-07-06: 80 mg via INTRA_ARTICULAR

## 2024-07-06 MED ORDER — BUPIVACAINE HCL 0.25 % IJ SOLN
2.0000 mL | INTRAMUSCULAR | Status: AC | PRN
  Administered 2024-07-06: 2 mL via INTRA_ARTICULAR

## 2024-07-06 NOTE — Progress Notes (Signed)
   Procedure Note  Patient: Andrea Hood             Date of Birth: [birth year]           MRN: [id number]             Visit Date: 07/06/2024  Procedures: Visit Diagnoses:  1. Chronic left shoulder pain    Large Joint Inj: L glenohumeral on 07/06/2024 9:36 AM Indications: pain Details: 22 G 3.5 in needle, ultrasound-guided posterior approach Medications: 2 mL lidocaine  1 %; 2 mL bupivacaine  0.25 %; 80 mg methylPREDNISolone  acetate 40 MG/ML Outcome: tolerated well, no immediate complications  US -guided glenohumeral joint injection, left shoulder After discussion on risks/benefits/indications, informed verbal consent was obtained. A timeout was then performed. The patient was positioned lying lateral recumbent on examination table. The patient's shoulder was prepped with betadine and multiple alcohol swabs and utilizing ultrasound guidance, the patient's glenohumeral joint was identified on ultrasound. Using ultrasound guidance a 22-gauge, 3.5 inch needle with a mixture of 2:2:2 cc's lidocaine :bupivicaine:depomedrol was directed from a lateral to medial direction via in-plane technique into the glenohumeral joint with visualization of appropriate spread of injectate into the joint. Patient tolerated the procedure well without immediate complications.      Procedure, treatment alternatives, risks and benefits explained, specific risks discussed. Consent was given by the patient. Immediately prior to procedure a time out was called to verify the correct patient, procedure, equipment, support staff and site/side marked as required. Patient was prepped and draped in the usual sterile fashion.     - patient tolerated procedure well, discussed post-injection protocol - follow-up with Dr. Jerri as indicated; I am happy to see them as needed  Lonell Sprang, DO Primary Care Sports Medicine Physician  [facility] - [facility]oped[facility]  This note was dictated using Dragon naturally speaking software  and may contain errors in syntax, spelling, or content which have not been identified prior to signing this note.

## 2024-08-20 ENCOUNTER — Encounter: Payer: Self-pay | Admitting: Radiology

## 2024-08-28 ENCOUNTER — Ambulatory Visit (INDEPENDENT_AMBULATORY_CARE_PROVIDER_SITE_OTHER): Payer: Worker's Compensation | Admitting: Orthopaedic Surgery

## 2024-08-28 ENCOUNTER — Encounter: Payer: Self-pay | Admitting: Orthopaedic Surgery

## 2024-08-28 DIAGNOSIS — S43432A Superior glenoid labrum lesion of left shoulder, initial encounter: Secondary | ICD-10-CM

## 2024-08-28 DIAGNOSIS — Z9889 Other specified postprocedural states: Secondary | ICD-10-CM | POA: Diagnosis not present

## 2024-08-28 DIAGNOSIS — M25512 Pain in left shoulder: Secondary | ICD-10-CM | POA: Diagnosis not present

## 2024-08-28 DIAGNOSIS — G8929 Other chronic pain: Secondary | ICD-10-CM

## 2024-08-28 NOTE — Progress Notes (Signed)
   Office Visit Note   Patient: Andrea Hood           Date of Birth: 08-29-1976           MRN: [id number] Visit Date: 08/28/2024              Requested by: Laurice Anes, MD [address] [facility] [place],  [place] [address] PCP: Laurice Anes, MD   Assessment & Plan: Visit Diagnoses:  1. Chronic left shoulder pain   2. S/P arthroscopy of left shoulder   3. Superior labrum anterior-to-posterior (SLAP) tear of left shoulder     Plan: FCE report is valid and reliable.  Specific restrictions were reviewed with the patient.  At this point she is at [facility] and she is released to work based on the [employer] restrictions.  Her rating is 10% to the left shoulder for the surgery.  Caseworker present.  Questions encouraged and answered.  Follow-up as needed.  Total face to face encounter time was greater than 25 minutes and over half of this time was spent in counseling and/or coordination of care.  Follow-Up Instructions: No follow-ups on file.   Orders:  No orders of the defined types were placed in this encounter.  No orders of the defined types were placed in this encounter.   Subjective: Chief Complaint  Patient presents with   Left Shoulder - Follow-up    HPI Andrea Hood returns today for follow-up of left shoulder pain and review of FCE.  She is here for right release.  Ortho Exam Examination of left shoulder is unchanged from prior visit. Specialty Comments:  No specialty comments available.  Imaging: No results found.   PMFS History: Patient Active Problem List   Diagnosis Date Noted   Pain in left shoulder 08/02/2023   Backache 11/19/2010   INSOMNIA-SLEEP DISORDER-UNSPEC 08/27/2010   Anxiety state 03/24/2009   EATING DISORDER, UNSPECIFIED 03/24/2009   DEPRESSION 03/24/2009   Migraine headache 03/24/2009   GERD 03/24/2009   Gastric ulcer 03/24/2009   FATIGUE 03/24/2009   Past Medical History:  Diagnosis Date   Asthma    Bipolar 2 disorder  (HCC)    BRCA negative 12/2019   MyRisk neg; IBIS=13.4%/riskscore=25.3%   Depression    Family history of breast cancer    Family history of ovarian cancer    Fatty liver    Gastric ulcer    Migraines    Pituitary cyst    Seizures (HCC)     Family History  Problem Relation Age of Onset   Cervical cancer Mother 68   Ovarian cancer Mother 39   Breast cancer Maternal Grandmother 46   Breast cancer Maternal Aunt 35    Past Surgical History:  Procedure Laterality Date   ABDOMINAL HYSTERECTOMY     CHOLECYSTECTOMY     Social History   Occupational History   Not on file  Tobacco Use   Smoking status: Former    Current packs/day: 0.50    Types: Cigarettes   Smokeless tobacco: Never  Vaping Use   Vaping status: Never Used  Substance and Sexual Activity   Alcohol use: Not Currently   Drug use: No   Sexual activity: Yes    Birth control/protection: Surgical
